# Patient Record
Sex: Male | Born: 1980
Health system: Southern US, Community
[De-identification: ages and names within clinical notes are randomized; demographics above are authoritative.]

## PROBLEM LIST (undated history)

## (undated) DIAGNOSIS — E8809 Other disorders of plasma-protein metabolism, not elsewhere classified: Secondary | ICD-10-CM

## (undated) DIAGNOSIS — T7840XA Allergy, unspecified, initial encounter: Secondary | ICD-10-CM

## (undated) DIAGNOSIS — G4730 Sleep apnea, unspecified: Secondary | ICD-10-CM

## (undated) DIAGNOSIS — D649 Anemia, unspecified: Secondary | ICD-10-CM

## (undated) DIAGNOSIS — R0602 Shortness of breath: Secondary | ICD-10-CM

## (undated) DIAGNOSIS — F1010 Alcohol abuse, uncomplicated: Secondary | ICD-10-CM

## (undated) DIAGNOSIS — K219 Gastro-esophageal reflux disease without esophagitis: Secondary | ICD-10-CM

## (undated) DIAGNOSIS — K760 Fatty (change of) liver, not elsewhere classified: Secondary | ICD-10-CM

## (undated) DIAGNOSIS — F419 Anxiety disorder, unspecified: Secondary | ICD-10-CM

## (undated) DIAGNOSIS — E612 Magnesium deficiency: Secondary | ICD-10-CM

## (undated) DIAGNOSIS — R002 Palpitations: Secondary | ICD-10-CM

## (undated) DIAGNOSIS — E559 Vitamin D deficiency, unspecified: Secondary | ICD-10-CM

## (undated) DIAGNOSIS — I499 Cardiac arrhythmia, unspecified: Secondary | ICD-10-CM

## (undated) HISTORY — DX: Anemia, unspecified: D64.9

## (undated) HISTORY — DX: Shortness of breath: R06.02

## (undated) HISTORY — DX: Vitamin D deficiency, unspecified: E55.9

## (undated) HISTORY — DX: Fatty (change of) liver, not elsewhere classified: K76.0

## (undated) HISTORY — DX: Sleep apnea, unspecified: G47.30

## (undated) HISTORY — DX: Anxiety disorder, unspecified: F41.9

## (undated) HISTORY — DX: Allergy, unspecified, initial encounter: T78.40XA

## (undated) HISTORY — PX: PILONIDAL CYST EXCISION: SHX744

## (undated) HISTORY — PX: ORCHIECTOMY: SHX2116

## (undated) HISTORY — DX: Palpitations: R00.2

## (undated) HISTORY — DX: Gastro-esophageal reflux disease without esophagitis: K21.9

## (undated) HISTORY — DX: Magnesium deficiency: E61.2

## (undated) HISTORY — DX: Alcohol abuse, uncomplicated: F10.10

## (undated) HISTORY — DX: Other disorders of plasma-protein metabolism, not elsewhere classified: E88.09

---

## 2006-04-24 ENCOUNTER — Ambulatory Visit: Payer: Self-pay | Admitting: Family Medicine

## 2006-08-29 ENCOUNTER — Emergency Department: Payer: Self-pay | Admitting: General Practice

## 2007-02-27 ENCOUNTER — Ambulatory Visit: Payer: Self-pay | Admitting: Internal Medicine

## 2007-02-27 DIAGNOSIS — J45909 Unspecified asthma, uncomplicated: Secondary | ICD-10-CM | POA: Insufficient documentation

## 2007-02-27 DIAGNOSIS — J309 Allergic rhinitis, unspecified: Secondary | ICD-10-CM | POA: Insufficient documentation

## 2007-02-28 LAB — CONVERTED CEMR LAB
ALT: 27 units/L (ref 0–53)
Alkaline Phosphatase: 57 units/L (ref 39–117)
Basophils Relative: 1.4 % — ABNORMAL HIGH (ref 0.0–1.0)
Chloride: 100 meq/L (ref 96–112)
Eosinophils Absolute: 0.1 10*3/uL (ref 0.0–0.6)
Eosinophils Relative: 1.5 % (ref 0.0–5.0)
GFR calc Af Amer: 132 mL/min
Glucose, Bld: 87 mg/dL (ref 70–99)
MCV: 94.2 fL (ref 78.0–100.0)
Platelets: 229 10*3/uL (ref 150–400)
RBC: 4.47 M/uL (ref 4.22–5.81)
Sodium: 134 meq/L — ABNORMAL LOW (ref 135–145)
Total Protein: 7.6 g/dL (ref 6.0–8.3)
WBC: 7.1 10*3/uL (ref 4.5–10.5)

## 2007-03-26 ENCOUNTER — Emergency Department: Payer: Self-pay | Admitting: Emergency Medicine

## 2007-03-29 ENCOUNTER — Ambulatory Visit: Payer: Self-pay | Admitting: Internal Medicine

## 2007-04-27 ENCOUNTER — Ambulatory Visit: Payer: Self-pay | Admitting: Family Medicine

## 2007-04-30 ENCOUNTER — Ambulatory Visit: Payer: Self-pay | Admitting: Internal Medicine

## 2007-04-30 DIAGNOSIS — F411 Generalized anxiety disorder: Secondary | ICD-10-CM | POA: Insufficient documentation

## 2007-05-01 ENCOUNTER — Encounter: Payer: Self-pay | Admitting: Internal Medicine

## 2007-06-29 ENCOUNTER — Ambulatory Visit: Payer: Self-pay | Admitting: Family Medicine

## 2007-10-30 ENCOUNTER — Ambulatory Visit: Payer: Self-pay | Admitting: Internal Medicine

## 2008-04-01 ENCOUNTER — Ambulatory Visit: Payer: Self-pay | Admitting: Family Medicine

## 2008-04-22 ENCOUNTER — Encounter: Payer: Self-pay | Admitting: Family Medicine

## 2008-05-06 ENCOUNTER — Ambulatory Visit: Payer: Self-pay | Admitting: Internal Medicine

## 2008-08-26 ENCOUNTER — Ambulatory Visit: Payer: Self-pay | Admitting: Family Medicine

## 2008-08-26 DIAGNOSIS — H6120 Impacted cerumen, unspecified ear: Secondary | ICD-10-CM | POA: Insufficient documentation

## 2009-10-06 ENCOUNTER — Ambulatory Visit: Payer: Self-pay | Admitting: Family Medicine

## 2009-12-24 ENCOUNTER — Ambulatory Visit: Payer: Self-pay | Admitting: Internal Medicine

## 2009-12-24 DIAGNOSIS — L723 Sebaceous cyst: Secondary | ICD-10-CM | POA: Insufficient documentation

## 2010-09-08 NOTE — Assessment & Plan Note (Signed)
Summary: CPX/CLE   Vital Signs:  Patient profile:   30 year old male Height:      74 inches Weight:      226 pounds Temp:     98.1 degrees F oral Pulse rate:   72 / minute Pulse rhythm:   regular BP sitting:   112 / 70  (left arm) Cuff size:   large  Vitals Entered By: Mervin Hack CMA Duncan Dull) (Dec 24, 2009 3:02 PM) CC: adult physical   History of Present Illness: Doing well For physical  Allegies have not been a problem Asthma quiet---hasn't used inhaler in years no limitations on exercise tolerance  Headaches have dissapated Only rarely will get a headache   Allergies: No Known Drug Allergies  Past History:  Past medical, surgical, family and social histories (including risk factors) reviewed for relevance to current acute and chronic problems.  Past Medical History: Reviewed history from 04/30/2007 and no changes required. Allergic rhinitis Asthma Anxiety GERD  Past Surgical History: Reviewed history from 02/27/2007 and no changes required. Pilonidal cyst excision 3 times---2000 & 2001 L orchiectomy and R orchiopexy for torsion   2002  Family History: Parents both with headaches--Dad with migraines Dad had trigeminal neuralgia Dad has celiac disease 1 sister Mat GF had PVD, HTN (died of CAD?) No DM Pat GM died of some cancer No prostate or colon cancer  Social History: Reviewed history from 02/27/2007 and no changes required. 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 Eyes:  Denies double vision and vision loss-1 eye. ENT:  Denies decreased hearing and ringing in ears; teeth fine--regular with dentist. CV:  Denies chest pain or discomfort, difficulty breathing at night, difficulty breathing while lying down, fainting, lightheadness, palpitations, and shortness of breath  with exertion. Resp:  Denies cough and shortness of breath. GI:  Denies abdominal pain, bloody stools, change in bowel habits, dark tarry stools, indigestion, nausea, and vomiting. GU:  Denies erectile dysfunction, urinary frequency, and urinary hesitancy. MS:  Denies joint pain and joint swelling. Derm:  Complains of lesion(s); denies rash; has slowly enlarging "lump" along right flank. Has been there for many years. Neuro:  Denies headaches, numbness, tingling, and weakness. Psych:  Denies anxiety and depression. Endo:  Denies excessive thirst and excessive urination. Heme:  Denies abnormal bruising and enlarge lymph nodes. Allergy:  Denies seasonal allergies and sneezing; allergies have been quiet.  Physical Exam  General:  alert and normal appearance.   Eyes:  pupils equal, pupils round, pupils reactive to light, and no optic disk abnormalities.   Ears:  Cerumen bilaterally Mouth:  no erythema and no exudates.   Neck:  supple, no masses, no thyromegaly, no carotid bruits, and no cervical lymphadenopathy.   Lungs:  normal respiratory effort and normal breath sounds.   Heart:  normal rate, regular rhythm, no murmur, and no gallop.   Abdomen:  soft and non-tender.   Genitalia:  left testes absent  right is normal Msk:  no joint tenderness and no joint swelling.   Pulses:  normal in feet Extremities:  no edema Neurologic:  alert & oriented X3, strength normal in all extremities, and gait normal.   Skin:  no suspicious lesions and no ulcerations.   raised cyst ( ~2cm in diameter) along right flank. Slight redness at  point with crust but no infeciton Axillary  Nodes:  No palpable lymphadenopathy Psych:  normally interactive, good eye contact, not anxious appearing, and not depressed appearing.     Impression & Recommendations:  Problem # 1:  PREVENTIVE HEALTH CARE (ICD-V70.0) Assessment Comment Only healthy needs to pay more attention to fitness again Will check tetanus  status--not excited about getting it asthma and allergies quiet  Problem # 2:  SEBACEOUS CYST (ICD-706.2) Assessment: Comment Only discussed hot packing to drain if it gets infected can schedule for excision if it bothers him  Patient Instructions: 1)  Please check on your tetanus status. If you are not sure, or if it has been over 10 years, please set up to come in for Tdap. 2)  Please schedule a follow-up appointment in 2-3 years for a physical  Prior Medications: None Current Allergies (reviewed today): No known allergies

## 2010-09-08 NOTE — Assessment & Plan Note (Signed)
Summary: ears stopped up/ alc   Vital Signs:  Patient profile:   30 year old male Height:      74 inches Weight:      226.6 pounds BMI:     29.20 Temp:     98.0 degrees F oral Pulse rate:   80 / minute Pulse rhythm:   regular BP sitting:   110 / 80  (left arm) Cuff size:   large  Vitals Entered By: Benny Lennert CMA Duncan Dull) (October 06, 2009 11:42 AM)  History of Present Illness: Chief complaint ears stopped up  B ears full...no URI symptoms, no SOB, no headache Decrease hearing B ears. Cerumen impaction last year this time. Irrigation helped.  not using Q-tips.   Allergies (verified): No Known Drug Allergies PMH-FH-SH reviewed-no changes except otherwise noted  Review of Systems General:  Denies fatigue and fever.  Physical Exam  General:  Well-developed,well-nourished,in no acute distress; alert,appropriate and cooperative throughout examination Ears:  B cerumen...after irrigation TMs B clear, ext canals clear Nose:  External nasal examination shows no deformity or inflammation. Nasal mucosa are pink and moist without lesions or exudates. Mouth:  MMM   Impression & Recommendations:  Problem # 1:  CERUMEN IMPACTION, BILATERAL (ICD-380.4) Discussed cerumen impaction prevention.   Prior Medications (reviewed today): None Current Allergies (reviewed today): No known allergies

## 2011-02-15 ENCOUNTER — Ambulatory Visit (INDEPENDENT_AMBULATORY_CARE_PROVIDER_SITE_OTHER): Payer: BC Managed Care – PPO | Admitting: Internal Medicine

## 2011-02-15 ENCOUNTER — Encounter: Payer: Self-pay | Admitting: Internal Medicine

## 2011-02-15 VITALS — BP 106/60 | HR 60 | Temp 97.8°F | Ht 74.0 in | Wt 182.0 lb

## 2011-02-15 DIAGNOSIS — M25519 Pain in unspecified shoulder: Secondary | ICD-10-CM | POA: Insufficient documentation

## 2011-02-15 NOTE — Assessment & Plan Note (Signed)
Probably did some strain working with the trainer Discussed that no serious findings like impingement or apparent rotator cuff damage are present Pain with stretching may be keeping him from further ROM but should try to cater stretching to prevent worst of pain Probably needs to limit shoulder weights--he will likely never be able to build up a lot more Advised appt with Dr Patsy Lager if ongoing problems

## 2011-02-15 NOTE — Progress Notes (Signed)
  Subjective:    Patient ID: William Stuart, male    DOB: 01/05/1981, 30 y.o.   MRN: [id number]  HPI Having shoulder problems Wonders about injury and whether he needs MRI Left >>R problems Several months of symptoms (3) Has been working out with trainer---lifting more with shoulders than he ever did before Has lightened up on the weights Some stiffness on left, slight on right---feels he lacks flexibility  Shooting pains in gym when stretching --then will often loosen up No apparent swelling  Can feel it when he does regular lifting (like groceries)  Hasn't used any meds Has just tried various stretches  No current outpatient prescriptions on file prior to visit.    No Known Allergies  Past Medical History  Diagnosis Date  . Allergy   . Asthma   . Anxiety   . GERD (gastroesophageal reflux disease)     Past Surgical History  Procedure Date  . Pilonidal cyst excision 2000 & 2001    3 times  . Orchiectomy     L orchiectomy and R orchiopexy for torsion   2002    Family History  Problem Relation Age of Onset  . Migraines Mother   . Migraines Father   . Cancer Neg Hx     History   Social History  . Marital Status: Married    Spouse Name: N/A    Number of Children: N/A  . Years of Education: N/A   Occupational History  . 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 Smoking status: Never Smoker   . Smokeless tobacco: Never Used  . Alcohol Use: Yes  . Drug Use: No  . Sexually Active: Not on file   Other Topics Concern  . Not on file   Social History Narrative  . No narrative on file   Review of Systems No muscle weakness No joint pains otherwise     Objective:   Physical Exam  Constitutional: He appears well-developed and well-nourished. No distress.  Musculoskeletal:       Fairly normal ROM in both shoulders Mild crepitus on left abd and ext rotation are low normal bilat   Neurological:         Equal fair strength in both shoulders --resist adduction fairly well          Assessment & Plan:

## 2011-05-24 ENCOUNTER — Encounter: Payer: Self-pay | Admitting: Internal Medicine

## 2011-05-24 ENCOUNTER — Ambulatory Visit (INDEPENDENT_AMBULATORY_CARE_PROVIDER_SITE_OTHER): Payer: BC Managed Care – PPO | Admitting: Internal Medicine

## 2011-05-24 DIAGNOSIS — R1900 Intra-abdominal and pelvic swelling, mass and lump, unspecified site: Secondary | ICD-10-CM

## 2011-05-24 NOTE — Progress Notes (Signed)
  Subjective:    Patient ID: William Stuart, male    DOB: January 07, 1981, 30 y.o.   MRN: [id number]  HPI Has noticed 2 days ago---a lump under his belly button No pain but a little tender with pushing No apparent bulging out  No abdominal pain Appetite is good Bowels are fine  No current outpatient prescriptions on file prior to visit.    No Known Allergies  Past Medical History  Diagnosis Date  . Allergy   . Asthma   . Anxiety   . GERD (gastroesophageal reflux disease)     Past Surgical History  Procedure Date  . Pilonidal cyst excision 2000 & 2001    3 times  . Orchiectomy     L orchiectomy and R orchiopexy for torsion   2002    Family History  Problem Relation Age of Onset  . Migraines Mother   . Migraines Father   . Cancer Neg Hx     History   Social History  . Marital Status: Married    Spouse Name: N/A    Number of Children: N/A  . Years of Education: N/A   Occupational History  . 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 Smoking status: Never Smoker   . Smokeless tobacco: Never Used  . Alcohol Use: Yes  . Drug Use: No  . Sexually Active: Not on file   Other Topics Concern  . Not on file   Social History Narrative  . No narrative on file   Review of Systems Still working out regularly Shoulder pain is better     Objective:   Physical Exam  Constitutional: He appears well-developed and well-nourished. No distress.  Neck: Normal range of motion. Neck supple. No thyromegaly present.  Cardiovascular: Normal rate, regular rhythm and normal heart sounds.   No murmur heard. Pulmonary/Chest: Effort normal and breath sounds normal. No respiratory distress. He has no wheezes. He has no rales.  Abdominal: Soft. Bowel sounds are normal. He exhibits no distension and no mass. There is no tenderness. There is no rebound and no guarding.       Rectus muscles together in midline when upright When  supine--no hernia or apparent mass No inflammation or redness  Lymphadenopathy:    He has no cervical adenopathy.          Assessment & Plan:

## 2011-05-24 NOTE — Assessment & Plan Note (Signed)
No clear finding No umbilical hernia or infection  Reassured  Observation only

## 2011-09-15 ENCOUNTER — Ambulatory Visit: Payer: BC Managed Care – PPO | Admitting: Internal Medicine

## 2011-09-29 ENCOUNTER — Ambulatory Visit: Payer: BC Managed Care – PPO | Admitting: Family Medicine

## 2011-09-29 ENCOUNTER — Encounter: Payer: Self-pay | Admitting: Internal Medicine

## 2011-09-29 ENCOUNTER — Ambulatory Visit (INDEPENDENT_AMBULATORY_CARE_PROVIDER_SITE_OTHER): Payer: BC Managed Care – PPO | Admitting: Internal Medicine

## 2011-09-29 DIAGNOSIS — H6120 Impacted cerumen, unspecified ear: Secondary | ICD-10-CM

## 2011-09-29 NOTE — Assessment & Plan Note (Signed)
Hearing is better with the cleaning that has been done Discussed using peroxide solution nightly for a few nights then warm water flush

## 2011-09-29 NOTE — Progress Notes (Signed)
  Subjective:    Patient ID: William Stuart, male    DOB: 1981-07-29, 31 y.o.   MRN: [id number]  HPI Felt his ears filled up---esp left ear Goes back 3-4 days Right is milder No pain No URI now---may have had flu earlier in month (fever, cough, chills) No fever  No current outpatient prescriptions on file prior to visit.    No Known Allergies  Past Medical History  Diagnosis Date  . Allergy   . Asthma   . Anxiety   . GERD (gastroesophageal reflux disease)     Past Surgical History  Procedure Date  . Pilonidal cyst excision 2000 & 2001    3 times  . Orchiectomy     L orchiectomy and R orchiopexy for torsion   2002    Family History  Problem Relation Age of Onset  . Migraines Mother   . Migraines Father   . Cancer Neg Hx     History   Social History  . Marital Status: Married    Spouse Name: N/A    Number of Children: N/A  . Years of Education: N/A   Occupational History  . 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 Smoking status: Never Smoker   . Smokeless tobacco: Never Used  . Alcohol Use: Yes  . Drug Use: No  . Sexually Active: Not on file   Other Topics Concern  . Not on file   Social History Narrative  . No narrative on file     Review of Systems No vomiting  No tooth pain Appetite fine     Objective:   Physical Exam  Constitutional: He appears well-developed and well-nourished. No distress.  HENT:  Mouth/Throat: Oropharynx is clear and moist. No oropharyngeal exudate.       No tragal tenderness Canals are full of cerumen ~75%clearing with lavage but cerumen still present bilaterally at TM  Neck: Normal range of motion.  Lymphadenopathy:    He has no cervical adenopathy.          Assessment & Plan:

## 2013-01-24 ENCOUNTER — Ambulatory Visit (INDEPENDENT_AMBULATORY_CARE_PROVIDER_SITE_OTHER): Payer: BC Managed Care – PPO | Admitting: Family Medicine

## 2013-01-24 ENCOUNTER — Encounter: Payer: Self-pay | Admitting: Family Medicine

## 2013-01-24 VITALS — BP 108/68 | HR 70 | Temp 97.8°F | Wt 207.5 lb

## 2013-01-24 DIAGNOSIS — F411 Generalized anxiety disorder: Secondary | ICD-10-CM

## 2013-01-24 MED ORDER — ALPRAZOLAM 0.25 MG PO TABS: 0.2500 mg | ORAL_TABLET | Freq: Two times a day (BID) | ORAL | Status: DC | PRN

## 2013-01-24 NOTE — Patient Instructions (Addendum)
Drink plenty of water and use the xanax if needed.  Give [place] an update in a few days.  Take care.

## 2013-01-24 NOTE — Progress Notes (Signed)
Was at work two days ago.  Felt a shortness of breath w/o clear trigger.  Lasted through yesterday (about half of yesterday he felt well) and some today.  Sx can come on during sleep at night, waking up with it.  No FCNAVD. No vision loss.  No cough.  No syncope. No speech changes. No weakness x4.  No rash.  No wheeze.  H/o asthma, not on inhalers now.  This may be similar to feeling of asthma, except for the lightheaded feeling.  No vertigo.  Work is going well, lighter load during the summer.  Married, new baby on the way in 7/14.  No acute chest pain.  H/o anxiety prev.  Was prev on xanax, used rarely.  Had been exercising recently, with good exercise tolerance.  He gets a little lightheaded with position changes.    He had had similar prev, when his weight was much higher, ~270 lbs prev.  He feels "okay" now, not as SOB now.    Meds, vitals, and allergies reviewed.   ROS: See HPI.  Otherwise, noncontributory.  GEN: nad, alert and oriented HEENT: mucous membranes moist NECK: supple w/o LA CV: rrr.  no murmur PULM: ctab, no inc wob ABD: soft, +bs EXT: no edema SKIN: no acute rash Not orthostatic on position change in clinic

## 2013-01-25 NOTE — Assessment & Plan Note (Signed)
Likely cause.  He has no h/o or FH or CAD.  Nontoxic.  No typical CP. Totally normal exam.  No sign of cardiopulmonary disease.  Noted pending birth of first child. Would try short course of BZD with sedation caution and then f/u is sx persist.  He agrees. ddx dw pt.

## 2013-06-18 ENCOUNTER — Encounter: Payer: Self-pay | Admitting: Internal Medicine

## 2013-06-18 ENCOUNTER — Telehealth: Payer: Self-pay

## 2013-06-18 ENCOUNTER — Ambulatory Visit (INDEPENDENT_AMBULATORY_CARE_PROVIDER_SITE_OTHER): Payer: BC Managed Care – PPO | Admitting: Internal Medicine

## 2013-06-18 VITALS — BP 122/80 | HR 56 | Temp 98.6°F | Wt 215.0 lb

## 2013-06-18 DIAGNOSIS — H6123 Impacted cerumen, bilateral: Secondary | ICD-10-CM | POA: Insufficient documentation

## 2013-06-18 DIAGNOSIS — R209 Unspecified disturbances of skin sensation: Secondary | ICD-10-CM

## 2013-06-18 DIAGNOSIS — H6120 Impacted cerumen, unspecified ear: Secondary | ICD-10-CM

## 2013-06-18 DIAGNOSIS — R200 Anesthesia of skin: Secondary | ICD-10-CM | POA: Insufficient documentation

## 2013-06-18 LAB — CBC WITH DIFFERENTIAL/PLATELET
Basophils Absolute: 0 10*3/uL (ref 0.0–0.1)
Eosinophils Absolute: 0.1 10*3/uL (ref 0.0–0.7)
HCT: 41.1 % (ref 39.0–52.0)
Hemoglobin: 14 g/dL (ref 13.0–17.0)
Lymphs Abs: 2.3 10*3/uL (ref 0.7–4.0)
MCHC: 34 g/dL (ref 30.0–36.0)
MCV: 95.2 fl (ref 78.0–100.0)
Neutro Abs: 3.2 10*3/uL (ref 1.4–7.7)
RDW: 12.5 % (ref 11.5–14.6)

## 2013-06-18 LAB — BASIC METABOLIC PANEL
CO2: 29 mEq/L (ref 19–32)
Chloride: 102 mEq/L (ref 96–112)
Glucose, Bld: 83 mg/dL (ref 70–99)
Potassium: 4 mEq/L (ref 3.5–5.1)
Sodium: 137 mEq/L (ref 135–145)

## 2013-06-18 LAB — HEPATIC FUNCTION PANEL
ALT: 15 U/L (ref 0–53)
Albumin: 4.3 g/dL (ref 3.5–5.2)
Total Protein: 7.8 g/dL (ref 6.0–8.3)

## 2013-06-18 NOTE — Telephone Encounter (Signed)
Pt left v/m was seen today and pt was asked what he had to eat prior to lab testing; pt said he forgot he had a granola bar at 11:45 and wanted Dr Alphonsus Sias to be aware.

## 2013-06-18 NOTE — Assessment & Plan Note (Signed)
May be due to compression with shoe Will just check labs to be sure

## 2013-06-18 NOTE — Assessment & Plan Note (Signed)
Flushed while here

## 2013-06-18 NOTE — Progress Notes (Signed)
  Subjective:    Patient ID: William Stuart, male    DOB: 10/11/1980, 32 y.o.   MRN: [id number]  HPI 4-5 days ago--awoke with numbness on lateral left foot and ankle Has abnormal sense when he touches it Still present but less noticeable No symptoms in leg above that. No back injuries or problems No other abnormal sensation No weakness  Also has some head congestion Slight lightheaded feeling Ears are clogged again Hearing is muffled Not really sick Does have some fall allergies-- usually doesn't take meds  No current outpatient prescriptions on file prior to visit.   No current facility-administered medications on file prior to visit.    No Known Allergies  Past Medical History  Diagnosis Date  . Allergy   . Asthma   . Anxiety   . GERD (gastroesophageal reflux disease)     Past Surgical History  Procedure Laterality Date  . Pilonidal cyst excision  2000 & 2001    3 times  . Orchiectomy      L orchiectomy and R orchiopexy for torsion   2002    Family History  Problem Relation Age of Onset  . Migraines Mother   . Migraines Father   . Cancer Neg Hx     History   Social History  . Marital Status: Married    Spouse Name: N/A    Number of Children: 1  . Years of Education: N/A   Occupational History  . 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 Smoking status: Never Smoker   . Smokeless tobacco: Never Used  . Alcohol Use: Yes  . Drug Use: No  . Sexual Activity: Not on file   Other Topics Concern  . Not on file   Social History Narrative  . No narrative on file   Review of Systems No fevers No bladder or bowel function Sleeps okay    Objective:   Physical Exam  Constitutional: He appears well-developed and well-nourished. No distress.  HENT:  No sinus tenderness Mild pale nasal congestion TMs blocked by cerumen bilaterally  Neck: Normal range of motion. Neck supple. No thyromegaly  present.  Pulmonary/Chest: Effort normal and breath sounds normal. No respiratory distress. He has no wheezes. He has no rales.  Lymphadenopathy:    He has no cervical adenopathy.  Neurological:  Mild difference in sensation on lateral left foot No other abnormal areas          Assessment & Plan:

## 2013-06-19 ENCOUNTER — Encounter: Payer: Self-pay | Admitting: Internal Medicine

## 2013-06-19 NOTE — Telephone Encounter (Signed)
I will keep that in mind if his glucose is elevated

## 2013-06-20 ENCOUNTER — Encounter: Payer: Self-pay | Admitting: Internal Medicine

## 2013-06-21 ENCOUNTER — Other Ambulatory Visit: Payer: Self-pay | Admitting: Internal Medicine

## 2013-06-21 DIAGNOSIS — R200 Anesthesia of skin: Secondary | ICD-10-CM

## 2014-05-23 ENCOUNTER — Other Ambulatory Visit: Payer: Self-pay

## 2014-08-05 ENCOUNTER — Encounter: Payer: Self-pay | Admitting: Internal Medicine

## 2014-08-05 ENCOUNTER — Ambulatory Visit (INDEPENDENT_AMBULATORY_CARE_PROVIDER_SITE_OTHER): Payer: BC Managed Care – PPO | Admitting: Internal Medicine

## 2014-08-05 VITALS — BP 120/80 | HR 81 | Temp 98.6°F | Wt 243.0 lb

## 2014-08-05 DIAGNOSIS — R51 Headache: Secondary | ICD-10-CM

## 2014-08-05 DIAGNOSIS — R519 Headache, unspecified: Secondary | ICD-10-CM | POA: Insufficient documentation

## 2014-08-05 NOTE — Patient Instructions (Signed)
Please try over the counter loratadine 10mg  1-2 daily, or once a day fexofenadine 180mg  or cetirizine 10mg .

## 2014-08-05 NOTE — Progress Notes (Signed)
   Subjective:    Patient ID: William Stuart, male    DOB: 08-15-1980, 33 y.o.   MRN: [id number]  HPI Here for evaluation of head pain  Awoke about 10 days ago with pressure in head Jumped up and got dizzy Mostly in frontal area and face Has recurred intermittently since then  Doesn't really feel sick Slight stuffiness No visual changes No sore throat Some pain in ears  Not clearly like past allergy symptoms Hasn't tried any meds lately--- did take 2 excedrin at first  No current outpatient prescriptions on file prior to visit.   No current facility-administered medications on file prior to visit.    No Known Allergies  Past Medical History  Diagnosis Date  . Allergy   . Asthma   . Anxiety   . GERD (gastroesophageal reflux disease)     Past Surgical History  Procedure Laterality Date  . Pilonidal cyst excision  2000 & 2001    3 times  . Orchiectomy      L orchiectomy and R orchiopexy for torsion   2002    Family History  Problem Relation Age of Onset  . Migraines Mother   . Migraines Father   . Cancer Neg Hx     History   Social History  . Marital Status: Married    Spouse Name: N/A    Number of Children: 1  . Years of Education: N/A   Occupational History  . 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 Smoking status: Never Smoker   . Smokeless tobacco: Never Used  . Alcohol Use: Yes  . Drug Use: No  . Sexual Activity: Not on file   Other Topics Concern  . Not on file   Social History Narrative   Review of Systems No focal weakness, aphasia or swallowing problems No fever Has gained 28# in past year--- stopped going back to gym    Objective:   Physical Exam  Constitutional: He is oriented to person, place, and time. He appears well-developed and well-nourished. No distress.  HENT:  Mouth/Throat: Oropharynx is clear and moist. No oropharyngeal exudate.  Cerumen in both  canals--TMs okay Mild nasal inflammation biilaterally  Eyes: Conjunctivae and EOM are normal. Pupils are equal, round, and reactive to light.  Fundi normal  Neck: Normal range of motion. Neck supple. No thyromegaly present.  Lymphadenopathy:    He has no cervical adenopathy.  Neurological: He is alert and oriented to person, place, and time. No cranial nerve deficit. He exhibits normal muscle tone. Coordination normal.  Normal gait          Assessment & Plan:

## 2014-08-05 NOTE — Assessment & Plan Note (Signed)
Neurologic exam is normal No evidence of intracranial process--so reassured him about this ?sinus pressure Doesn't seem to have infection Likely allergies with unseasonable warm weather Will try OTC antihistamines for now No imaging or further testing indicated

## 2014-08-27 ENCOUNTER — Ambulatory Visit: Payer: Self-pay | Admitting: Medical

## 2014-10-10 ENCOUNTER — Telehealth: Payer: Self-pay

## 2014-10-10 NOTE — Telephone Encounter (Signed)
Patient informed of normal lab results per Dr Alphonsus SiasLetvak.

## 2015-02-27 ENCOUNTER — Encounter: Payer: Self-pay | Admitting: Family Medicine

## 2015-02-27 ENCOUNTER — Ambulatory Visit (INDEPENDENT_AMBULATORY_CARE_PROVIDER_SITE_OTHER): Payer: BLUE CROSS/BLUE SHIELD | Admitting: Family Medicine

## 2015-02-27 VITALS — BP 106/70 | HR 60 | Temp 98.3°F | Ht 73.5 in | Wt 248.5 lb

## 2015-02-27 DIAGNOSIS — L290 Pruritus ani: Secondary | ICD-10-CM

## 2015-02-27 DIAGNOSIS — K6289 Other specified diseases of anus and rectum: Secondary | ICD-10-CM | POA: Diagnosis not present

## 2015-02-27 MED ORDER — HYDROCORTISONE ACE-PRAMOXINE 2.5-1 % RE CREA: 1.0000 "application " | TOPICAL_CREAM | Freq: Three times a day (TID) | RECTAL | Status: DC

## 2015-02-27 NOTE — Progress Notes (Signed)
Pre visit review using our clinic review tool, if applicable. No additional management support is needed unless otherwise documented below in the visit note. 

## 2015-02-27 NOTE — Patient Instructions (Addendum)
Keep area as dry as possible  Avoid irritant foods. Start topical steroid cream daily. Also use barrier cream after wiping with BM. Do not apply anyhting else to area. If not improving as expected .Marland Kitchen Refer to GI for further eval. .

## 2015-02-27 NOTE — Progress Notes (Signed)
   Subjective:    Patient ID: William Stuart, male    DOB: [birth year], 34 y.o.   MRN: [id number]  HPI  34 year old male presents with possible pilonidal cyst and anal itching.  He reports he has had a history of pilonidal cyst , excised in past x 3, last time was  Early 2000s.  Has noted in last 3 months.. He notes has moistness at anal opening, ?stool leakage and occ yellow discharge.   If not able to keep area dry he has offf and on rash at anus and gluteal crease.  In last few weeks anal itching, cannot sleep.   No pain with BMs. No pain overall area of past pilonidal cyst. This seems different than pilonidal cyst in past.  No blood in stool. No constipation, no diarrhea  No fever.   Social History /Family History/Past Medical History reviewed and updated if needed. Mother with history of Crohn's Review of Systems  Constitutional: Negative for fever and fatigue.  HENT: Negative for ear pain.   Eyes: Negative for pain.  Respiratory: Negative for cough and shortness of breath.   Cardiovascular: Negative for chest pain, palpitations and leg swelling.  Gastrointestinal: Negative for abdominal pain.  Genitourinary: Negative for dysuria.  Musculoskeletal: Negative for arthralgias.  Neurological: Negative for syncope, light-headedness and headaches.  Psychiatric/Behavioral: Negative for dysphoric mood.       Objective:   Physical Exam  Constitutional: Vital signs are normal. He appears well-developed and well-nourished.  HENT:  Head: Normocephalic.  Right Ear: Hearing normal.  Left Ear: Hearing normal.  Nose: Nose normal.  Mouth/Throat: Oropharynx is clear and moist and mucous membranes are normal.  Neck: Trachea normal. Carotid bruit is not present. No thyroid mass and no thyromegaly present.  Cardiovascular: Normal rate, regular rhythm and normal pulses.  Exam reveals no gallop, no distant heart sounds and no friction rub.   No murmur heard. No peripheral edema    Pulmonary/Chest: Effort normal and breath sounds normal. No respiratory distress.  Abdominal: Normal appearance and bowel sounds are normal. There is no hepatosplenomegaly. There is no tenderness. There is no rebound and no CVA tenderness.  Genitourinary: Rectal exam shows tenderness. Rectal exam shows no external hemorrhoid, no internal hemorrhoid, no fissure, no mass and anal tone normal. Guaiac negative stool.  Anoscopy performed, pt significantly uncomfortable with exam. Erythema of rectal tissue  Skin: Skin is warm, dry and intact. No rash noted.  Old scar noted. No pilonidal cyst  Psychiatric: He has a normal mood and affect. His speech is normal and behavior is normal. Thought content normal.         Assessment & Plan:   Anal pain and anal itching: No hemorrhoids or mass in rectum.  Significant inflammation, anal pruritis.  Will treat with topical steroid, keep area dry and use barrier cream. Discuss with pt possibility of IBS with family history .Marland Kitchen Consider referral to GI if not improving as expected.

## 2015-04-23 ENCOUNTER — Telehealth: Payer: Self-pay | Admitting: Internal Medicine

## 2015-04-23 NOTE — Telephone Encounter (Signed)
Patient Name: William Stuart DOB: 1981-04-24 Initial Comment caller states he has irregular heart beat Nurse Assessment Nurse: Charna Elizabeth, RN, Lynden Ang Date/Time (Eastern Time): 04/23/2015 10:01:48 AM Confirm and document reason for call. If symptomatic, describe symptoms. ---Caller states he developed irregular heart rhythms again about 3 days ago. No severe breathing difficulty. No injury in the past 5 days. No fever. Has the patient traveled out of the country within the last 30 days? ---No Does the patient require triage? ---Yes Related visit to physician within the last 2 weeks? ---No Does the PT have any chronic conditions? (i.e. diabetes, asthma, etc.) ---Yes List chronic conditions. ---Irregular Heart Rhythms, Asthma (no wheezing) Guidelines Guideline Title Affirmed Question Affirmed Notes Heart Rate and Heartbeat Questions Dizziness, lightheadedness, or weakness Final Disposition User Go to ED Now Charna Elizabeth, RN, [employer] Referrals [facility] - ED Disagree/Comply: Comply

## 2015-04-23 NOTE — Telephone Encounter (Signed)
This sounds like something at his age that can be evaluated in office. Find out if he has already gone to ER---could add at 4:45 if needed (unless he is having CP or sig SOB)

## 2015-04-23 NOTE — Telephone Encounter (Signed)
.  left message to have patient return my call.  

## 2015-04-23 NOTE — Telephone Encounter (Signed)
That sounds fine

## 2015-04-23 NOTE — Telephone Encounter (Signed)
Spoke with patient and he's ok, he's had this before and was sent to [facility] cardiology by the PA at [facility], he had full workup with Dr. Juliann Pares, wore a Holter monitor, EKG, ECHO and Exercise Stress Test (notes in care everywhere) and they found nothing wrong. Per pt he had a episode last week and then last night but he went to the gym this morning with no problem. I scheduled pt appt tomorrow at 9:15 but did advise him if anything changes before tomorrow to seek emergency care.

## 2015-04-24 ENCOUNTER — Ambulatory Visit (INDEPENDENT_AMBULATORY_CARE_PROVIDER_SITE_OTHER): Payer: BLUE CROSS/BLUE SHIELD | Admitting: Internal Medicine

## 2015-04-24 ENCOUNTER — Encounter: Payer: Self-pay | Admitting: Internal Medicine

## 2015-04-24 VITALS — BP 112/70 | HR 80 | Temp 98.0°F | Wt 242.0 lb

## 2015-04-24 DIAGNOSIS — R002 Palpitations: Secondary | ICD-10-CM | POA: Diagnosis not present

## 2015-04-24 NOTE — Progress Notes (Signed)
Pre visit review using our clinic review tool, if applicable. No additional management support is needed unless otherwise documented below in the visit note. 

## 2015-04-24 NOTE — Assessment & Plan Note (Signed)
Reviewed recent cardiac work up Echo normal--- stress test and holter reports not available (but reportedly negative) No apparent tachycardia No exercise symptoms EKG normal  Reassured--- seems to be isolated PVCs If very symptomatic, can try low dose beta blocker

## 2015-04-24 NOTE — Progress Notes (Signed)
   Subjective:    Patient ID: William Stuart, male    DOB: [birth year], 34 y.o.   MRN: [id number]  HPI Here due to irregular heart beat  About 11 days ago, having increased skipped beats Will feel in his throat Okay when going to the gym  Went to PA at [place] over the summer Sent to Dr Juliann Pares Had stress test, echo and 24 hour monitor--all reassuring Told to cut back on caffeine  Did go back to caffeine May be related to recent flare Tried cutting back last week--but hasn't really gone away  No pounding Just feels an extra beat Slight labored breathing at gym this morning Doesn't get the extra beats with exercise  No current outpatient prescriptions on file prior to visit.   No current facility-administered medications on file prior to visit.    No Known Allergies  Past Medical History  Diagnosis Date  . Allergy   . Asthma   . Anxiety   . GERD (gastroesophageal reflux disease)     Past Surgical History  Procedure Laterality Date  . Pilonidal cyst excision  2000 & 2001    3 times  . Orchiectomy      L orchiectomy and R orchiopexy for torsion   2002    Family History  Problem Relation Age of Onset  . Migraines Mother   . Migraines Father   . Cancer Neg Hx     Social History   Social History  . Marital Status: Married    Spouse Name: N/A  . Number of Children: 2  . Years of Education: N/A   Occupational History  . 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 Smoking status: Never Smoker   . Smokeless tobacco: Never Used  . Alcohol Use: Yes  . Drug Use: No  . Sexual Activity: Not on file   Other Topics Concern  . Not on file   Social History Narrative   Review of Systems  Sleeps isn't great. Recent sleep study benign No emotional issues Still likes job but more stress     Objective:   Physical Exam  Constitutional: He appears well-developed and well-nourished. No distress.  Neck:  Normal range of motion. Neck supple. No thyromegaly present.  Cardiovascular: Normal rate, regular rhythm and normal heart sounds.  Exam reveals no gallop.   No murmur heard. Pulmonary/Chest: Effort normal and breath sounds normal. No respiratory distress. He has no wheezes. He has no rales.  Abdominal: Soft. There is no tenderness.  Musculoskeletal: He exhibits no edema or tenderness.  Lymphadenopathy:    He has no cervical adenopathy.  Psychiatric: He has a normal mood and affect. His behavior is normal.          Assessment & Plan:

## 2015-09-07 ENCOUNTER — Ambulatory Visit: Payer: BLUE CROSS/BLUE SHIELD | Attending: Medical

## 2015-09-07 DIAGNOSIS — M545 Low back pain: Secondary | ICD-10-CM | POA: Insufficient documentation

## 2015-09-07 DIAGNOSIS — M5432 Sciatica, left side: Secondary | ICD-10-CM | POA: Diagnosis not present

## 2015-09-07 DIAGNOSIS — G8929 Other chronic pain: Secondary | ICD-10-CM | POA: Diagnosis present

## 2015-09-07 DIAGNOSIS — R531 Weakness: Secondary | ICD-10-CM | POA: Diagnosis present

## 2015-09-07 NOTE — Therapy (Signed)
[facility]ORTS MEDICINE [address], [place], [address] Phone: [phone]   Fax:  [phone]  Physical Therapy Evaluation  Patient Details  Name: William Stuart MRN: [id number] Date of Birth: Dec 20, 1980 Referring Provider: Ellie Lunch PA-C  Encounter Date: 09/07/2015      PT End of Session - 09/07/15 0905    Visit Number 1   Number of Visits 9   Date for PT Re-Evaluation 10/08/15   PT Start Time 0905   PT Stop Time 1019   PT Time Calculation (min) 74 min   Activity Tolerance Patient tolerated treatment well   Behavior During Therapy [facility] for tasks assessed/performed      Past Medical History  Diagnosis Date  . Allergy   . Asthma   . Anxiety   . GERD (gastroesophageal reflux disease)     Past Surgical History  Procedure Laterality Date  . Pilonidal cyst excision  2000 & 2001    3 times  . Orchiectomy      L orchiectomy and R orchiopexy for torsion   2002    There were no vitals filed for this visit.  Visit Diagnosis:  Sciatica of left side - Plan: PT plan of care cert/re-cert  Chronic left-sided low back pain, with sciatica presence unspecified - Plan: PT plan of care cert/re-cert  Weakness - Plan: PT plan of care cert/re-cert      Subjective Assessment - 09/07/15 0910    Subjective Back pain: 0/10 current and best (pt sitting), 3/10 at worst for the past month (after sitting for a while). L LE pain 3/10 constant dull pain currently; 0/10 at best (when pt standing at work); 8/10 at worst for that past month (getting out of the car).    Pertinent History Patient states that he has been having back pain for a while (about a year). Back pain increased during February to March 2016. Pt however felt L LE symptoms November 2016 after sitting in his car driving to [place]. Felt back pain, did exercises such as standing forward flexion at the hips which did not help.  Feels a muscle spasm in his L  glute, hamstrings and stabbing sensation in his calf muscle. Got a standing desk at work which helps his back. Sitting for 30 min to an hour increases his L LE symptoms (dull pain). Pain used to be in L lower back but feels as if it has migrated to his L LE. Denies tingling or numbness, or bowel or bladder problems. Had an x-ray for his back 07/2015 which revealed some narrowing in his  lumbar vertebrae.  Pt goes to crossfit. Performed clean snatches, shoulder presses, push press, dead lifts, rowing, sit ups, exercises involving squats (squats increases symptoms).    Patient Stated Goals "get back to the gym, get onto the floor with kids"   Currently in Pain? Yes   Pain Score --  please see subjectives   Aggravating Factors  sitting and bending, squats, using the row machine   Multiple Pain Sites Yes  low back and L LE     Objectives:  There-ex  Directed patient with prone on elbows with bilateral hip IR x 3 minutes with deep breaths to promote gentle lumbar extension  Prone glute max/quad sets 10x5 seconds each LE (some L posterior hip/proximal thigh discomfort)  Sitting with lumbar towel roll x 2 min (decreased L LE symptoms)  Reviewed HEP. Please see pt instructions. Pt demonstrated and verbalized  understanding.    Improved exercise technique, movement at target joints, use of target muscles after mod verbal, visual, tactile cues.         [facility] PT Assessment - 09/07/15 0923    Assessment   Medical Diagnosis L sciatica, low back pain   Referring Provider Ellie Lunch PA-C   Onset Date/Surgical Date 08/18/15  Date PT referral signed.   Prior Therapy No known formal PT for current condition.    Precautions   Precaution Comments No known precautions   Restrictions   Other Position/Activity Restrictions No known restrictions   Balance Screen   Has the patient fallen in the past 6 months No   Has the patient had a decrease in activity level because of a fear of falling?  No    Is the patient reluctant to leave their home because of a fear of falling?  No   Prior Function   Vocation Full time Production designer, theatre/television/film Requirements PLOF: better able to don and doff socks, sit and grade papers, sit in his car and drive, perform workouts at the gym.    Observation/Other Assessments   Observations Symptom reproduction with Slump test, no change with cervical extension. SLR test: R (51 degrees R hip flexion) with calf tightness (decreased calf tightness with decreased hip flexion; L hip: symptom reproduction at 46 degrees hip flexion, L hip discomfort with tibial nerve bias. (+) L piriformis test   Modified Oswertry 40%   Posture/Postural Control   Posture Comments Bilaterally pronated feet, R lateral shift, decreased bilateral hip extension, increased lordosis at thoracolumbar junction, bilaterally protracted shoulders and neck, slight R trunk rotation   AROM   Lumbar Flexion limited with reproduction of L LE symptoms    Lumbar Extension WFL but decreased thoracic extension   Lumbar - Right Side [facility] with reproduction of L low back pain   Lumbar - Left Side Bend WFL, no symptoms   Lumbar - Right Rotation WFL, no pain   Lumbar - Left Rotation WFL, no pain   Strength   Right Hip Flexion 4-/5  with L posterior hip pain and proximal hamstring pain   Right Hip Extension 4/5   Right Hip External Rotation  4/5   Right Hip Internal Rotation 4/5   Right Hip ABduction 4/5   Left Hip Flexion 4/5   Left Hip Extension 4-/5  with posterior hip discomfort   Left Hip External Rotation 4/5   Left Hip Internal Rotation 4/5   Left Hip ABduction 4+/5   Right Knee Flexion 4+/5   Right Knee Extension 4+/5   Left Knee Flexion 4+/5   Left Knee Extension 5/5  with L hamstring symptoms   Palpation   Palpation comment Slight TTP proximal sciatic nerve L > R (hanstring area). Slight decreased central P to A mobility to L2, L1, and throughout thoracic vertebrae.     Ambulation/Gait   Gait Comments increased R anterior pelvic rotation                           PT Education - 09/07/15 1102    Education provided Yes   Education Details ther-ex, HEP, plan of care   Person(s) Educated Patient   Methods Explanation;Demonstration;Tactile cues;Verbal cues;Handout   Comprehension Verbalized understanding;Returned demonstration             PT Long Term Goals - 09/07/15 1039    PT LONG TERM GOAL #1  Title Patient will have a decrease in low back pain to 1/10 or less at worst to promote ability to perform sitting tasks such as grading papers.    Baseline 3/10 at worst   Time 4   Period Weeks   Status New   PT LONG TERM GOAL #2   Title Patient will have a decrease in L LE pain to 2/10 or less at worst to promote ability to perform sitting tasks such as grading papers, don and doff socks and shoes.    Baseline 8/10 at worst   Time 4   Period Weeks   Status New   PT LONG TERM GOAL #3   Title Patient will improve his Modified Oswestry Low Back pain Disability Questionnaire by at least 6 points as a demonstration of improved function.    Baseline 40%   Time 4   Period Weeks   Status New   PT LONG TERM GOAL #4   Title Patient will improve bilateral hip strength by at least 1/2 MMT grade to promote ability to exercise, perform squats, and don and doff socks and shoes with less discomfort.    Time 4   Period Weeks   Status New               Plan - 09/07/15 1028    Clinical Impression Statement Patient is a 35 year old male who came to physical therapy secondary to low back pain with L LE symptoms. He also presents with altered posture, glute weakness, positive special tests suggesting low back and piriformis involvement, reproduction of symptoms with standing lumbar flexion,  and standing R lumbar side bending; L LE neural tension, and difficulty tolerating tasks such as squatting, donning and doffing shoes and socks, and  performing sitting tasks such as grading papers. Patient will benefit from skilled physical therapy services to address the aforementioned deficits.    Pt will benefit from skilled therapeutic intervention in order to improve on the following deficits Pain;Decreased strength;Improper body mechanics   Rehab Potential Good   Clinical Impairments Affecting Rehab Potential No known clinical impairments affecting rehab potential   PT Frequency 2x / week   PT Duration 4 weeks   PT Treatment/Interventions Manual techniques;Therapeutic activities;Therapeutic exercise;Patient/family education;Ultrasound;Neuromuscular re-education;Electrical Stimulation;Aquatic Therapy;Dry needling   PT Next Visit Plan posture, gentle extension, hip strengthening, neural flossing (when appropriate)   Consulted and Agree with Plan of Care Patient         Problem List Patient Active Problem List   Diagnosis Date Noted  . Palpitations 04/24/2015  . Anal pain 02/27/2015  . Anal itching 02/27/2015  . Pressure in head 08/05/2014  . ANXIETY 04/30/2007  . ALLERGIC RHINITIS 02/27/2007  . ASTHMA 02/27/2007   Thank you for your referral.  Loralyn Freshwater PT, DPT   09/07/2015, 11:22 AM  [place] [facility] PHYSICAL AND SPORTS MEDICINE [address] [place], [address] Phone: [phone]   Fax:  [phone]  Name: William Stuart MRN: [id number] Date of Birth: August 02, 1981

## 2015-09-07 NOTE — Patient Instructions (Addendum)
  On your stomach, prop yourself up with your elbows (elbows underneath shoulders). Toes pointed in. Take deep breaths for 2 minutes. Repeat 3 times a day.      Also gave sitting on chair with lumbar towel roll (can also be performed in his car) as part of his HEP.    Pt demonstrated and verbalized understanding.

## 2015-09-10 ENCOUNTER — Ambulatory Visit: Payer: BLUE CROSS/BLUE SHIELD | Attending: Medical

## 2015-09-10 DIAGNOSIS — M5432 Sciatica, left side: Secondary | ICD-10-CM

## 2015-09-10 DIAGNOSIS — R531 Weakness: Secondary | ICD-10-CM | POA: Diagnosis present

## 2015-09-10 DIAGNOSIS — M545 Low back pain: Secondary | ICD-10-CM | POA: Insufficient documentation

## 2015-09-10 DIAGNOSIS — G8929 Other chronic pain: Secondary | ICD-10-CM | POA: Diagnosis present

## 2015-09-10 NOTE — Therapy (Signed)
[place] [facility] PHYSICAL [facility] SPORTS MEDICINE [address], [place], [address] Phone: [phone]   Fax:  [phone]  Physical Therapy Treatment  Patient Details  Name: William Stuart MRN: [id number] Date of Birth: 1980-09-11 Referring Provider: Ellie Lunch PA-C  Encounter Date: 09/10/2015      PT End of Session - 09/10/15 0850    Visit Number 2   Number of Visits 9   Date for PT Re-Evaluation 10/08/15   PT Start Time 0849   PT Stop Time 0932   PT Time Calculation (min) 43 min   Activity Tolerance Patient tolerated treatment well   Behavior During Therapy [facility] for tasks assessed/performed      Past Medical History  Diagnosis Date  . Allergy   . Asthma   . Anxiety   . GERD (gastroesophageal reflux disease)     Past Surgical History  Procedure Laterality Date  . Pilonidal cyst excision  2000 & 2001    3 times  . Orchiectomy      L orchiectomy and R orchiopexy for torsion   2002    There were no vitals filed for this visit.  Visit Diagnosis:  Sciatica of left side  Chronic left-sided low back pain, with sciatica presence unspecified  Weakness      Subjective Assessment - 09/10/15 0851    Subjective L LE feels tight. 0/10 L low back currently. 4/10 L low back currently (pt sitting).     Pertinent History Patient states that he has been having back pain for a while (about a year). Back pain increased during February to March 2016. Pt however felt L LE symptoms November 2016 after sitting in his car driving to [place]. Felt back pain, did exercises such as standing forward flexion at the hips which did not help.  Feels a muscle spasm in his L glute, hamstrings and stabbing sensation in his calf muscle. Got a standing desk at work which helps his back. Sitting for 30 min to an hour increases his L LE symptoms (dull pain). Pain used to be in L lower back but feels as if it has migrated to his L LE. Denies tingling or  numbness, or bowel or bladder problems. Had an x-ray for his back 07/2015 which revealed some narrowing in his  lumbar vertebrae.  Pt goes to crossfit. Performed clean snatches, shoulder presses, push press, dead lifts, rowing, sit ups, exercises involving squats (squats increases symptoms).    Patient Stated Goals "get back to the gym, get onto the floor with kids"       Objectives  There-ex  Directed patient with standing R lateral shift correction 10x2 with 5 second holds  Standing back extension with L side bend 10x2 with 5 second holds  Supine bridge 10x3  Prone press-ups 10x 5 second holds for 2 sets  standing L shoulder adduction 10x 5 seconds for 3 sets resisting double green band (decreased R lateral shift posture)  Standing bilateral shoulder extension resisting green thera tube 10x5 second holds                            Then with black theraband 10x5 seconds  Reviewed HEP. Please see pt instructions. Pt demonstrated and verbalized understanding.    Improved exercise technique, movement at target joints, use of target muscles after mod verbal, visual, tactile cues.      Decreased L LE discomfort with sitting, decreased R  lateral shift. Improved left lumbar extension movement (decreased L low back extension compared to R earlier).                        PT Education - 09/10/15 0900    Education provided Yes   Education Details ther-ex, HEP   Person(s) Educated Patient   Methods Explanation;Demonstration;Tactile cues;Verbal cues;Handout   Comprehension Verbalized understanding;Returned demonstration             PT Long Term Goals - 09/07/15 1039    PT LONG TERM GOAL #1   Title Patient will have a decrease in low back pain to 1/10 or less at worst to promote ability to perform sitting tasks such as grading papers.    Baseline 3/10 at worst   Time 4   Period Weeks   Status New   PT LONG TERM GOAL #2   Title Patient will have a decrease  in L LE pain to 2/10 or less at worst to promote ability to perform sitting tasks such as grading papers, don and doff socks and shoes.    Baseline 8/10 at worst   Time 4   Period Weeks   Status New   PT LONG TERM GOAL #3   Title Patient will improve his Modified Oswestry Low Back pain Disability Questionnaire by at least 6 points as a demonstration of improved function.    Baseline 40%   Time 4   Period Weeks   Status New   PT LONG TERM GOAL #4   Title Patient will improve bilateral hip strength by at least 1/2 MMT grade to promote ability to exercise, perform squats, and don and doff socks and shoes with less discomfort.    Time 4   Period Weeks   Status New               Plan - 09/10/15 0900    Clinical Impression Statement Decreased L LE discomfort with sitting, decreased R lateral shift. Improved left lumbar extension movement.   Pt will benefit from skilled therapeutic intervention in order to improve on the following deficits Pain;Decreased strength;Improper body mechanics   Rehab Potential Good   Clinical Impairments Affecting Rehab Potential No known clinical impairments affecting rehab potential   PT Frequency 2x / week   PT Duration 4 weeks   PT Treatment/Interventions Manual techniques;Therapeutic activities;Therapeutic exercise;Patient/family education;Ultrasound;Neuromuscular re-education;Electrical Stimulation;Aquatic Therapy;Dry needling   PT Next Visit Plan posture, gentle extension, hip strengthening, neural flossing (when appropriate)   Consulted and Agree with Plan of Care Patient        Problem List Patient Active Problem List   Diagnosis Date Noted  . Palpitations 04/24/2015  . Anal pain 02/27/2015  . Anal itching 02/27/2015  . Pressure in head 08/05/2014  . ANXIETY 04/30/2007  . ALLERGIC RHINITIS 02/27/2007  . ASTHMA 02/27/2007    Loralyn Freshwater PT, DPT   09/10/2015, 12:51 PM  [place] PHYSICAL AND SPORTS  MEDICINE [address], [place], [address] Phone: [phone]   Fax:  [phone]  Name: William Stuart MRN: [id number] Date of Birth: [birth year]

## 2015-09-10 NOTE — Patient Instructions (Addendum)
  Stand with your right side against the wall, push your pelvis to the right. Hold for 5 seconds, repeat 10 times, for 3 sets daily.   Stand and lean back and to the left (push your pelvis to the front and right). Hold for 5 seconds, repeat 10 times for 3 sets daily.    (Clinic) Adduction    Left side toward band. Pull double green band towards you side.  Hold for 5 seconds  Repeat _10___ times per set. Do __3__ sets per session. Daily      Copyright  VHI. All rights reserved.

## 2015-09-15 ENCOUNTER — Ambulatory Visit: Payer: BLUE CROSS/BLUE SHIELD

## 2015-09-15 DIAGNOSIS — G8929 Other chronic pain: Secondary | ICD-10-CM

## 2015-09-15 DIAGNOSIS — M545 Low back pain: Secondary | ICD-10-CM

## 2015-09-15 DIAGNOSIS — M5432 Sciatica, left side: Secondary | ICD-10-CM | POA: Diagnosis not present

## 2015-09-15 DIAGNOSIS — R531 Weakness: Secondary | ICD-10-CM

## 2015-09-15 NOTE — Therapy (Signed)
[facility] MEDICINE [address] [place], [address] Phone: [phone]   Fax:  [phone]  Physical Therapy Treatment  Patient Details  Name: William Stuart MRN: [id number] Date of Birth: [birth year] Referring Provider: Ellie Lunch PA-C  Encounter Date: 09/15/2015      PT End of Session - 09/15/15 1436    Visit Number 3   Number of Visits 9   Date for PT Re-Evaluation 10/08/15   PT Start Time 1436   PT Stop Time 1523   PT Time Calculation (min) 47 min   Activity Tolerance Patient tolerated treatment well   Behavior During Therapy [facility] for tasks assessed/performed      Past Medical History  Diagnosis Date  . Allergy   . Asthma   . Anxiety   . GERD (gastroesophageal reflux disease)     Past Surgical History  Procedure Laterality Date  . Pilonidal cyst excision  2000 & 2001    3 times  . Orchiectomy      L orchiectomy and R orchiopexy for torsion   2002    There were no vitals filed for this visit.  Visit Diagnosis:  Sciatica of left side  Chronic left-sided low back pain, with sciatica presence unspecified  Weakness      Subjective Assessment - 09/15/15 1439    Subjective 2-3/10 low back, 3-4/10 L LE pain currently. The exercises help   Pertinent History Patient states that he has been having back pain for a while (about a year). Back pain increased during February to March 2016. Pt however felt L LE symptoms November 2016 after sitting in his car driving to [place]. Felt back pain, did exercises such as standing forward flexion at the hips which did not help.  Feels a muscle spasm in his L glute, hamstrings and stabbing sensation in his calf muscle. Got a standing desk at work which helps his back. Sitting for 30 min to an hour increases his L LE symptoms (dull pain). Pain used to be in L lower back but feels as if it has migrated to his L LE. Denies tingling or numbness, or bowel or  bladder problems. Had an x-ray for his back 07/2015 which revealed some narrowing in his  lumbar vertebrae.  Pt goes to crossfit. Performed clean snatches, shoulder presses, push press, dead lifts, rowing, sit ups, exercises involving squats (squats increases symptoms).    Patient Stated Goals "get back to the gym, get onto the floor with kids"   Currently in Pain? Yes   Pain Score 4             Objectives  There-ex  Directed patient with sitting with lumbar towel roll. Pt observed sitting on chair in waiting room slouched. Pt education on sitting posture and back pain with recommendation to     utilize lumbar pillow or towel roll. Pt verbalized understanding.   Standing bilateral scapular retraction (to promote back extension) resisting plate15 for 10x5 seconds              Plate 20 for 25D6 seconds          Plate 25 for 64Q0 seconds  Standing hip machine:                           Hip extension resisting 100 lbs 10x5 seconds for 2 sets each  LE for glute strength  Standing L shoulder adduction (Omega  machine) resisting plate 5 with addional 2 lbs dumbell 10x2 with 5 second holds to correct R lateral shift  L S/L with pillow under pelvis position (to correct R lateral shift). Pt was recommended to try this position for about 5 minutes daily. Pt verbalized understanding.   Forward planks 10 seconds x 3 (slight increase in L posterior hip and proximal thigh discomfort)  Prone press-ups 10x 5 second holds for 2 sets  Supine bridge 10x3  Standing back extension with slight L side bend 10x 5 seconds  Improved exercise technique, movement at target joints, use of target muscles after mod verbal, visual, tactile cues.     No back discomfort after session. Some L posterior hip and proximal thigh discomfort.                         PT Education - 09/15/15 1642    Education provided Yes   Education Details ther-ex   [employer]) Educated Patient   Methods  Explanation;Demonstration;Tactile cues;Verbal cues   Comprehension Verbalized understanding;Returned demonstration             PT Long Term Goals - 09/07/15 1039    PT LONG TERM GOAL #1   Title Patient will have a decrease in low back pain to 1/10 or less at worst to promote ability to perform sitting tasks such as grading papers.    Baseline 3/10 at worst   Time 4   Period Weeks   Status New   PT LONG TERM GOAL #2   Title Patient will have a decrease in L LE pain to 2/10 or less at worst to promote ability to perform sitting tasks such as grading papers, don and doff socks and shoes.    Baseline 8/10 at worst   Time 4   Period Weeks   Status New   PT LONG TERM GOAL #3   Title Patient will improve his Modified Oswestry Low Back pain Disability Questionnaire by at least 6 points as a demonstration of improved function.    Baseline 40%   Time 4   Period Weeks   Status New   PT LONG TERM GOAL #4   Title Patient will improve bilateral hip strength by at least 1/2 MMT grade to promote ability to exercise, perform squats, and don and doff socks and shoes with less discomfort.    Time 4   Period Weeks   Status New               Plan - 09/15/15 1523    Clinical Impression Statement No back discomfort after session. Some L posterior hip and proximal thigh discomfort.    Pt will benefit from skilled therapeutic intervention in order to improve on the following deficits Pain;Decreased strength;Improper body mechanics   Rehab Potential Good   Clinical Impairments Affecting Rehab Potential No known clinical impairments affecting rehab potential   PT Frequency 2x / week   PT Duration 4 weeks   PT Treatment/Interventions Manual techniques;Therapeutic activities;Therapeutic exercise;Patient/family education;Ultrasound;Neuromuscular re-education;Electrical Stimulation;Aquatic Therapy;Dry needling   PT Next Visit Plan posture, gentle extension, hip strengthening, neural flossing  (when appropriate)   Consulted and Agree with Plan of Care Patient        Problem List Patient Active Problem List   Diagnosis Date Noted  . Palpitations 04/24/2015  . Anal pain 02/27/2015  . Anal itching 02/27/2015  . Pressure in head 08/05/2014  . ANXIETY 04/30/2007  . ALLERGIC RHINITIS 02/27/2007  .  ASTHMA 02/27/2007    Loralyn Freshwater PT, DPT   09/15/2015, 4:46 PM  [facility] PHYSICAL AND SPORTS MEDICINE [address], [place], [address] Phone: [phone]   Fax:  [phone]  Name: DAEMIAN GAHM MRN: [id number] Date of Birth: March 21, 1981

## 2015-09-16 ENCOUNTER — Ambulatory Visit: Payer: BLUE CROSS/BLUE SHIELD

## 2015-09-16 DIAGNOSIS — G8929 Other chronic pain: Secondary | ICD-10-CM

## 2015-09-16 DIAGNOSIS — M5432 Sciatica, left side: Secondary | ICD-10-CM

## 2015-09-16 DIAGNOSIS — M545 Low back pain: Secondary | ICD-10-CM

## 2015-09-16 DIAGNOSIS — R531 Weakness: Secondary | ICD-10-CM

## 2015-09-16 NOTE — Therapy (Signed)
[facility]TS [facility]ICINE [address] [place], [address] Phone: [phone]   Fax:  [phone]  Physical Therapy Treatment  Patient Details  Name: William Stuart MRN: [id number] Date of Birth: 30-Apr-1981 Referring Provider: Ellie Lunch PA-C  Encounter Date: 09/16/2015      PT End of Session - 09/16/15 0909    Visit Number 4   Number of Visits 9   Date for PT Re-Evaluation 10/08/15   PT Start Time 0909   PT Stop Time 0958   PT Time Calculation (min) 49 min   Activity Tolerance Patient tolerated treatment well   Behavior During Therapy [facility] for tasks assessed/performed      Past Medical History  Diagnosis Date  . Allergy   . Asthma   . Anxiety   . GERD (gastroesophageal reflux disease)     Past Surgical History  Procedure Laterality Date  . Pilonidal cyst excision  2000 & 2001    3 times  . Orchiectomy      L orchiectomy and R orchiopexy for torsion   2002    There were no vitals filed for this visit.  Visit Diagnosis:  Sciatica of left side  Chronic left-sided low back pain, with sciatica presence unspecified  Weakness      Subjective Assessment - 09/16/15 0908    Subjective Back and leg started feeling pretty tight. Woke up good this morning. Feels a little better.    Pertinent History Patient states that he has been having back pain for a while (about a year). Back pain increased during February to March 2016. Pt however felt L LE symptoms November 2016 after sitting in his car driving to [place]. Felt back pain, did exercises such as standing forward flexion at the hips which did not help.  Feels a muscle spasm in his L glute, hamstrings and stabbing sensation in his calf muscle. Got a standing desk at work which helps his back. Sitting for 30 min to an hour increases his L LE symptoms (dull pain). Pain used to be in L lower back but feels as if it has migrated to his L LE. Denies tingling  or numbness, or bowel or bladder problems. Had an x-ray for his back 07/2015 which revealed some narrowing in his  lumbar vertebrae.  Pt goes to crossfit. Performed clean snatches, shoulder presses, push press, dead lifts, rowing, sit ups, exercises involving squats (squats increases symptoms).    Patient Stated Goals "get back to the gym, get onto the floor with kids"   Currently in Pain? No/denies   Pain Score 0-No pain           Objectives  Manual therapy:  Prone UPA to L and R L5, L4 transverse processes grade 3 (decreased stiffness, improved mobility   There-ex  Pt observed sitting slouched at waiting room. Pt was recommended to sit in upright posture when he remembers. Pt verbalized understanding.  Directed patient with prone glute max/quad set 10x10 seconds each LE  Prone press-ups 10x 5 second holds for 2 sets  Sitting with proper posture: manual perturbations by PT 1 minute x 3  Backwards wedding march 32 ft x 4 resisting blue band  Side stepping resisting blue band 32 ft x 2 each direction  Standing bilateral ankle DF/PF on rocker board 2 min  Standing bilateral shoulder extension with scapular retraction 10x5 seconds for 2 sets resisting blue band   Improved exercise technique, movement at target joints,  use of target muscles after mod verbal, visual, tactile cues.     Decreasing R lateral shift compared to previous sessions. Tolerated session well without complain of low back or L LE pain or symptoms.               PT Education - 09/16/15 1001    Education provided Yes   Education Details ther-ex   [employer]) Educated Patient   Methods Explanation;Demonstration;Tactile cues;Verbal cues   Comprehension Verbalized understanding;Returned demonstration             PT Long Term Goals - 09/07/15 1039    PT LONG TERM GOAL #1   Title Patient will have a decrease in low back pain to 1/10 or less at worst to promote ability to perform sitting tasks  such as grading papers.    Baseline 3/10 at worst   Time 4   Period Weeks   Status New   PT LONG TERM GOAL #2   Title Patient will have a decrease in L LE pain to 2/10 or less at worst to promote ability to perform sitting tasks such as grading papers, don and doff socks and shoes.    Baseline 8/10 at worst   Time 4   Period Weeks   Status New   PT LONG TERM GOAL #3   Title Patient will improve his Modified Oswestry Low Back pain Disability Questionnaire by at least 6 points as a demonstration of improved function.    Baseline 40%   Time 4   Period Weeks   Status New   PT LONG TERM GOAL #4   Title Patient will improve bilateral hip strength by at least 1/2 MMT grade to promote ability to exercise, perform squats, and don and doff socks and shoes with less discomfort.    Time 4   Period Weeks   Status New               Plan - 09/16/15 0908    Clinical Impression Statement Decreasing R lateral shift compared to previous sessions. Tolerated session well without complain of low back or L LE pain or symptoms.    Pt will benefit from skilled therapeutic intervention in order to improve on the following deficits Pain;Decreased strength;Improper body mechanics   Rehab Potential Good   Clinical Impairments Affecting Rehab Potential No known clinical impairments affecting rehab potential   PT Frequency 2x / week   PT Duration 4 weeks   PT Treatment/Interventions Manual techniques;Therapeutic activities;Therapeutic exercise;Patient/family education;Ultrasound;Neuromuscular re-education;Electrical Stimulation;Aquatic Therapy;Dry needling   PT Next Visit Plan posture, gentle extension, hip strengthening, neural flossing (when appropriate)   Consulted and Agree with Plan of Care Patient        Problem List Patient Active Problem List   Diagnosis Date Noted  . Palpitations 04/24/2015  . Anal pain 02/27/2015  . Anal itching 02/27/2015  . Pressure in head 08/05/2014  . ANXIETY  04/30/2007  . ALLERGIC RHINITIS 02/27/2007  . ASTHMA 02/27/2007    Loralyn Freshwater PT, DPT   09/16/2015, 10:05 AM  [place] [facility] PHYSICAL AND SPORTS MEDICINE [address] [place], [address] Phone: [phone]   Fax:  [phone]  Name: William Stuart MRN: [id number] Date of Birth: 10/14/1980

## 2015-09-21 ENCOUNTER — Ambulatory Visit: Payer: BLUE CROSS/BLUE SHIELD

## 2015-09-21 DIAGNOSIS — M545 Low back pain: Secondary | ICD-10-CM

## 2015-09-21 DIAGNOSIS — M5432 Sciatica, left side: Secondary | ICD-10-CM

## 2015-09-21 DIAGNOSIS — G8929 Other chronic pain: Secondary | ICD-10-CM

## 2015-09-21 DIAGNOSIS — R531 Weakness: Secondary | ICD-10-CM

## 2015-09-21 NOTE — Therapy (Signed)
[place] [facility]TS MEDICINE [address], [place], [address] Phone: [phone]   Fax:  [phone]  Physical Therapy Treatment  Patient Details  Name: William Stuart MRN: [id number] Date of Birth: 11-29-1980 Referring Provider: Ellie Lunch PA-C  Encounter Date: 09/21/2015      PT End of Session - 09/21/15 1046    Visit Number 5   Number of Visits 9   Date for PT Re-Evaluation 10/08/15   PT Start Time 1046   PT Stop Time 1134   PT Time Calculation (min) 48 min   Activity Tolerance Patient tolerated treatment well   Behavior During Therapy [facility] for tasks assessed/performed      Past Medical History  Diagnosis Date  . Allergy   . Asthma   . Anxiety   . GERD (gastroesophageal reflux disease)     Past Surgical History  Procedure Laterality Date  . Pilonidal cyst excision  2000 & 2001    3 times  . Orchiectomy      L orchiectomy and R orchiopexy for torsion   2002    There were no vitals filed for this visit.  Visit Diagnosis:  Sciatica of left side  Chronic left-sided low back pain, with sciatica presence unspecified  Weakness      Subjective Assessment - 09/21/15 1044    Subjective Off and on this morning. 2-3/10 currently. 3-4/10 L LE currenlty. The constant getting into and out of the car bothers him    Pertinent History Patient states that he has been having back pain for a while (about a year). Back pain increased during February to March 2016. Pt however felt L LE symptoms November 2016 after sitting in his car driving to [place]. Felt back pain, did exercises such as standing forward flexion at the hips which did not help.  Feels a muscle spasm in his L glute, hamstrings and stabbing sensation in his calf muscle. Got a standing desk at work which helps his back. Sitting for 30 min to an hour increases his L LE symptoms (dull pain). Pain used to be in L lower back but feels as if it has  migrated to his L LE. Denies tingling or numbness, or bowel or bladder problems. Had an x-ray for his back 07/2015 which revealed some narrowing in his  lumbar vertebrae.  Pt goes to crossfit. Performed clean snatches, shoulder presses, push press, dead lifts, rowing, sit ups, exercises involving squats (squats increases symptoms).    Patient Stated Goals "get back to the gym, get onto the floor with kids"   Currently in Pain? Yes   Pain Score 4    Multiple Pain Sites Yes         Manual therapy:  Prone UPA to L and R L5, L4 transverse processes grade 3   There-ex    Directed patient with prone press-ups 10x 10 second holds for 2 sets     Reviewed and given as part of his HEP. Pt demonstrated and verbalized understanding.  Supine bridge 10x5 second holds for 3 sets  Log rolling x 1  Sitting with proper posture: manual perturbations by PT 1 minute x 3  Ergonomic sit <> stand from driver's seat of car (to decrease lumbar flexion when performing activity) Ergonomic getting child out of back seat of car to prevent lumbar flexion  Standing hip machine:  Hip extension resisting 100 lbs 10x5 seconds for 2 sets each LE for glute strength  Improved exercise technique, movement at target joints, use of target muscles after min to mod verbal, visual, tactile cues.       Decreased back and L LE pain during exercises. Difficulty preventing lumbar flexion when simulating trying to get his 35 year old son from back seat secondary to pt height. Pt was recommended that when he gets his trunk in the back part of the car, prop his leg onto the seat, and straighten his lumbar spine so at least his lumbar spine is not flexed the entire time. Pt demonstrated and verbalized understanding.                    PT Education - 09/21/15 1104    Education provided Yes   Education Details ther-ex, HEP   Person(s) Educated Patient   Methods  Explanation;Demonstration;Tactile cues;Verbal cues;Handout   Comprehension Verbalized understanding;Returned demonstration             PT Long Term Goals - 09/07/15 1039    PT LONG TERM GOAL #1   Title Patient will have a decrease in low back pain to 1/10 or less at worst to promote ability to perform sitting tasks such as grading papers.    Baseline 3/10 at worst   Time 4   Period Weeks   Status New   PT LONG TERM GOAL #2   Title Patient will have a decrease in L LE pain to 2/10 or less at worst to promote ability to perform sitting tasks such as grading papers, don and doff socks and shoes.    Baseline 8/10 at worst   Time 4   Period Weeks   Status New   PT LONG TERM GOAL #3   Title Patient will improve his Modified Oswestry Low Back pain Disability Questionnaire by at least 6 points as a demonstration of improved function.    Baseline 40%   Time 4   Period Weeks   Status New   PT LONG TERM GOAL #4   Title Patient will improve bilateral hip strength by at least 1/2 MMT grade to promote ability to exercise, perform squats, and don and doff socks and shoes with less discomfort.    Time 4   Period Weeks   Status New               Plan - 09/21/15 1045    Clinical Impression Statement Decreased back and L LE pain during exercises. Difficulty preventing lumbar flexion when simulating trying to get his 35 year old son from back seat secondary to pt height. Pt was recommended that when he gets his trunk in the back part of the car, prop his leg onto the seat, and straighten his lumbar spine so at least his lumbar spine is not flexed the entire time. Pt demonstrated and verbalized understanding.    Pt will benefit from skilled therapeutic intervention in order to improve on the following deficits Pain;Decreased strength;Improper body mechanics   Rehab Potential Good   Clinical Impairments Affecting Rehab Potential No known clinical impairments affecting rehab potential   PT  Frequency 2x / week   PT Duration 4 weeks   PT Treatment/Interventions Manual techniques;Therapeutic activities;Therapeutic exercise;Patient/family education;Ultrasound;Neuromuscular re-education;Electrical Stimulation;Aquatic Therapy;Dry needling   PT Next Visit Plan posture, gentle extension, hip strengthening, neural flossing (when appropriate)   Consulted and Agree with Plan of Care Patient        Problem List Patient Active Problem List   Diagnosis Date Noted  . Palpitations  04/24/2015  . Anal pain 02/27/2015  . Anal itching 02/27/2015  . Pressure in head 08/05/2014  . ANXIETY 04/30/2007  . ALLERGIC RHINITIS 02/27/2007  . ASTHMA 02/27/2007    Loralyn Freshwater PT, DPT   09/21/2015, 11:43 AM  [facility] [facility] PHYSICAL AND SPORTS MEDICINE [address] [place], [address] Phone: [phone]   Fax:  [phone]  Name: William Stuart MRN: [id number] Date of Birth: [birth year]

## 2015-09-21 NOTE — Patient Instructions (Signed)
Trunk: Prone Extension (Press-Ups)    Lie on stomach on firm, flat surface. Relax bottom and legs. Raise chest in air with elbows straight. Keep hips flat on surface, sag stomach. Hold __10__ seconds. Repeat _10___ times. Do ___3_ sessions per day. CAUTION: Movement should be gentle and slow.  Copyright  [employer]. All rights reserved.

## 2015-09-23 ENCOUNTER — Ambulatory Visit: Payer: BLUE CROSS/BLUE SHIELD

## 2015-09-23 DIAGNOSIS — R531 Weakness: Secondary | ICD-10-CM

## 2015-09-23 DIAGNOSIS — M5432 Sciatica, left side: Secondary | ICD-10-CM

## 2015-09-23 DIAGNOSIS — G8929 Other chronic pain: Secondary | ICD-10-CM

## 2015-09-23 DIAGNOSIS — M545 Low back pain: Secondary | ICD-10-CM

## 2015-09-23 NOTE — Patient Instructions (Signed)
  Gave supine transversus abdominis with pelvic floor contractions as well as supine hip fall outs 10x3 with 5 second holds 3x daily 5 days a week as part of his HEP. Pt demonstrated and verbalized understanding.

## 2015-09-23 NOTE — Therapy (Signed)
[place] [facility] MEDICINE [address], [place], [address] Phone: [phone]   Fax:  [phone]  Physical Therapy Treatment  Patient Details  Name: William Stuart MRN: [id number] Date of Birth: 22-Feb-1981 Referring Provider: Ellie Lunch PA-C  Encounter Date: 09/23/2015      PT End of Session - 09/23/15 1002    Visit Number 6   Number of Visits 9   Date for PT Re-Evaluation 10/08/15   PT Start Time 1002   PT Stop Time 1043   PT Time Calculation (min) 41 min   Activity Tolerance Patient tolerated treatment well   Behavior During Therapy [facility] for tasks assessed/performed      Past Medical History  Diagnosis Date  . Allergy   . Asthma   . Anxiety   . GERD (gastroesophageal reflux disease)     Past Surgical History  Procedure Laterality Date  . Pilonidal cyst excision  2000 & 2001    3 times  . Orchiectomy      L orchiectomy and R orchiopexy for torsion   2002    There were no vitals filed for this visit.  Visit Diagnosis:  Sciatica of left side  Chronic left-sided low back pain, with sciatica presence unspecified  Weakness      Subjective Assessment - 09/23/15 1003    Subjective Back is fine, glute is tight.  Getting out of the car with back straight is better for his back.   Pertinent History Patient states that he has been having back pain for a while (about a year). Back pain increased during February to March 2016. Pt however felt L LE symptoms November 2016 after sitting in his car driving to [place]. Felt back pain, did exercises such as standing forward flexion at the hips which did not help.  Feels a muscle spasm in his L glute, hamstrings and stabbing sensation in his calf muscle. Got a standing desk at work which helps his back. Sitting for 30 min to an hour increases his L LE symptoms (dull pain). Pain used to be in L lower back but feels as if it has migrated to his L LE. Denies  tingling or numbness, or bowel or bladder problems. Had an x-ray for his back 07/2015 which revealed some narrowing in his  lumbar vertebrae.  Pt goes to crossfit. Performed clean snatches, shoulder presses, push press, dead lifts, rowing, sit ups, exercises involving squats (squats increases symptoms).    Patient Stated Goals "get back to the gym, get onto the floor with kids"   Currently in Pain? No/denies   Pain Score 0-No pain        There-ex    Directed patient with sitting with proper posture: manual perturbations by PT 1 minute x 3  Seated with proper posture: bilateral shoulder extension with scapular retraction resisting green band 10x3 with 5 second holds  Sitting on physioball: gentle anterior pelvic tilt (to promote lumbar extension) 20x2              Gentle side/side weight shifting to promote lumbar mobility 20x2 each direction            With 2 kg ball toss to trampoline 20x2 with upright posture           Small leg extensions 10x each (difficulty with lumbopelvic control)  Supine transversus abdominis contraction 10x5 seconds,     Then with pelvic floor contraction 10x5 seconds    Then with  hip fallouts 5x each LE (reviewed and given as part of his HEP; pt demonstrated and verbalized understanding)   Improved exercise technique, movement at target joints, use of target muscles after min to mod verbal, visual, tactile cues.     Pt tolerated session well without increased symptoms. Some difficulty with lumbopelvic control with seated leg extensions sitting  on physioball.                                PT Education - 09/23/15 1012    Education provided Yes   Education Details ther-ex, HEP   Person(s) Educated Patient   Methods Explanation;Demonstration;Tactile cues;Verbal cues;Handout   Comprehension Verbalized understanding;Returned demonstration             PT Long Term Goals - 09/07/15 1039    PT LONG TERM GOAL #1   Title  Patient will have a decrease in low back pain to 1/10 or less at worst to promote ability to perform sitting tasks such as grading papers.    Baseline 3/10 at worst   Time 4   Period Weeks   Status New   PT LONG TERM GOAL #2   Title Patient will have a decrease in L LE pain to 2/10 or less at worst to promote ability to perform sitting tasks such as grading papers, don and doff socks and shoes.    Baseline 8/10 at worst   Time 4   Period Weeks   Status New   PT LONG TERM GOAL #3   Title Patient will improve his Modified Oswestry Low Back pain Disability Questionnaire by at least 6 points as a demonstration of improved function.    Baseline 40%   Time 4   Period Weeks   Status New   PT LONG TERM GOAL #4   Title Patient will improve bilateral hip strength by at least 1/2 MMT grade to promote ability to exercise, perform squats, and don and doff socks and shoes with less discomfort.    Time 4   Period Weeks   Status New               Plan - 09/23/15 1012    Clinical Impression Statement Pt tolerated session well without increased symptoms. Some difficulty with lumbopelvic control with seated leg extensions sitting  on physioball.   Pt will benefit from skilled therapeutic intervention in order to improve on the following deficits Pain;Decreased strength;Improper body mechanics   Rehab Potential Good   Clinical Impairments Affecting Rehab Potential No known clinical impairments affecting rehab potential   PT Frequency 2x / week   PT Duration 4 weeks   PT Treatment/Interventions Manual techniques;Therapeutic activities;Therapeutic exercise;Patient/family education;Ultrasound;Neuromuscular re-education;Electrical Stimulation;Aquatic Therapy;Dry needling   PT Next Visit Plan posture, gentle extension, hip strengthening, neural flossing (when appropriate)   Consulted and Agree with Plan of Care Patient        Problem List Patient Active Problem List   Diagnosis Date Noted  .  Palpitations 04/24/2015  . Anal pain 02/27/2015  . Anal itching 02/27/2015  . Pressure in head 08/05/2014  . ANXIETY 04/30/2007  . ALLERGIC RHINITIS 02/27/2007  . ASTHMA 02/27/2007    Loralyn Freshwater PT, DPT   09/23/2015, 8:09 PM  [place] [facility] PHYSICAL AND SPORTS MEDICINE [address] [place], [address] Phone: [phone]   Fax:  [phone]  Name: William Stuart MRN: [id number] Date of Birth:  12/10/1980     

## 2015-09-28 ENCOUNTER — Ambulatory Visit: Payer: BLUE CROSS/BLUE SHIELD

## 2015-09-28 DIAGNOSIS — M5432 Sciatica, left side: Secondary | ICD-10-CM | POA: Diagnosis not present

## 2015-09-28 DIAGNOSIS — R531 Weakness: Secondary | ICD-10-CM

## 2015-09-28 DIAGNOSIS — G8929 Other chronic pain: Secondary | ICD-10-CM

## 2015-09-28 DIAGNOSIS — M545 Low back pain: Secondary | ICD-10-CM

## 2015-09-28 NOTE — Therapy (Signed)
[place] [facility] [address], [place], [address] Phone: [phone]   Fax:  [phone]  Physical Therapy Evaluation  Patient Details  Name: William Stuart MRN: [id number] Date of Birth: December 02, 1980 Referring Provider: Ellie Lunch PA-C  Encounter Date: 09/28/2015      PT End of Session - 09/28/15 0912    Visit Number 7   Number of Visits 9   Date for PT Re-Evaluation 10/08/15   PT Start Time 0912   PT Stop Time 1003   PT Time Calculation (min) 51 min   Activity Tolerance Patient tolerated treatment well   Behavior During Therapy [facility] for tasks assessed/performed      Past Medical History  Diagnosis Date  . Allergy   . Asthma   . Anxiety   . GERD (gastroesophageal reflux disease)     Past Surgical History  Procedure Laterality Date  . Pilonidal cyst excision  2000 & 2001    3 times  . Orchiectomy      L orchiectomy and R orchiopexy for torsion   2002    There were no vitals filed for this visit.  Visit Diagnosis:  Sciatica of left side  Chronic left-sided low back pain, with sciatica presence unspecified  Weakness      Subjective Assessment - 09/28/15 0912    Subjective Back and L side is feeling good today. Pleased with what he was able to accomplish. Did some kettle bell snatches (53 lbs) , and hang power clean with the barbell (115 lbs). Back usually bothers him during the first hour of waking up. Pt states mostly sleeping on his right side. Pt states that he does not feel his L LE pain as much at night.    Pertinent History Patient states that he has been having back pain for a while (about a year). Back pain increased during February to March 2016. Pt however felt L LE symptoms November 2016 after sitting in his car driving to [place]. Felt back pain, did exercises such as standing forward flexion at the hips which did not help.  Feels a muscle spasm in his L glute, hamstrings and  stabbing sensation in his calf muscle. Got a standing desk at work which helps his back. Sitting for 30 min to an hour increases his L LE symptoms (dull pain). Pain used to be in L lower back but feels as if it has migrated to his L LE. Denies tingling or numbness, or bowel or bladder problems. Had an x-ray for his back 07/2015 which revealed some narrowing in his  lumbar vertebrae.  Pt goes to crossfit. Performed clean snatches, shoulder presses, push press, dead lifts, rowing, sit ups, exercises involving squats (squats increases symptoms).    Patient Stated Goals "get back to the gym, get onto the floor with kids"   Currently in Pain? No/denies   Pain Score 0-No pain   Multiple Pain Sites No    4/10 low back and L LE pain at most for the past 3 days (during the first hour of waking up), used to be 6-7/10. 2-3/10 at most the rest of the day. Other times, pt does not feel back or L LE symptoms.    Objectives:  R hip IR 25 degree, R hip ER 48 degrees  L hip IR 25 degrees, L hip ER 38 degrees   There-ex  Pt education on holding off on kettle bell snatches secondary to lifting and twisting. Pt  verbalized understanding.   Directed patient with standing L shoulder adduction resisting plate 5 for 04V4 seconds for 3 sets   sitting with proper posture: manual perturbations by PT 1 minute x 3  Sitting on physioball: gentle anterior pelvic tilt (to promote lumbar extension) 20x2   Gentle side/side weight shifting to promote lumbar mobility 20x2 each direction  With 2 kg ball toss to trampoline 20x2 with upright posture  Seated with proper posture: bilateral shoulder extension with scapular retraction resisting green band 10x3 with 5 second holds  Supine transversus abdominis contraction with pelvic floor contraction with hip fallouts 10x each LE    Improved exercise technique, movement at target  joints, use of target muscles after min to mod verbal, visual, tactile cues.    Manual therapy:   Posterior glide L hip joint grade 3+ to 4    Tolerated session well without complain of pain. L hip IR ROM improved by 2 degrees after manual therapy.           PT Education - 09/28/15 [phone]    Education provided Yes   Education Details ther-ex   Person(s) Educated Patient   Methods Explanation;Demonstration;Tactile cues;Verbal cues   Comprehension Verbalized understanding;Returned demonstration             PT Long Term Goals - 09/07/15 1039    PT LONG TERM GOAL #1   Title Patient will have a decrease in low back pain to 1/10 or less at worst to promote ability to perform sitting tasks such as grading papers.    Baseline 3/10 at worst   Time 4   Period Weeks   Status New   PT LONG TERM GOAL #2   Title Patient will have a decrease in L LE pain to 2/10 or less at worst to promote ability to perform sitting tasks such as grading papers, don and doff socks and shoes.    Baseline 8/10 at worst   Time 4   Period Weeks   Status New   PT LONG TERM GOAL #3   Title Patient will improve his Modified Oswestry Low Back pain Disability Questionnaire by at least 6 points as a demonstration of improved function.    Baseline 40%   Time 4   Period Weeks   Status New   PT LONG TERM GOAL #4   Title Patient will improve bilateral hip strength by at least 1/2 MMT grade to promote ability to exercise, perform squats, and don and doff socks and shoes with less discomfort.    Time 4   Period Weeks   Status New               Plan - 09/28/15 0911    Clinical Impression Statement Tolerated session well without complain of pain. L hip IR ROM improved by 2 degrees after manual therapy.    Pt will benefit from skilled therapeutic intervention in order to improve on the following deficits Pain;Decreased strength;Improper body mechanics   Rehab Potential Good   Clinical Impairments  Affecting Rehab Potential No known clinical impairments affecting rehab potential   PT Frequency 2x / week   PT Duration 4 weeks   PT Treatment/Interventions Manual techniques;Therapeutic activities;Therapeutic exercise;Patient/family education;Ultrasound;Neuromuscular re-education;Electrical Stimulation;Aquatic Therapy;Dry needling   PT Next Visit Plan posture, gentle extension, hip strengthening, neural flossing (when appropriate)   Consulted and Agree with Plan of Care Patient         Problem List Patient Active Problem List   Diagnosis Date Noted  .  Palpitations 04/24/2015  . Anal pain 02/27/2015  . Anal itching 02/27/2015  . Pressure in head 08/05/2014  . ANXIETY 04/30/2007  . ALLERGIC RHINITIS 02/27/2007  . ASTHMA 02/27/2007    Loralyn Freshwater PT, DPT   09/28/2015, 3:11 PM  [place] [facility] PHYSICAL AND SPORTS MEDICINE [address] [place], [address] Phone: [phone]   Fax:  [phone]  Name: CORRELL DENBOW MRN: [id number] Date of Birth: 06/05/1981

## 2015-09-30 ENCOUNTER — Ambulatory Visit: Payer: BLUE CROSS/BLUE SHIELD

## 2015-09-30 DIAGNOSIS — G8929 Other chronic pain: Secondary | ICD-10-CM

## 2015-09-30 DIAGNOSIS — M545 Low back pain: Secondary | ICD-10-CM

## 2015-09-30 DIAGNOSIS — R531 Weakness: Secondary | ICD-10-CM

## 2015-09-30 DIAGNOSIS — M5432 Sciatica, left side: Secondary | ICD-10-CM

## 2015-09-30 NOTE — Therapy (Signed)
[place] [facility]ORTS MEDICINE [address] [place], [address] Phone: [phone]   Fax:  [phone]  Physical Therapy Treatment  Patient Details  Name: William Stuart MRN: [id number] Date of Birth: [birth year] Referring Provider: Ellie Lunch PA-C  Encounter Date: 09/30/2015      PT End of Session - 09/30/15 0914    Visit Number 8   Number of Visits 9   Date for PT Re-Evaluation 10/08/15   PT Start Time 0914   PT Stop Time 1001   PT Time Calculation (min) 47 min   Activity Tolerance Patient tolerated treatment well   Behavior During Therapy [facility] for tasks assessed/performed      Past Medical History  Diagnosis Date  . Allergy   . Asthma   . Anxiety   . GERD (gastroesophageal reflux disease)     Past Surgical History  Procedure Laterality Date  . Pilonidal cyst excision  2000 & 2001    3 times  . Orchiectomy      L orchiectomy and R orchiopexy for torsion   2002    There were no vitals filed for this visit.  Visit Diagnosis:  Sciatica of left side  Chronic left-sided low back pain, with sciatica presence unspecified  Weakness      Subjective Assessment - 09/30/15 0916    Subjective L posterior hip feels tight this morning. Yesterday morning after waking up, low back and L posterior hip is pretty good. Also better able to put his socks on this morning.    Pertinent History Patient states that he has been having back pain for a while (about a year). Back pain increased during February to March 2016. Pt however felt L LE symptoms November 2016 after sitting in his car driving to [place]. Felt back pain, did exercises such as standing forward flexion at the hips which did not help.  Feels a muscle spasm in his L glute, hamstrings and stabbing sensation in his calf muscle. Got a standing desk at work which helps his back. Sitting for 30 min to an hour increases his L LE symptoms (dull pain). Pain used to  be in L lower back but feels as if it has migrated to his L LE. Denies tingling or numbness, or bowel or bladder problems. Had an x-ray for his back 07/2015 which revealed some narrowing in his  lumbar vertebrae.  Pt goes to crossfit. Performed clean snatches, shoulder presses, push press, dead lifts, rowing, sit ups, exercises involving squats (squats increases symptoms).    Patient Stated Goals "get back to the gym, get onto the floor with kids"   Currently in Pain? No/denies  L posterior hip tightness   Multiple Pain Sites No         Objectives:  Manual therapy:   Posterior glide L hip joint grade 3+ to 4 with lateral distraction  Lateral L hip glide grade 3+ in supine  21 degrees L hip IR    There-ex  Supine transversus abdominis contraction with pelvic floor contraction with hip fallouts 10x each LE  Supine transversus abdominis contraction with pelvic floor contraction with alternate knee extension in hooklying position 10x each LE  sitting with proper posture: manual perturbations by PT 1 minute x 3  Sitting on physioball: gentle anterior pelvic tilt (to promote lumbar extension) 20x2   Gentle side/side weight shifting to promote lumbar mobility 20x2 each direction  Body blade palms forward 10 seconds x 10  With 3 kg ball toss to trampoline 20x2 with upright posture   Improved exercise technique, movement at target joints, use of target muscles after min to mod verbal, visual, tactile cues.                       PT Education - 09/30/15 0941    Education provided Yes   Education Details ther-ex   [employer]) Educated Patient   Methods Explanation;Demonstration;Tactile cues;Verbal cues   Comprehension Verbalized understanding;Returned demonstration             PT Long Term Goals - 09/30/15 1107    PT LONG TERM GOAL #1   Title Patient will have a decrease in low back pain  to 1/10 or less at worst to promote ability to perform sitting tasks such as grading papers.    Baseline 4/10 LBP at worst during the first hour of waking up, 2-3/10 at most for the rest of the day. Other times, pt does not feel LBP or L LE symptoms. Used to be 6-7/10 at most per pt.    Time 4   Period Weeks   Status On-going   PT LONG TERM GOAL #2   Title Patient will have a decrease in L LE pain to 2/10 or less at worst to promote ability to perform sitting tasks such as grading papers, don and doff socks and shoes.    Baseline 4/10 LBP at worst during the first hour of waking up, 2-3/10 at most for the rest of the day. Other times, pt does not feel LBP or L LE symptoms. Used to be 6-7/10 at most per pt.    Time 4   Period Weeks   Status On-going   PT LONG TERM GOAL #3   Title Patient will improve his Modified Oswestry Low Back pain Disability Questionnaire by at least 6 points as a demonstration of improved function.    Baseline 40%   Time 4   Period Weeks   Status Unable to assess   PT LONG TERM GOAL #4   Title Patient will improve bilateral hip strength by at least 1/2 MMT grade to promote ability to exercise, perform squats, and don and doff socks and shoes with less discomfort.    Time 4   Period Weeks   Status Unable to assess               Plan - 09/30/15 0914    Clinical Impression Statement Pt making progress towards decreased back pain in the morning as well as improving sitting tolerance and ability to don and doff pants and socks. Pt also demonstrates independence with his HEP.    Pt will benefit from skilled therapeutic intervention in order to improve on the following deficits Pain;Decreased strength;Improper body mechanics   Rehab Potential Good   Clinical Impairments Affecting Rehab Potential No known clinical impairments affecting rehab potential   PT Frequency 2x / week   PT Duration 4 weeks   PT Treatment/Interventions Manual techniques;Therapeutic  activities;Therapeutic exercise;Patient/family education;Ultrasound;Neuromuscular re-education;Electrical Stimulation;Aquatic Therapy;Dry needling   PT Next Visit Plan posture, gentle extension, hip strengthening, neural flossing (when appropriate)   Consulted and Agree with Plan of Care Patient        Problem List Patient Active Problem List   Diagnosis Date Noted  . Palpitations 04/24/2015  . Anal pain 02/27/2015  . Anal itching 02/27/2015  . Pressure in head 08/05/2014  .  ANXIETY 04/30/2007  . ALLERGIC RHINITIS 02/27/2007  . ASTHMA 02/27/2007    Loralyn Freshwater PT, DPT   09/30/2015, 7:16 PM  [place] [facility] PHYSICAL AND SPORTS MEDICINE [address], [place], [address] Phone: [phone]   Fax:  [phone]  Name: William Stuart MRN: [id number] Date of Birth: 04-Sep-1980

## 2016-02-27 DIAGNOSIS — J0140 Acute pansinusitis, unspecified: Secondary | ICD-10-CM | POA: Diagnosis not present

## 2016-03-07 DIAGNOSIS — R000 Tachycardia, unspecified: Secondary | ICD-10-CM | POA: Diagnosis not present

## 2016-03-07 DIAGNOSIS — K219 Gastro-esophageal reflux disease without esophagitis: Secondary | ICD-10-CM | POA: Diagnosis not present

## 2016-03-07 DIAGNOSIS — R002 Palpitations: Secondary | ICD-10-CM | POA: Diagnosis not present

## 2016-03-07 DIAGNOSIS — R001 Bradycardia, unspecified: Secondary | ICD-10-CM | POA: Diagnosis not present

## 2016-03-15 ENCOUNTER — Telehealth: Payer: Self-pay | Admitting: Internal Medicine

## 2016-03-15 ENCOUNTER — Ambulatory Visit (INDEPENDENT_AMBULATORY_CARE_PROVIDER_SITE_OTHER): Payer: BLUE CROSS/BLUE SHIELD | Admitting: Internal Medicine

## 2016-03-15 ENCOUNTER — Encounter: Payer: Self-pay | Admitting: Internal Medicine

## 2016-03-15 VITALS — BP 114/80 | HR 56 | Temp 97.8°F | Wt 247.0 lb

## 2016-03-15 DIAGNOSIS — R42 Dizziness and giddiness: Secondary | ICD-10-CM | POA: Diagnosis not present

## 2016-03-15 DIAGNOSIS — R51 Headache: Secondary | ICD-10-CM | POA: Diagnosis not present

## 2016-03-15 DIAGNOSIS — R5383 Other fatigue: Secondary | ICD-10-CM | POA: Diagnosis not present

## 2016-03-15 DIAGNOSIS — R519 Headache, unspecified: Secondary | ICD-10-CM

## 2016-03-15 LAB — COMPREHENSIVE METABOLIC PANEL
ALT: 16 U/L (ref 0–53)
AST: 18 U/L (ref 0–37)
Albumin: 4.4 g/dL (ref 3.5–5.2)
Alkaline Phosphatase: 47 U/L (ref 39–117)
BUN: 12 mg/dL (ref 6–23)
CALCIUM: 9.5 mg/dL (ref 8.4–10.5)
CO2: 28 meq/L (ref 19–32)
Chloride: 103 mEq/L (ref 96–112)
Creatinine, Ser: 0.89 mg/dL (ref 0.40–1.50)
GFR: 103.6 mL/min (ref >60.00)
GLUCOSE: 83 mg/dL (ref 70–99)
POTASSIUM: 4.2 meq/L (ref 3.5–5.1)
Sodium: 138 mEq/L (ref 135–145)
Total Bilirubin: 0.6 mg/dL (ref 0.2–1.2)
Total Protein: 7.6 g/dL (ref 6.0–8.3)

## 2016-03-15 LAB — CBC
HCT: 40.1 % (ref 39.0–52.0)
Hemoglobin: 13.9 g/dL (ref 13.0–17.0)
MCHC: 34.7 g/dL (ref 30.0–36.0)
MCV: 92.7 fl (ref 78.0–100.0)
Platelets: 232 10*3/uL (ref 150.0–400.0)
RBC: 4.32 Mil/uL (ref 4.22–5.81)
RDW: 13.6 % (ref 11.5–15.5)
WBC: 6.6 10*3/uL (ref 4.0–10.5)

## 2016-03-15 LAB — TSH: TSH: 1.18 u[IU]/mL (ref 0.35–4.50)

## 2016-03-15 NOTE — Patient Instructions (Signed)

## 2016-03-15 NOTE — Telephone Encounter (Signed)
Pt has appt with R Baity NP 03/15/16 at 3 PM.

## 2016-03-15 NOTE — Progress Notes (Signed)
Pre visit review using our clinic review tool, if applicable. No additional management support is needed unless otherwise documented below in the visit note. 

## 2016-03-15 NOTE — Progress Notes (Signed)
Subjective:    Patient ID: William Stuart, male    DOB: [birth year], 35 y.o.   MRN: [id number]  HPI  Pt presents to the clinic today with c/o headache, dizziness, and fatigue. He reports this started 5 days ago. The headache is located in his forehead and temples. He describes the pain as pressure and tension. He denies pain in his neck or shoulders. He denies blurred vision but has been intermittently dizzy. He describes it as a sense of imbalance. It occurs with exertion and at rest. It is not related to position changes or head movement. He does feel tired during the day. He is only getting about 6 hours of sleep because the kids are waking him up at night. He has not heard that he grinds his teeth or snores. He works out daily and drinks plenty of water. He denies increased stress. He denies fever, chills or body aches. He has not tried anything OTC for this.  Review of Systems  Past Medical History:  Diagnosis Date  . Allergy   . Anxiety   . Asthma   . GERD (gastroesophageal reflux disease)     No current outpatient prescriptions on file.   No current facility-administered medications for this visit.     No Known Allergies  Family History  Problem Relation Age of Onset  . Migraines Mother   . Migraines Father   . Cancer Neg Hx     Social History   Social History  . Marital status: Married    Spouse name: N/A  . Number of children: 2  . Years of education: N/A   Occupational History  . 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 Smoking status: Never Smoker  . Smokeless tobacco: Never Used  . Alcohol use Yes     Comment: occasional  . Drug use: No  . Sexual activity: Not on file   Other Topics Concern  . Not on file   Social History Narrative  . No narrative on file     Constitutional: Pt reports fatigue, headache. Denies fever, malaise, or abrupt weight changes.  HEENT: Denies eye pain, eye  redness, ear pain, ringing in the ears, wax buildup, runny nose, nasal congestion, bloody nose, or sore throat. Respiratory: Denies difficulty breathing, shortness of breath, cough or sputum production.   Cardiovascular: Denies chest pain, chest tightness, palpitations or swelling in the hands or feet.  Gastrointestinal: Denies abdominal pain, bloating, constipation, diarrhea or blood in the stool.  Musculoskeletal: Denies decrease in range of motion, difficulty with gait, muscle pain or joint pain and swelling.  Neurological: Pt reports dizziness. Deniesdifficulty with memory, difficulty with speech or problems with balance and coordination.  Psych: Denies anxiety, depression, SI/HI.  No other specific complaints in a complete review of systems (except as listed in HPI above).     Objective:   Physical Exam  BP 114/80   Pulse (!) 56   Temp 97.8 F (36.6 C) (Oral)   Wt 247 lb (112 kg)   SpO2 98%   BMI 32.15 kg/m  Wt Readings from Last 3 Encounters:  03/15/16 247 lb (112 kg)  04/24/15 242 lb (109.8 kg)  02/27/15 248 lb 8 oz (112.7 kg)    General: Appears his stated age, well developed, well nourished in NAD. HEENT: Head: normal shape and size, no sinus tenderness noted; Eyes: sclera white, no icterus, conjunctiva pink, PERRLA and EOMs intact; Ears:  bilateral cerumen impaction; Throat/Mouth: Teeth present, mucosa pink and moist, no exudate, lesions or ulcerations noted.  Neck:  No adenopathy noted. Cardiovascular: Normal rate and rhythm. S1,S2 noted.  No murmur, rubs or gallops noted.  Pulmonary/Chest: Normal effort and positive vesicular breath sounds. No respiratory distress. No wheezes, rales or ronchi noted.  Neurological: Alert and oriented.     BMET    Component Value Date/Time   NA 137 06/18/2013 1309   K 4.0 06/18/2013 1309   CL 102 06/18/2013 1309   CO2 29 06/18/2013 1309   GLUCOSE 83 06/18/2013 1309   BUN 15 06/18/2013 1309   CREATININE 0.9 06/18/2013 1309    CALCIUM 9.4 06/18/2013 1309   GFRNONAA 109 02/27/2007 1319   GFRAA 132 02/27/2007 1319    Lipid Panel  No results found for: CHOL, TRIG, HDL, CHOLHDL, VLDL, LDLCALC  CBC    Component Value Date/Time   WBC 6.3 06/18/2013 1309   RBC 4.32 06/18/2013 1309   HGB 14.0 06/18/2013 1309   HCT 41.1 06/18/2013 1309   PLT 224.0 06/18/2013 1309   MCV 95.2 06/18/2013 1309   MCHC 34.0 06/18/2013 1309   RDW 12.5 06/18/2013 1309   LYMPHSABS 2.3 06/18/2013 1309   MONOABS 0.6 06/18/2013 1309   EOSABS 0.1 06/18/2013 1309   BASOSABS 0.0 06/18/2013 1309    Hgb A1C No results found for: HGBA1C          Assessment & Plan:   Headache, fatigue and dizziness:  Exam benign ? If it is due to lack of sleep Advised him to increase fluids, rest Will check CBC, CMET and TSH today  Will follow up after labs, follow up with PCP if symptoms persist Chessie Neuharth, NP

## 2016-03-15 NOTE — Telephone Encounter (Signed)
Patient Name: William CyphersCOLIN Alcocer DOB: 1981/03/27 Initial Comment Caller states he is dizzy and fatigued Nurse Assessment Nurse: Roma KayserForsythe, RN, Santina Evansatherine Date/Time (Eastern Time): 03/15/2016 10:54:25 AM Confirm and document reason for call. If symptomatic, describe symptoms. You must click the next button to save text entered. ---caller states he has been feeling dizzy and fatigued over the last 4-5 days off/on and just not going away Has the patient traveled out of the country within the last 30 days? ---No Does the patient have any new or worsening symptoms? ---Yes Will a triage be completed? ---Yes Related visit to physician within the last 2 weeks? ---No Does the PT have any chronic conditions? (i.e. diabetes, asthma, etc.) ---No Is this a behavioral health or substance abuse call? ---No Guidelines Guideline Title Affirmed Question Affirmed Notes Dizziness - Lightheadedness [1] MODERATE dizziness (e.g., interferes with normal activities) AND [2] has NOT been evaluated by physician for this (Exception: dizziness caused by heat exposure, sudden standing, or poor fluid intake) Final Disposition User See Physician within 24 Hours Roma KayserForsythe, RN, Santina Evansatherine Comments 3pm appt today with regina baity Referrals REFERRED TO PCP OFFICE Disagree/Comply: Comply

## 2016-03-17 ENCOUNTER — Encounter: Payer: Self-pay | Admitting: Internal Medicine

## 2016-07-21 ENCOUNTER — Emergency Department
Admission: EM | Admit: 2016-07-21 | Discharge: 2016-07-21 | Disposition: A | Discharge status: Home / Self Care | Payer: BLUE CROSS/BLUE SHIELD | Attending: Emergency Medicine | Admitting: Emergency Medicine

## 2016-07-21 DIAGNOSIS — R42 Dizziness and giddiness: Secondary | ICD-10-CM | POA: Insufficient documentation

## 2016-07-21 DIAGNOSIS — R51 Headache: Secondary | ICD-10-CM | POA: Insufficient documentation

## 2016-07-21 DIAGNOSIS — R519 Headache, unspecified: Secondary | ICD-10-CM

## 2016-07-21 DIAGNOSIS — J45909 Unspecified asthma, uncomplicated: Secondary | ICD-10-CM | POA: Diagnosis not present

## 2016-07-21 LAB — URINALYSIS, COMPLETE (UACMP) WITH MICROSCOPIC
Bacteria, UA: NONE SEEN
Bilirubin Urine: NEGATIVE
GLUCOSE, UA: NEGATIVE mg/dL
Hgb urine dipstick: NEGATIVE
Ketones, ur: NEGATIVE mg/dL
Leukocytes, UA: NEGATIVE
Nitrite: NEGATIVE
PH: 6 (ref 5.0–8.0)
Protein, ur: NEGATIVE mg/dL
SPECIFIC GRAVITY, URINE: 1.006 (ref 1.005–1.030)
SQUAMOUS EPITHELIAL / LPF: NONE SEEN

## 2016-07-21 LAB — BASIC METABOLIC PANEL
Anion gap: 6 (ref 5–15)
BUN: 13 mg/dL (ref 6–20)
CHLORIDE: 105 mmol/L (ref 101–111)
CO2: 26 mmol/L (ref 22–32)
CREATININE: 0.97 mg/dL (ref 0.61–1.24)
Calcium: 8.9 mg/dL (ref 8.9–10.3)
GFR calc Af Amer: >60 mL/min (ref >60)
GFR calc non Af Amer: >60 mL/min (ref >60)
Glucose, Bld: 76 mg/dL (ref 65–99)
Potassium: 4 mmol/L (ref 3.5–5.1)
SODIUM: 137 mmol/L (ref 135–145)

## 2016-07-21 LAB — CBC
HCT: 40 % (ref 40.0–52.0)
Hemoglobin: 14.1 g/dL (ref 13.0–18.0)
MCH: 32.9 pg (ref 26.0–34.0)
MCHC: 35.3 g/dL (ref 32.0–36.0)
MCV: 93.2 fL (ref 80.0–100.0)
PLATELETS: 200 10*3/uL (ref 150–440)
RBC: 4.3 MIL/uL — ABNORMAL LOW (ref 4.40–5.90)
RDW: 12.9 % (ref 11.5–14.5)
WBC: 6.8 10*3/uL (ref 3.8–10.6)

## 2016-07-21 MED ORDER — IBUPROFEN 800 MG PO TABS: 800.0000 mg | ORAL_TABLET | Freq: Three times a day (TID) | ORAL | 0 refills | Status: DC | PRN

## 2016-07-21 MED ORDER — IBUPROFEN 800 MG PO TABS
800.0000 mg | ORAL_TABLET | Freq: Once | ORAL | Status: AC
Start: 2016-07-21 — End: 2016-07-21
  Administered 2016-07-21: 800 mg via ORAL
  Filled 2016-07-21: qty 1

## 2016-07-21 NOTE — ED Provider Notes (Signed)
[facility] Emergency Department Provider Note  ____________________________________________  Time seen: Approximately 8:13 PM  I have reviewed the triage vital signs and the nursing notes.   HISTORY  Chief Complaint Headache and Dizziness    HPI William Stuart is a 35 y.o. male presenting for 1 month of headaches, tonight with lightheadedness. The patient reports that every single day for the past month he has had a constant headache. It started in the right temporal area, now is "all over." It is not associated with nausea or vomiting, visual changes, speech changes, numbness tingling or weakness. He has not had any tick bites, travel outside denies states, or trauma. He has tried Excedrin Migraine with no change. No fevers or chills. Tonight, the patient noted that with his headache he had an episode of lightheadedness with standing.   FH: Family history of cerebral aneurysm or brain tumor.  Past Medical History:  Diagnosis Date  . Allergy   . Anxiety   . Asthma   . GERD (gastroesophageal reflux disease)     Patient Active Problem List   Diagnosis Date Noted  . Palpitations 04/24/2015  . Anal pain 02/27/2015  . Anal itching 02/27/2015  . Pressure in head 08/05/2014  . ANXIETY 04/30/2007  . ALLERGIC RHINITIS 02/27/2007  . ASTHMA 02/27/2007    Past Surgical History:  Procedure Laterality Date  . ORCHIECTOMY     L orchiectomy and R orchiopexy for torsion   2002  . PILONIDAL CYST EXCISION  2000 & 2001   3 times      Allergies Patient has no known allergies.  Family History  Problem Relation Age of Onset  . Migraines Mother   . Migraines Father   . Cancer Neg Hx     Social History Social History  Substance Use Topics  . Smoking status: Never Smoker  . Smokeless tobacco: Never Used  . Alcohol use Yes     Comment: occasional    Review of Systems Constitutional: No fever/chills.Positive lightheadedness. Negative syncope. Eyes:  No visual changes. No blurred or double vision. ENT: No sore throat. No congestion or rhinorrhea. Cardiovascular: Denies chest pain. Denies palpitations. Respiratory: Denies shortness of breath.  No cough. Gastrointestinal: No abdominal pain.  No nausea, no vomiting.  No diarrhea.  No constipation. Genitourinary: Negative for dysuria. Musculoskeletal: Negative for back pain. Skin: Negative for rash. Neurological: Positive for headaches. No focal numbness, tingling or weakness. No visual changes, speech changes, mental status changes. No difficult in walking.  10-point ROS otherwise negative.  ____________________________________________   PHYSICAL EXAM:  VITAL SIGNS: ED Triage Vitals  Enc Vitals Group     BP 07/21/16 1837 138/74     Pulse Rate 07/21/16 1837 68     Resp 07/21/16 1837 16     Temp 07/21/16 1837 98.6 F (37 C)     Temp Source 07/21/16 1837 Oral     SpO2 07/21/16 1837 99 %     Weight 07/21/16 1837 240 lb (108.9 kg)     Height 07/21/16 1837 6\' 2"  (1.88 m)     Head Circumference --      Peak Flow --      Pain Score 07/21/16 1840 4     Pain Loc --      Pain Edu? --      Excl. in GC? --     Constitutional: Alert and oriented. Well appearing and in no acute distress. Answers questions appropriately. Eyes: Conjunctivae are normal.  EOMI. PERRLA.  No scleral icterus. Head: Atraumatic. No pain over the temples bilaterally. Nose: No congestion/rhinnorhea. Mouth/Throat: Mucous membranes are moist.  Neck: No stridor.  Supple.  No JVD. No meningismus. Cardiovascular: Normal rate, regular rhythm. No murmurs, rubs or gallops.  Respiratory: Normal respiratory effort.  No accessory muscle use or retractions. Lungs CTAB.  No wheezes, rales or ronchi. Gastrointestinal: Soft, nontender and nondistended.  No guarding or rebound.  No peritoneal signs. Musculoskeletal: No LE edema. No ttp in the calves or palpable cords.  Negative Homan's sign. Neurologic: Alert and oriented 3.  Speech is clear.  Face and smile symmetric. Tongue is midline. EOMI. PERRLA. No vertical or horizontal nystagmus. No pronator drift. 5 out of 5 grip, biceps, triceps, hip flexors, plantar flexion and dorsiflexion. Normal sensation to light touch in the bilateral upper and lower extremities, and face. Normal gait without ataxia. Skin:  Skin is warm, dry and intact. No rash noted. Psychiatric: Mood and affect are normal. Speech and behavior are normal.  Normal judgement.  ____________________________________________   LABS (all labs ordered are listed, but only abnormal results are displayed)  Labs Reviewed  CBC - Abnormal; Notable for the following:       Result Value   RBC 4.30 (*)    All other components within normal limits  URINALYSIS, COMPLETE (UACMP) WITH MICROSCOPIC - Abnormal; Notable for the following:    Color, Urine STRAW (*)    APPearance CLEAR (*)    All other components within normal limits  BASIC METABOLIC PANEL  CBG MONITORING, ED   ____________________________________________  EKG  ED ECG REPORT I, Rockne MenghiniNorman, Anne-Caroline, the attending physician, personally viewed and interpreted this ECG.   Date: 07/21/2016  EKG Time: 1843  Rate: 66  Rhythm: normal sinus rhythm  Axis: normal  Intervals:none  ST&T Change: Nonspecific T-wave inversion in V1. No ST elevation  ____________________________________________  RADIOLOGY  No results found.  ____________________________________________   PROCEDURES  Procedure(s) performed: None  Procedures  Critical Care performed: No ____________________________________________   INITIAL IMPRESSION / ASSESSMENT AND PLAN / ED COURSE  Pertinent labs & imaging results that were available during my care of the patient were reviewed by me and considered in my medical decision making (see chart for details).  35 y.o. male with a daily headache for the past month, now with an episode of lightheadedness with standing tonight.  On my examination, the patient has reassuring vital signs, and a completely normal neurologic examination. I will give the patient Motrin for his headache, as the only medication is tried his Excedrin Migraine. We'll get orthostatic vital signs on the patient. I anticipate discharge home as the patient's laboratory studies are additionally reassuring. There is no indication for imaging at this time, but I have encouraged the patient to follow up with his primary care physician if his symptoms do not improve.  ____________________________________________  FINAL CLINICAL IMPRESSION(S) / ED DIAGNOSES  Final diagnoses:  Nonintractable headache, unspecified chronicity pattern, unspecified headache type  Postural lightheadedness    Clinical Course       NEW MEDICATIONS STARTED DURING THIS VISIT:  New Prescriptions   IBUPROFEN (ADVIL,MOTRIN) 800 MG TABLET    Take 1 tablet (800 mg total) by mouth every 8 (eight) hours as needed.      Rockne MenghiniAnne-Caroline Coron Rossano, MD 07/21/16 2023

## 2016-07-21 NOTE — Discharge Instructions (Signed)
You may take Tylenol or Motrin for ear pain. Please drink plenty of water to stay well-hydrated.  Return to the emergency department if you develop lightheadedness, fainting, numbness tingling or weakness, severe pain, inability to keep down fluids, fever, or any other symptoms concerning to you.

## 2016-07-21 NOTE — ED Triage Notes (Signed)
Pt c/o dizziness episode this AM and headache X 1 month. Pt alert and oriented X4, active, cooperative, pt in NAD. RR even and unlabored, color WNL.  Pt drove himself here, denies LOC.

## 2016-07-25 ENCOUNTER — Telehealth: Payer: Self-pay

## 2016-07-25 NOTE — Telephone Encounter (Signed)
Left message to call office to follow-up on ER visit recently to see how he was doing

## 2016-07-29 ENCOUNTER — Encounter: Payer: Self-pay | Admitting: Family Medicine

## 2016-07-29 ENCOUNTER — Ambulatory Visit (INDEPENDENT_AMBULATORY_CARE_PROVIDER_SITE_OTHER): Payer: BLUE CROSS/BLUE SHIELD | Admitting: Family Medicine

## 2016-07-29 DIAGNOSIS — R42 Dizziness and giddiness: Secondary | ICD-10-CM | POA: Insufficient documentation

## 2016-07-29 DIAGNOSIS — G8929 Other chronic pain: Secondary | ICD-10-CM

## 2016-07-29 DIAGNOSIS — G44229 Chronic tension-type headache, not intractable: Secondary | ICD-10-CM | POA: Insufficient documentation

## 2016-07-29 DIAGNOSIS — R519 Headache, unspecified: Secondary | ICD-10-CM

## 2016-07-29 DIAGNOSIS — R51 Headache: Secondary | ICD-10-CM

## 2016-07-29 NOTE — Progress Notes (Signed)
Pre visit review using our clinic review tool, if applicable. No additional management support is needed unless otherwise documented below in the visit note. 

## 2016-07-29 NOTE — Assessment & Plan Note (Signed)
Resolved

## 2016-07-29 NOTE — Assessment & Plan Note (Signed)
No red flags. Nml neuro exam. No clear indication for imaging. HA are improving not worsening now.  Not clearly migraine but possible family history of migraine. Possibly stress and anxiety related.  keep headache diary. Use NSAID prn pain severe, limit.  Avoid migraine triggers if noted.  Will refer to headache clinic given pt level of concern.

## 2016-07-29 NOTE — Progress Notes (Signed)
   Subjective:    Patient ID: William Stuart, male    DOB: 11/11/1980, 35 y.o.   MRN: [id number]  HPI  35 year old male pt of Dr. Alphonsus SiasLetvak presents for ER follow up for headache and dizziness ( balance issue to right).   ER visit on 07/21/2016 HPI per ED note: daily headache x 1 month, constant, along with lightheadedness BMET, UA, cbc, EKG nml Treated with ibuprofen 800 g x 1 helped some.  Per pt since then:  He reports that his headache is now improved some. Off and on headache, more mild. He has noted some fogginess of brain off and on when running. He no longer has dizziness or balance issues.  No slurred speech, no numbess, no weakness, no vision changes.  He was seen for headache dizziness and fatigue in 03/2016: labs nml:TSH  and LFTSnml encouraged adequate rest and   Has history of off and on HA in past. HAs seen PCP for this in past. Right temporal headache that spread to rest of headache, crushing pressure, throbbing some. No nausea. No photophonophobia.  Ibuprofen helps some with pain. May be better now with decrease in stress.  FH:  No cerebral aneurysm or brain tumor, MGF sudden death in sleep not sure cause  Father and mother with headache, possible  Father with temporal arteritis Review of Systems     Vitals:   07/29/16 1432  BP: 110/60  Pulse: 64  Temp: 97.8 F (36.6 C)   Objective:   Physical Exam  Constitutional: He is oriented to person, place, and time. Vital signs are normal. He appears well-developed and well-nourished.  HENT:  Head: Normocephalic.  Right Ear: Hearing normal.  Left Ear: Hearing normal.  Nose: Nose normal.  Mouth/Throat: Oropharynx is clear and moist and mucous membranes are normal.  Eyes: EOM are normal. Pupils are equal, round, and reactive to light.  Fundoscopic exam:      The right eye shows no papilledema.       The left eye shows no papilledema.  Neck: Trachea normal. Carotid bruit is not present. No thyroid mass and no  thyromegaly present.  Cardiovascular: Normal rate, regular rhythm and normal pulses.  Exam reveals no gallop, no distant heart sounds and no friction rub.   No murmur heard. No peripheral edema  Pulmonary/Chest: Effort normal and breath sounds normal. No respiratory distress.  Neurological: He is oriented to person, place, and time. He has normal strength. No cranial nerve deficit or sensory deficit. He displays a negative Romberg sign. Coordination and gait normal. GCS eye subscore is 4. GCS verbal subscore is 5. GCS motor subscore is 6.  Skin: Skin is warm, dry and intact. No rash noted.  Psychiatric: He has a normal mood and affect. His speech is normal and behavior is normal. Thought content normal.         Assessment & Plan:

## 2016-07-29 NOTE — Patient Instructions (Addendum)
Call  If headaches worsen.. Go to ER with severe pain.  Work on stress reduction, relaxation, good sleep, healthy lifestyle and regular exercise. Keep headache diary.  Avoid migraine triggers if noted to cause HA.  Stop at front desk for referral to neurology for further evaluation.

## 2016-08-29 DIAGNOSIS — G44229 Chronic tension-type headache, not intractable: Secondary | ICD-10-CM | POA: Diagnosis not present

## 2016-09-26 DIAGNOSIS — R51 Headache: Secondary | ICD-10-CM | POA: Diagnosis not present

## 2016-09-26 DIAGNOSIS — G479 Sleep disorder, unspecified: Secondary | ICD-10-CM | POA: Insufficient documentation

## 2016-11-02 ENCOUNTER — Telehealth: Payer: Self-pay | Admitting: Internal Medicine

## 2016-11-02 ENCOUNTER — Ambulatory Visit: Payer: Self-pay | Admitting: *Deleted

## 2016-11-02 DIAGNOSIS — IMO0001 Reserved for inherently not codable concepts without codable children: Secondary | ICD-10-CM

## 2016-11-02 LAB — POCT CBG (FASTING - GLUCOSE)-MANUAL ENTRY: Glucose Fasting, POC: 91 mg/dL (ref 70–99)

## 2016-11-02 NOTE — Telephone Encounter (Signed)
Pt has appt with  R Baity NP 11/03/16 at 9 AM.

## 2016-11-02 NOTE — Telephone Encounter (Signed)
Patient Name: William CyphersCOLIN Stuart DOB: 09/02/1980 Initial Comment Caller states he feels like he's having Blood sugar issues, dizziness, always hungry. Nurse Assessment Nurse: Clarita LeberWomble, RN, Deborah Date/Time (Eastern Time): 11/02/2016 9:15:32 AM Confirm and document reason for call. If symptomatic, describe symptoms. ---The caller states that he has had light headedness and hungry all the time. He does not recall when he last saw the physician. No change in weight. Does the patient have any new or worsening symptoms? ---Yes Will a triage be completed? ---Yes Related visit to physician within the last 2 weeks? ---No Does the PT have any chronic conditions? (i.e. diabetes, asthma, etc.) ---No Is this a behavioral health or substance abuse call? ---No Guidelines Guideline Title Affirmed Question Affirmed Notes Dizziness - Lightheadedness [1] MODERATE dizziness (e.g., interferes with normal activities) AND [2] has NOT been evaluated by physician for this (Exception: dizziness caused by heat exposure, sudden standing, or poor fluid intake) Final Disposition User See Physician within 24 Hours Womble, RN, Gavin PoundDeborah Comments The caller states that he cannot remember the last time that he has seen his PCP. He states that he is having episodes of light headedness and thinks that his blood sugar is low. He states that he works at [employer] and that [facility] can [facility] and have his BS checked. Called the caller back and gave appointment time of 9 am [facility] in the am. He was advised to go [facility] [employer] where he works to see what his current blood sugar is and to call back if it is outside of the normal range and he verbalizes understanding. Referrals REFERRED TO PCP OFFICE

## 2016-11-03 ENCOUNTER — Encounter: Payer: Self-pay | Admitting: Internal Medicine

## 2016-11-03 ENCOUNTER — Ambulatory Visit (INDEPENDENT_AMBULATORY_CARE_PROVIDER_SITE_OTHER): Payer: BLUE CROSS/BLUE SHIELD | Admitting: Internal Medicine

## 2016-11-03 VITALS — BP 120/66 | HR 66 | Temp 97.8°F | Wt 253.0 lb

## 2016-11-03 DIAGNOSIS — R632 Polyphagia: Secondary | ICD-10-CM

## 2016-11-03 DIAGNOSIS — H539 Unspecified visual disturbance: Secondary | ICD-10-CM

## 2016-11-03 DIAGNOSIS — R5383 Other fatigue: Secondary | ICD-10-CM | POA: Diagnosis not present

## 2016-11-03 DIAGNOSIS — R42 Dizziness and giddiness: Secondary | ICD-10-CM

## 2016-11-03 LAB — COMPREHENSIVE METABOLIC PANEL
ALK PHOS: 48 U/L (ref 39–117)
ALT: 17 U/L (ref 0–53)
AST: 20 U/L (ref 0–37)
Albumin: 4.5 g/dL (ref 3.5–5.2)
BUN: 11 mg/dL (ref 6–23)
CALCIUM: 9.4 mg/dL (ref 8.4–10.5)
CO2: 30 mEq/L (ref 19–32)
Chloride: 104 mEq/L (ref 96–112)
Creatinine, Ser: 1.01 mg/dL (ref 0.40–1.50)
GFR: 89.2 mL/min (ref >60.00)
GLUCOSE: 64 mg/dL — AB (ref 70–99)
POTASSIUM: 4.1 meq/L (ref 3.5–5.1)
Sodium: 139 mEq/L (ref 135–145)
TOTAL PROTEIN: 7.7 g/dL (ref 6.0–8.3)
Total Bilirubin: 0.8 mg/dL (ref 0.2–1.2)

## 2016-11-03 LAB — TSH: TSH: 1.14 u[IU]/mL (ref 0.35–4.50)

## 2016-11-03 LAB — HEMOGLOBIN A1C: Hgb A1c MFr Bld: 5.2 % (ref 4.6–6.5)

## 2016-11-03 NOTE — Patient Instructions (Signed)

## 2016-11-03 NOTE — Progress Notes (Signed)
Subjective:    Patient ID: William Stuart, male    DOB: 1980-11-14, 36 y.o.   MRN: [id number]  HPI  Pt presents to the clinic today with c/o increased hunger, visual changes and lightheadedness. This started 2 weeks ago. The symptoms went away and came back 2 days ago. It is intermittent. He feels hungry about 1 hour after eating. He denies nausea, vomiting, diarrhea, constipation or weight loss. He has not had an eye exam in > 1 year. He has been following neurology for lightheadedness/dizziness, last note from 09/2016 reviewed. He thinks it may be related to tension headaches and OSA. He is going for his sleep study next week. The excessive hunger is new for him. He denies changes in diet or medication. He denies recent travel. He feels like he maybe having an issue with his blood sugar. He went to the clinic at [facility] yesterday, they checked his blood sugar. It was 91 mg/dL. He has no family history of DM 2.  Review of Systems      Past Medical History:  Diagnosis Date  . Allergy   . Anxiety   . Asthma   . GERD (gastroesophageal reflux disease)     Current Outpatient Prescriptions  Medication Sig Dispense Refill  . ibuprofen (ADVIL,MOTRIN) 800 MG tablet Take 1 tablet (800 mg total) by mouth every 8 (eight) hours as needed. 20 tablet 0   No current facility-administered medications for this visit.     No Known Allergies  Family History  Problem Relation Age of Onset  . Migraines Mother   . Migraines Father   . Cancer Neg Hx     Social History   Social History  . Marital status: Married    Spouse name: N/A  . Number of children: 2  . Years of education: N/A   Occupational History  . 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 Smoking status: Never Smoker  . Smokeless tobacco: Never Used  . Alcohol use Yes     Comment: occasional  . Drug use: No  . Sexual activity: Not on file   Other Topics Concern  . Not  on file   Social History Narrative  . No narrative on file     Constitutional: Pt reports fatigue. Denies fever, malaise, headache or abrupt weight changes.  HEENT: Pt reports seeing floaters. Denies eye pain, eye redness, ear pain, ringing in the ears, wax buildup, runny nose, nasal congestion, bloody nose, or sore throat. Respiratory: Denies difficulty breathing, shortness of breath, cough or sputum production.   Cardiovascular: Denies chest pain, chest tightness, palpitations or swelling in the hands or feet.  Gastrointestinal: Pt reports excessive hunger. Denies abdominal pain, bloating, constipation, diarrhea or blood in the stool.  GU: Denies urgency, frequency, pain with urination, burning sensation, blood in urine, odor or discharge. Musculoskeletal: Denies decrease in range of motion, difficulty with gait, muscle pain or joint pain and swelling.  Skin: Denies redness, rashes, lesions or ulcercations.  Neurological: Pt reports lightheadedness/dizziness. Denies difficulty with memory, difficulty with speech or problems with balance and coordination.  Psych: Pt has history of anxieyy. Denies depression, SI/HI.  No other specific complaints in a complete review of systems (except as listed in HPI above).  Objective:   Physical Exam   BP 120/66   Pulse 66   Temp 97.8 F (36.6 C) (Oral)   Wt 253 lb (114.8 kg)   SpO2 97%  BMI 32.48 kg/m  Wt Readings from Last 3 Encounters:  11/03/16 253 lb (114.8 kg)  07/29/16 252 lb 4 oz (114.4 kg)  07/21/16 240 lb (108.9 kg)    General: Appears his stated age, well developed, well nourished in NAD. HEENT: Head: normal shape and size; Eyes: sclera white, no icterus, conjunctiva pink, PERRLA and EOMs intact;  Abdomen: Soft and nontender. Normal bowel sounds. No distention or masses noted. Neurological: Alert and oriented.   BMET    Component Value Date/Time   NA 137 07/21/2016 1842   K 4.0 07/21/2016 1842   CL 105 07/21/2016 1842    CO2 26 07/21/2016 1842   GLUCOSE 76 07/21/2016 1842   BUN 13 07/21/2016 1842   CREATININE 0.97 07/21/2016 1842   CALCIUM 8.9 07/21/2016 1842   GFRNONAA >60 07/21/2016 1842   GFRAA >60 07/21/2016 1842    Lipid Panel  No results found for: CHOL, TRIG, HDL, CHOLHDL, VLDL, LDLCALC  CBC    Component Value Date/Time   WBC 6.8 07/21/2016 1842   RBC 4.30 (L) 07/21/2016 1842   HGB 14.1 07/21/2016 1842   HCT 40.0 07/21/2016 1842   PLT 200 07/21/2016 1842   MCV 93.2 07/21/2016 1842   MCH 32.9 07/21/2016 1842   MCHC 35.3 07/21/2016 1842   RDW 12.9 07/21/2016 1842   LYMPHSABS 2.3 06/18/2013 1309   MONOABS 0.6 06/18/2013 1309   EOSABS 0.1 06/18/2013 1309   BASOSABS 0.0 06/18/2013 1309    Hgb A1C No results found for: HGBA1C         Assessment & Plan:   Fatigue, excessive hunger, visual changes, lightheadedness:  Exam benign Will check CMET, TSH and A1C today He will continue to follow with neurology for lightheadedness Encouraged him to make an appt for an eye exam Encouraged him to stay well hydrated and get adequate rest He will keep his appt for his sleep study next week  Will follow up after labs, RTC as needed Nicki ReaperBAITY, Leotis Isham, NP

## 2016-11-09 ENCOUNTER — Ambulatory Visit: Payer: BLUE CROSS/BLUE SHIELD | Attending: Internal Medicine

## 2016-11-09 DIAGNOSIS — G4733 Obstructive sleep apnea (adult) (pediatric): Secondary | ICD-10-CM | POA: Diagnosis not present

## 2016-11-24 DIAGNOSIS — G4733 Obstructive sleep apnea (adult) (pediatric): Secondary | ICD-10-CM | POA: Diagnosis not present

## 2016-12-26 DIAGNOSIS — G44229 Chronic tension-type headache, not intractable: Secondary | ICD-10-CM | POA: Diagnosis not present

## 2016-12-26 DIAGNOSIS — G479 Sleep disorder, unspecified: Secondary | ICD-10-CM | POA: Diagnosis not present

## 2017-01-10 ENCOUNTER — Ambulatory Visit: Payer: BLUE CROSS/BLUE SHIELD | Attending: Neurology

## 2017-01-10 DIAGNOSIS — G4733 Obstructive sleep apnea (adult) (pediatric): Secondary | ICD-10-CM | POA: Insufficient documentation

## 2017-01-12 DIAGNOSIS — H40033 Anatomical narrow angle, bilateral: Secondary | ICD-10-CM | POA: Diagnosis not present

## 2017-01-13 DIAGNOSIS — G4733 Obstructive sleep apnea (adult) (pediatric): Secondary | ICD-10-CM | POA: Diagnosis not present

## 2017-02-02 ENCOUNTER — Ambulatory Visit: Payer: Self-pay | Admitting: Medical

## 2017-02-02 ENCOUNTER — Encounter: Payer: Self-pay | Admitting: Medical

## 2017-02-02 DIAGNOSIS — H6123 Impacted cerumen, bilateral: Secondary | ICD-10-CM

## 2017-02-02 DIAGNOSIS — H60333 Swimmer's ear, bilateral: Secondary | ICD-10-CM

## 2017-02-02 MED ORDER — NEOMYCIN-POLYMYXIN-HC 3.5-10000-1 OT SOLN: 4.0000 [drp] | Freq: Four times a day (QID) | OTIC | 0 refills | Status: AC

## 2017-02-02 NOTE — Progress Notes (Signed)
Subjective:    Patient ID: William Stuart, male    DOB: 18-Dec-1980, 36 y.o.   MRN: [id number]      Patient is a 36 yo male who complains of pain in his right Ear that radiates to his jaw when touching his ear.  Patient reports swimming in the past month. He denies any fever , chills, nausea, vomiting or difficulty swallowing. He denies any recent infections or recent exposures. He denies any facial swelling, redness or change  in skin. He denies any change in his normal activities.   Review of Systems  Constitutional: Negative for activity change, appetite change, chills, diaphoresis, fatigue and fever.  HENT: Positive for congestion (mild per patient) and ear pain. Negative for drooling, ear discharge, facial swelling, mouth sores, postnasal drip, rhinorrhea, sinus pain, sore throat and trouble swallowing.   Eyes: Negative for discharge and itching.  Respiratory: Negative for cough, chest tightness and shortness of breath.   Cardiovascular: Negative for chest pain.  Gastrointestinal: Negative for abdominal pain, diarrhea, nausea and vomiting.  Endocrine: Negative for polydipsia, polyphagia and polyuria.  Genitourinary: Negative for difficulty urinating.  Musculoskeletal: Negative for arthralgias, back pain, joint swelling and neck stiffness.  Skin: Negative for color change.  Allergic/Immunologic: Negative for immunocompromised state.  Neurological: Negative for dizziness, speech difficulty, light-headedness and headaches.  Hematological: Does not bruise/bleed easily.  Psychiatric/Behavioral: Negative for agitation, behavioral problems and confusion.       Objective:   Physical Exam  Constitutional: He appears well-developed and well-nourished. He is active.  Non-toxic appearance. He does not have a sickly appearance. He does not appear ill. No distress.  HENT:  Head: Normocephalic and atraumatic.  Right Ear: Hearing normal. There is tenderness (tenderness to tragus with touch/  mild erythema ear canal ). No drainage or swelling. No mastoid tenderness. No decreased hearing is noted.  Left Ear: Hearing and external ear normal. No drainage, swelling or tenderness. No mastoid tenderness. No decreased hearing is noted.  Nose: Mucosal edema and rhinorrhea present. No sinus tenderness.  Mouth/Throat: Uvula is midline and mucous membranes are normal. Normal dentition. Posterior oropharyngeal erythema present. No oropharyngeal exudate, posterior oropharyngeal edema or tonsillar abscesses.  Was unable to visualize tympanic membrane bilaterally prior to ear irrigation by nurse.   After irrigation:  Left mild erythema canal, Tympanic membrane normal in appearance partially obscured by cerumen due to moist cerumen   Right mild to moderate erythema to canal, moist cerumen completely obscuring view of tympanic membrane.   Eyes: Conjunctivae, EOM and lids are normal. Pupils are equal, round, and reactive to light.  Neck: Trachea normal, normal range of motion and full passive range of motion without pain. Neck supple. No JVD present.  Cardiovascular: Normal rate, regular rhythm, S1 normal, S2 normal and normal heart sounds.  Exam reveals no gallop, no distant heart sounds and no friction rub.   Pulmonary/Chest: Effort normal and breath sounds normal. No apnea. No respiratory distress. He has no wheezes. He has no rales.  Lymphadenopathy:       Head (right side): No submental, no submandibular, no tonsillar, no preauricular, no posterior auricular and no occipital adenopathy present.       Head (left side): No submental, no submandibular, no tonsillar, no preauricular, no posterior auricular and no occipital adenopathy present.       Right cervical: No superficial cervical and no posterior cervical adenopathy present.      Left cervical: No superficial cervical and no posterior cervical adenopathy present.  Neurological: He is alert.  Skin: Skin is warm, dry and intact. He is not  diaphoretic. No pallor.  Psychiatric: He has a normal mood and affect. His speech is normal and behavior is normal. Judgment and thought content normal. Cognition and memory are normal.  Nursing note and vitals reviewed.         Assessment & Plan:  1. Bilateral Otitis Externa  Treatment:  Use prescription ear drops below as instructed. OTC Motrin per package instructions.  Water exclusion from affected ear until symptoms resolve. Follow up in 3 days if if symptoms not improving or sooner if symptoms change or worsen.     Meds ordered this encounter  Medications  . neomycin-polymyxin-hydrocortisone (CORTISPORIN) OTIC solution    Sig: Place 4 drops into both ears 4 (four) times daily.    Dispense:  5.6 mL    Refill:  0

## 2017-02-02 NOTE — Patient Instructions (Signed)
Otitis Externa Otitis externa is an infection of the outer ear canal. The outer ear canal is the area between the outside of the ear and the eardrum. Otitis externa is sometimes called "swimmer's ear." Follow these instructions at home:  If you were given antibiotic ear drops, use them as told by your doctor. Do not stop using them even if your condition gets better.  Take over-the-counter and prescription medicines only as told by your doctor.  Keep all follow-up visits as told by your doctor. This is important. How is this prevented?  Keep your ear dry. Use the corner of a towel to dry your ear after you swim or bathe.  Try not to scratch or put things in your ear. Doing these things makes it easier for germs to grow in your ear.  Avoid swimming in lakes, dirty water, or pools that may not have the right amount of a chemical called chlorine.  Consider making ear drops and putting 3 or 4 drops in each ear after you swim. Ask your doctor about how you can make ear drops. Contact a doctor if:  You have a fever.  After 3 days your ear is still red, swollen, or painful.  After 3 days you still have pus coming from your ear.  Your redness, swelling, or pain gets worse.  You have a really bad headache.  You have redness, swelling, pain, or tenderness behind your ear. This information is not intended to replace advice given to you by your health care provider. Make sure you discuss any questions you have with your health care provider. Document Released: 01/11/2008 Document Revised: 08/20/2015 Document Reviewed: 05/04/2015 Elsevier Interactive Patient Education  2018 Elsevier Inc.  

## 2017-02-02 NOTE — Addendum Note (Signed)
Addended by: Berniece PapFLINCHUM, Danaly Bari S on: 02/02/2017 02:43 PM   Modules accepted: Orders

## 2017-02-05 DIAGNOSIS — H60393 Other infective otitis externa, bilateral: Secondary | ICD-10-CM | POA: Diagnosis not present

## 2017-02-05 DIAGNOSIS — H6691 Otitis media, unspecified, right ear: Secondary | ICD-10-CM | POA: Diagnosis not present

## 2017-02-10 ENCOUNTER — Encounter: Payer: Self-pay | Admitting: Medical

## 2017-02-10 ENCOUNTER — Ambulatory Visit: Payer: Self-pay | Admitting: Medical

## 2017-02-10 VITALS — BP 105/80 | HR 89 | Resp 16 | Ht 75.0 in | Wt 261.0 lb

## 2017-02-10 DIAGNOSIS — H1011 Acute atopic conjunctivitis, right eye: Secondary | ICD-10-CM

## 2017-02-10 MED ORDER — GENTAMICIN SULFATE 0.3 % OP SOLN: 1.0000 [drp] | OPHTHALMIC | 0 refills | Status: DC

## 2017-02-10 NOTE — Progress Notes (Signed)
   Subjective:    Patient ID: William Stuart, male    DOB: 03/20/1981, 36 y.o.   MRN: [id number]  HPI 36 yo seen last week diagnosis with cerumen impaction and  Otitis externa.  Went on vacation and  had more ear pain and right side of head on the right   Which radiated to right side of jaw. Seen by urgent care on Saturday and placed on Omnicef has been taking it fot 5 and half days. Stopped the prescription ear drops at the beginning of oral medication.  Review of Systems  Constitutional: Negative for chills and fever.  HENT: Positive for ear discharge, ear pain and hearing loss.   Respiratory: Negative for cough and shortness of breath.   Cardiovascular: Negative for chest pain.  Gastrointestinal: Negative for abdominal distention.  Genitourinary: Negative for hematuria.  Musculoskeletal: Negative for myalgias.  Skin: Negative for rash.   Right ear less painful and able to hear out of it and left ear more painful and trouble hearing out of it.    Objective:   Physical Exam  Constitutional: He appears well-developed and well-nourished.  HENT:  Head: Normocephalic and atraumatic.  Right Ear: There is swelling. No mastoid tenderness. A middle ear effusion is present.  Left Ear: There is swelling. No mastoid tenderness.  Eyes: EOM are normal. Pupils are equal, round, and reactive to light. Right conjunctiva is injected.  Nursing note and vitals reviewed.  Right canal open and draining wax, almost gone, TM dull. TMs bilaterally dark. Mostly obscured on the left side from wax and otitis externa (swelling of the canal).      Assessment & Plan:  Otitis externa bilateral left worse than right.  Continue Omnicef and begin using prescription ear drops Ciprodex again as prescribed.Explained to patient that it had steroid and antibioitcs in the otic drops and will help with the swellling of the canal. Return to clinic on Monday for a recheck. Allergic conjunctivitis Gentamicin opth  drops0.3% one drop in right eye every  4 hours while awake for   5-7 days. Explained to patient to mark drops so he does not confuse ear with the eye drops. Verbalizes understanding and has no questions at discharge.

## 2017-02-13 ENCOUNTER — Ambulatory Visit: Payer: Self-pay | Admitting: Medical

## 2017-02-13 ENCOUNTER — Encounter: Payer: Self-pay | Admitting: Medical

## 2017-02-13 VITALS — BP 120/90 | HR 101 | Temp 96.8°F | Resp 18 | Ht 74.0 in | Wt 261.2 lb

## 2017-02-13 DIAGNOSIS — Z8669 Personal history of other diseases of the nervous system and sense organs: Secondary | ICD-10-CM

## 2017-02-13 DIAGNOSIS — H60502 Unspecified acute noninfective otitis externa, left ear: Secondary | ICD-10-CM

## 2017-02-13 DIAGNOSIS — H6983 Other specified disorders of Eustachian tube, bilateral: Secondary | ICD-10-CM

## 2017-02-13 NOTE — Patient Instructions (Addendum)
Take otc  zyrtec -D or claritin -D take as directed . Use debrox in right ear  X 4 days. Continue antibiotic drops in left ear.Finish oral antibiotics. Follow up in 7-10 days sooner if  Any concerns.

## 2017-02-13 NOTE — Progress Notes (Signed)
   Subjective:    Patient ID: William NievesColin M Paar, male    DOB: Dec 30, 1980, 36 y.o.   MRN: [id number]  HPI  Rode bike in 2  miles to clinic.  36 yo male Returns to clinic ,  Feeling right ear is better, left ear better some. Happy he is feeling better. Did not need to take anything for pain this weekeend. Did not pick up eye drops, Eye with no redness today. Review of Systems  Constitutional: Negative for chills and fever.  HENT: Positive for ear pain. Negative for sore throat.   Eyes: Negative for discharge and itching.  Respiratory: Negative for cough.   Cardiovascular: Negative for chest pain.  Gastrointestinal: Negative for abdominal pain.  Musculoskeletal: Negative for myalgias.  Skin: Negative for rash.  Neurological: Negative for dizziness and syncope.    2/10   improved right eye. Objective:   Physical Exam  Constitutional: He is oriented to person, place, and time. He appears well-developed and well-nourished.  HENT:  Head: Normocephalic and atraumatic.  Right Ear: External ear normal. A middle ear effusion is present.  Left Ear: External ear normal. A middle ear effusion is present.  Eyes: Pupils are equal, round, and reactive to light. Conjunctivae, EOM and lids are normal.  Musculoskeletal: Normal range of motion.  Neurological: He is alert and oriented to person, place, and time.  Skin: Skin is warm and dry.  Psychiatric: He has a normal mood and affect. His behavior is normal. Judgment and thought content normal.  Nursing note and vitals reviewed.        hr recheck  98-102 he rode his bike , and worked out at [employer] gym this morning.  Assessment & Plan:  Otitis externa left ear, cerumen in ears bilateral, Eustachian tube dysfuction  Bilateral. Continue Ciprodex in right ear, use Debrox in right ear x 4 days and rinse in shower to remove the remainder of wax. Finish oral antibiotics. Return to the clinic in 7-10 days unless any concerns then return sooner Patient did  not pick up eye drops but eye is now better.

## 2017-02-14 DIAGNOSIS — G4733 Obstructive sleep apnea (adult) (pediatric): Secondary | ICD-10-CM | POA: Diagnosis not present

## 2017-02-22 ENCOUNTER — Encounter: Payer: Self-pay | Admitting: Medical

## 2017-02-22 ENCOUNTER — Ambulatory Visit: Payer: Self-pay | Admitting: Medical

## 2017-02-22 VITALS — BP 118/82 | HR 73 | Temp 98.4°F | Wt 259.0 lb

## 2017-02-22 DIAGNOSIS — Z789 Other specified health status: Secondary | ICD-10-CM

## 2017-02-22 DIAGNOSIS — H6983 Other specified disorders of Eustachian tube, bilateral: Secondary | ICD-10-CM

## 2017-02-22 NOTE — Patient Instructions (Signed)
Continue the Claritin and add in OTC Sudafed take daytime dose only , Take for 7 days.      Eustachian Tube Dysfunction The eustachian tube connects the middle ear to the back of the nose. It regulates air pressure in the middle ear by allowing air to move between the ear and nose. It also helps to drain fluid from the middle ear space. When the eustachian tube does not function properly, air pressure, fluid, or both can build up in the middle ear. Eustachian tube dysfunction can affect one or both ears. What are the causes? This condition happens when the eustachian tube becomes blocked or cannot open normally. This may result from:  Ear infections.  Colds and other upper respiratory infections.  Allergies.  Irritation, such as from cigarette smoke or acid from the stomach coming up into the esophagus (gastroesophageal reflux).  Sudden changes in air pressure, such as from descending in an airplane.  Abnormal growths in the nose or throat, such as nasal polyps, tumors, or enlarged tissue at the back of the throat (adenoids).  What increases the risk? This condition may be more likely to develop in people who smoke and people who are overweight. Eustachian tube dysfunction may also be more likely to develop in children, especially children who have:  Certain birth defects of the mouth, such as cleft palate.  Large tonsils and adenoids.  What are the signs or symptoms? Symptoms of this condition may include:  A feeling of fullness in the ear.  Ear pain.  Clicking or popping noises in the ear.  Ringing in the ear.  Hearing loss.  Loss of balance.  Symptoms may get worse when the air pressure around you changes, such as when you travel to an area of high elevation or fly on an airplane. How is this diagnosed? This condition may be diagnosed based on:  Your symptoms.  A physical exam of your ear, nose, and throat.  Tests, such as those that measure: ? The movement  of your eardrum (tympanogram). ? Your hearing (audiometry).  How is this treated? Treatment depends on the cause and severity of your condition. If your symptoms are mild, you may be able to relieve your symptoms by moving air into ("popping") your ears. If you have symptoms of fluid in your ears, treatment may include:  Decongestants.  Antihistamines.  Nasal sprays or ear drops that contain medicines that reduce swelling (steroids).  In some cases, you may need to have a procedure to drain the fluid in your eardrum (myringotomy). In this procedure, a small tube is placed in the eardrum to:  Drain the fluid.  Restore the air in the middle ear space.  Follow these instructions at home:  Take over-the-counter and prescription medicines only as told by your health care provider.  Use techniques to help pop your ears as recommended by your health care provider. These may include: ? Chewing gum. ? Yawning. ? Frequent, forceful swallowing. ? Closing your mouth, holding your nose closed, and gently blowing as if you are trying to blow air out of your nose.  Do not do any of the following until your health care provider approves: ? Travel to high altitudes. ? Fly in airplanes. ? Work in a Estate agentpressurized cabin or room. ? Scuba dive.  Keep your ears dry. Dry your ears completely after showering or bathing.  Do not smoke.  Keep all follow-up visits as told by your health care provider. This is important. Contact a health  care provider if:  Your symptoms do not go away after treatment.  Your symptoms come back after treatment.  You are unable to pop your ears.  You have: ? A fever. ? Pain in your ear. ? Pain in your head or neck. ? Fluid draining from your ear.  Your hearing suddenly changes.  You become very dizzy.  You lose your balance. This information is not intended to replace advice given to you by your health care provider. Make sure you discuss any questions you  have with your health care provider. Document Released: 08/21/2015 Document Revised: 12/31/2015 Document Reviewed: 08/13/2014 Elsevier Interactive Patient Education  2018 [employer]. Earwax Buildup, Adult The ears produce a substance called earwax that helps keep bacteria out of the ear and protects the skin in the ear canal. Occasionally, earwax can build up in the ear and cause discomfort or hearing loss. What increases the risk? This condition is more likely to develop in people who:  Are male.  Are elderly.  Naturally produce more earwax.  Clean their ears often with cotton swabs.  Use earplugs often.  Use in-ear headphones often.  Wear hearing aids.  Have narrow ear canals.  Have earwax that is overly thick or sticky.  Have eczema.  Are dehydrated.  Have excess hair in the ear canal.  What are the signs or symptoms? Symptoms of this condition include:  Reduced or muffled hearing.  A feeling of fullness in the ear or feeling that the ear is plugged.  Fluid coming from the ear.  Ear pain.  Ear itch.  Ringing in the ear.  Coughing.  An obvious piece of earwax that can be seen inside the ear canal.  How is this diagnosed? This condition may be diagnosed based on:  Your symptoms.  Your medical history.  An ear exam. During the exam, your health care provider will look into your ear with an instrument called an otoscope.  You may have tests, including a hearing test. How is this treated? This condition may be treated by:  Using ear drops to soften the earwax.  Having the earwax removed by a health care provider. The health care provider may: ? Flush the ear with water. ? Use an instrument that has a loop on the end (curette). ? Use a suction device.  Surgery to remove the wax buildup. This may be done in severe cases.  Follow these instructions at home:  Take over-the-counter and prescription medicines only as told by your health care  provider.  Do not put any objects, including cotton swabs, into your ear. You can clean the opening of your ear canal with a washcloth or facial tissue.  Follow instructions from your health care provider about cleaning your ears. Do not over-clean your ears.  Drink enough fluid to keep your urine clear or pale yellow. This will help to thin the earwax.  Keep all follow-up visits as told by your health care provider. If earwax builds up in your ears often or if you use hearing aids, consider seeing your health care provider for routine, preventive ear cleanings. Ask your health care provider how often you should schedule your cleanings.  If you have hearing aids, clean them according to instructions from the manufacturer and your health care provider. Contact a health care provider if:  You have ear pain.  You develop a fever.  You have blood, pus, or other fluid coming from your ear.  You have hearing loss.  You have  ringing in your ears that does not go away.  Your symptoms do not improve with treatment.  You feel like the room is spinning (vertigo). Summary  Earwax can build up in the ear and cause discomfort or hearing loss.  The most common symptoms of this condition include reduced or muffled hearing and a feeling of fullness in the ear or feeling that the ear is plugged.  This condition may be diagnosed based on your symptoms, your medical history, and an ear exam.  This condition may be treated by using ear drops to soften the earwax or by having the earwax removed by a health care provider.  Do not put any objects, including cotton swabs, into your ear. You can clean the opening of your ear canal with a washcloth or facial tissue. This information is not intended to replace advice given to you by your health care provider. Make sure you discuss any questions you have with your health care provider. Document Released: 09/01/2004 Document Revised: 10/05/2016 Document  Reviewed: 10/05/2016 Elsevier Interactive Patient Education  [employer].

## 2017-02-22 NOTE — Progress Notes (Signed)
   Subjective:    Patient ID: William Stuart, male    DOB: 23-Feb-1981, 36 y.o.   MRN: [id number]  HPI 36 yo  Returns for follow up of otitis externa and cerumen impaction.  Feeling better, right ear feels fullness, sometimes makes noise like there is a lot of fluid in the ear.  Taking Claritin still. Needs nothing for pain. Finished antibiotics both oral and ear drops.  Having brown discharge from left ear.  Review of Systems  Constitutional: Negative for chills and fever.  HENT: Positive for ear discharge. Negative for ear pain, hearing loss, rhinorrhea and sore throat.   Respiratory: Negative for cough.        Objective:   Physical Exam  Constitutional: He appears well-developed and well-nourished.  HENT:  Head: Normocephalic and atraumatic.  Right Ear: Hearing, external ear and ear canal normal. A middle ear effusion is present.  Left Ear: Hearing, external ear and ear canal normal. A middle ear effusion is present.  Eyes: Pupils are equal, round, and reactive to light. Conjunctivae and EOM are normal.  Neck: Normal range of motion.  Nursing note and vitals reviewed.  Wax in  base of canal of left ear       Assessment & Plan:  Ear wax in left ear removed what I could with currette.  Still some in canal. Recommended Debrox otic drops every  2 weeks or  Once a month to keep up on wax removal. Eustachian tube dysfunction bilaterally. Continue OTC Claritin 10 mg take as directed, add in Sudafed 12 hour take only daytime dose. For one to two weeks, take as directed. Offered steroid dose pack but he did not want them at this time. Return to the clinic as needed.

## 2017-03-17 DIAGNOSIS — G4733 Obstructive sleep apnea (adult) (pediatric): Secondary | ICD-10-CM | POA: Diagnosis not present

## 2017-04-17 DIAGNOSIS — G4733 Obstructive sleep apnea (adult) (pediatric): Secondary | ICD-10-CM | POA: Diagnosis not present

## 2017-05-17 DIAGNOSIS — G4733 Obstructive sleep apnea (adult) (pediatric): Secondary | ICD-10-CM | POA: Diagnosis not present

## 2017-05-31 DIAGNOSIS — G479 Sleep disorder, unspecified: Secondary | ICD-10-CM | POA: Diagnosis not present

## 2017-05-31 DIAGNOSIS — R51 Headache: Secondary | ICD-10-CM | POA: Diagnosis not present

## 2017-07-24 DIAGNOSIS — G4733 Obstructive sleep apnea (adult) (pediatric): Secondary | ICD-10-CM | POA: Insufficient documentation

## 2017-07-24 DIAGNOSIS — G44229 Chronic tension-type headache, not intractable: Secondary | ICD-10-CM | POA: Diagnosis not present

## 2017-08-11 ENCOUNTER — Ambulatory Visit: Payer: Self-pay | Admitting: Adult Health

## 2017-08-11 ENCOUNTER — Encounter: Payer: Self-pay | Admitting: Adult Health

## 2017-08-11 VITALS — BP 120/76 | HR 96 | Temp 97.0°F | Resp 16 | Ht 74.0 in | Wt 245.0 lb

## 2017-08-11 DIAGNOSIS — J0111 Acute recurrent frontal sinusitis: Secondary | ICD-10-CM

## 2017-08-11 MED ORDER — AMOXICILLIN-POT CLAVULANATE 875-125 MG PO TABS: 1.0000 | ORAL_TABLET | Freq: Two times a day (BID) | ORAL | 0 refills | Status: DC

## 2017-08-11 MED ORDER — ACETAMINOPHEN 500 MG PO TABS: 1000.0000 mg | ORAL_TABLET | Freq: Four times a day (QID) | ORAL | 0 refills | Status: AC | PRN

## 2017-08-11 MED ORDER — FLUTICASONE PROPIONATE 50 MCG/ACT NA SUSP: 1.0000 | Freq: Two times a day (BID) | NASAL | 0 refills | Status: DC

## 2017-08-11 MED ORDER — LORATADINE 10 MG PO TABS: 10.0000 mg | ORAL_TABLET | Freq: Every day | ORAL | 11 refills | Status: DC

## 2017-08-11 NOTE — Progress Notes (Signed)
Subjective:    Patient ID: William Stuart, male    DOB: 03/25/1981, 37 y.o.   MRN: [id number]  37y/o caucasian male established patient here for right sided frontal and maxillary sinus pain pressure and upper teeth pain, congestion nasal, post nasal drip and nonproductive cough especially at night.  Sister sick when visiting family and his children sick with cough/cold/runny nose.  Not taking his allergy medications at this time but flonase, saline and claritin usually help.  Does not want prescription cough medicine.  Used augmentin last sinus infection worked well in 2018.      Review of Systems  Constitutional: Negative for activity change, appetite change, chills, diaphoresis, fatigue, fever and unexpected weight change.  HENT: Positive for congestion, postnasal drip, rhinorrhea, sinus pressure, sinus pain and sore throat. Negative for dental problem, drooling, ear discharge, ear pain, facial swelling, hearing loss, mouth sores, nosebleeds, sneezing, tinnitus, trouble swallowing and voice change.   Eyes: Negative for photophobia, pain, discharge, redness, itching and visual disturbance.  Respiratory: Positive for cough. Negative for choking, chest tightness, shortness of breath, wheezing and stridor.   Cardiovascular: Negative for chest pain, palpitations and leg swelling.  Gastrointestinal: Negative for abdominal distention, abdominal pain, blood in stool, constipation, diarrhea, nausea and vomiting.  Endocrine: Negative for cold intolerance and heat intolerance.  Genitourinary: Negative for dysuria.  Musculoskeletal: Negative for arthralgias, back pain, gait problem, joint swelling, myalgias, neck pain and neck stiffness.  Skin: Negative for color change, pallor, rash and wound.  Allergic/Immunologic: Positive for environmental allergies. Negative for food allergies and immunocompromised state.  Neurological: Negative for dizziness, tremors, seizures, syncope, facial asymmetry, speech  difficulty, weakness, light-headedness, numbness and headaches.  Hematological: Negative for adenopathy. Does not bruise/bleed easily.  Psychiatric/Behavioral: Positive for sleep disturbance. Negative for agitation, behavioral problems and confusion.       Objective:   Physical Exam  Constitutional: He is oriented to person, place, and time. Vital signs are normal. He appears well-developed and well-nourished. He is active and cooperative.  Non-toxic appearance. He does not have a sickly appearance. He appears ill. No distress.  HENT:  Head: Normocephalic and atraumatic.  Right Ear: Hearing, external ear and ear canal normal. A middle ear effusion is present.  Left Ear: Hearing, external ear and ear canal normal. A middle ear effusion is present.  Nose: Mucosal edema and rhinorrhea present. No nose lacerations, sinus tenderness, nasal deformity, septal deviation or nasal septal hematoma. No epistaxis.  No foreign bodies. Right sinus exhibits maxillary sinus tenderness and frontal sinus tenderness. Left sinus exhibits no maxillary sinus tenderness and no frontal sinus tenderness.  Mouth/Throat: Uvula is midline and mucous membranes are normal. Mucous membranes are not pale, not dry and not cyanotic. He does not have dentures. No oral lesions. No trismus in the jaw. Normal dentition. No dental abscesses, uvula swelling, lacerations or dental caries. Posterior oropharyngeal edema and posterior oropharyngeal erythema present. No oropharyngeal exudate or tonsillar abscesses.  Cobblestoning posterior pharynx; bilateral TMs air fluid level clear; copious cerumen lower 50% auditory canal pale yellow sticky bilaterally; bilateral nasal turbinates edema/erythema/clear discharge; bilateral allergic shiners;   Eyes: Conjunctivae, EOM and lids are normal. Pupils are equal, round, and reactive to light. Right eye exhibits no chemosis, no discharge, no exudate and no hordeolum. No foreign body present in the right  eye. Left eye exhibits no chemosis, no discharge, no exudate and no hordeolum. No foreign body present in the left eye. Right conjunctiva is not injected. Right conjunctiva has  no hemorrhage. Left conjunctiva is not injected. Left conjunctiva has no hemorrhage. No scleral icterus. Right eye exhibits normal extraocular motion and no nystagmus. Left eye exhibits normal extraocular motion and no nystagmus. Right pupil is round and reactive. Left pupil is round and reactive. Pupils are equal.  Neck: Trachea normal, normal range of motion and phonation normal. Neck supple. No tracheal tenderness and no muscular tenderness present. No neck rigidity. No tracheal deviation, no edema, no erythema and normal range of motion present. No thyroid mass and no thyromegaly present.  Cardiovascular: Normal rate, regular rhythm, S1 normal, S2 normal, normal heart sounds and intact distal pulses. PMI is not displaced. Exam reveals no gallop and no friction rub.  No murmur heard. Pulmonary/Chest: Effort normal and breath sounds normal. No accessory muscle usage or stridor. No respiratory distress. He has no decreased breath sounds. He has no wheezes. He has no rhonchi. He has no rales.  Spoke full sentences without difficulty; no cough observed in exam room  Abdominal: Soft. Normal appearance. He exhibits no distension, no fluid wave and no ascites. There is no rigidity and no guarding.  Musculoskeletal: Normal range of motion. He exhibits no edema or tenderness.       Right shoulder: Normal.       Left shoulder: Normal.       Right elbow: Normal.      Left elbow: Normal.       Right hip: Normal.       Left hip: Normal.       Right knee: Normal.       Left knee: Normal.       Cervical back: Normal.       Thoracic back: Normal.       Lumbar back: Normal.       Right hand: Normal.       Left hand: Normal.  Lymphadenopathy:       Head (right side): No submental, no submandibular, no tonsillar, no preauricular, no  posterior auricular and no occipital adenopathy present.       Head (left side): No submental, no submandibular, no tonsillar, no preauricular, no posterior auricular and no occipital adenopathy present.    He has no cervical adenopathy.       Right cervical: No superficial cervical, no deep cervical and no posterior cervical adenopathy present.      Left cervical: No superficial cervical, no deep cervical and no posterior cervical adenopathy present.  Neurological: He is alert and oriented to person, place, and time. He has normal strength. He is not disoriented. He displays no atrophy and no tremor. No cranial nerve deficit or sensory deficit. He exhibits normal muscle tone. He displays no seizure activity. Coordination and gait normal. GCS eye subscore is 4. GCS verbal subscore is 5. GCS motor subscore is 6.  On/off exam table without difficulty; gait sure and steady in hallway  Skin: Skin is warm, dry and intact. No abrasion, no bruising, no burn, no ecchymosis, no laceration, no lesion, no petechiae and no rash noted. He is not diaphoretic. No cyanosis or erythema. No pallor. Nails show no clubbing.  Psychiatric: He has a normal mood and affect. His speech is normal and behavior is normal. Judgment and thought content normal. Cognition and memory are normal.  Nursing note and vitals reviewed.         Assessment & Plan:  A-acute recurrent frontal sinusitis  P-motrin 800mg  po TID prn pain or tylenol 1000mg  po QID prn pain/fever.  Restart flonase 1 spray each nostril BID #1 RF0 electronic Rx, saline 2 sprays each nostril q2h wa prn congestion. claritin 10mg  po daily OTC.  If no improvement with 48 hours of saline and flonase use start augmentin 875mg  po BID x 10 days #20 RF0.  Electronic Rx given.  Denied personal or family history of ENT cancer.  Shower BID especially prior to bed. No evidence of systemic bacterial infection, non toxic and well hydrated.  I do not see where any further testing  or imaging is necessary at this time.   I will suggest supportive care, rest, good hygiene and encourage the patient to take adequate fluids.  Honey with lemon or OTC cough lozenges po q2h prn cough. The patient is to return to clinic or EMERGENCY ROOM if symptoms worsen or change significantly.  Exitcare handout on sinusitis and sinus rinse given to patient.  Patient verbalized agreement and understanding of treatment plan and had no further questions at this time.   P2:  Hand washing and cover cough

## 2017-08-11 NOTE — Patient Instructions (Signed)
Sinusitis, Adult  Sinusitis is soreness and inflammation of your sinuses. Sinuses are hollow spaces in the bones around your face. Your sinuses are located:  · Around your eyes.  · In the middle of your forehead.  · Behind your nose.  · In your cheekbones.    Your sinuses and nasal passages are lined with a stringy fluid (mucus). Mucus normally drains out of your sinuses. When your nasal tissues become inflamed or swollen, the mucus can become trapped or blocked so air cannot flow through your sinuses. This allows bacteria, viruses, and funguses to grow, which leads to infection.  Sinusitis can develop quickly and last for 7?10 days (acute) or for more than 12 weeks (chronic). Sinusitis often develops after a cold.  What are the causes?  This condition is caused by anything that creates swelling in the sinuses or stops mucus from draining, including:  · Allergies.  · Asthma.  · Bacterial or viral infection.  · Abnormally shaped bones between the nasal passages.  · Nasal growths that contain mucus (nasal polyps).  · Narrow sinus openings.  · Pollutants, such as chemicals or irritants in the air.  · A foreign object stuck in the nose.  · A fungal infection. This is rare.    What increases the risk?  The following factors may make you more likely to develop this condition:  · Having allergies or asthma.  · Having had a recent cold or respiratory tract infection.  · Having structural deformities or blockages in your nose or sinuses.  · Having a weak immune system.  · Doing a lot of swimming or diving.  · Overusing nasal sprays.  · Smoking.    What are the signs or symptoms?  The main symptoms of this condition are pain and a feeling of pressure around the affected sinuses. Other symptoms include:  · Upper toothache.  · Earache.  · Headache.  · Bad breath.  · Decreased sense of smell and taste.  · A cough that may get worse at night.  · Fatigue.  · Fever.   · Thick drainage from your nose. The drainage is often green and it may contain pus (purulent).  · Stuffy nose or congestion.  · Postnasal drip. This is when extra mucus collects in the throat or back of the nose.  · Swelling and warmth over the affected sinuses.  · Sore throat.  · Sensitivity to light.    How is this diagnosed?  This condition is diagnosed based on symptoms, a medical history, and a physical exam. To find out if your condition is acute or chronic, your health care provider may:  · Look in your nose for signs of nasal polyps.  · Tap over the affected sinus to check for signs of infection.  · View the inside of your sinuses using an imaging device that has a light attached (endoscope).    If your health care provider suspects that you have chronic sinusitis, you may also:  · Be tested for allergies.  · Have a sample of mucus taken from your nose (nasal culture) and checked for bacteria.  · Have a mucus sample examined to see if your sinusitis is related to an allergy.    If your sinusitis does not respond to treatment and it lasts longer than 8 weeks, you may have an MRI or CT scan to check your sinuses. These scans also help to determine how severe your infection is.  In rare cases, a bone   biopsy may be done to rule out more serious types of fungal sinus disease.  How is this treated?  Treatment for sinusitis depends on the cause and whether your condition is chronic or acute. If a virus is causing your sinusitis, your symptoms will go away on their own within 10 days. You may be given medicines to relieve your symptoms, including:  · Topical nasal decongestants. They shrink swollen nasal passages and let mucus drain from your sinuses.  · Antihistamines. These drugs block inflammation that is triggered by allergies. This can help to ease swelling in your nose and sinuses.  · Topical nasal corticosteroids. These are nasal sprays that ease inflammation and swelling in your nose and sinuses.   · Nasal saline washes. These rinses can help to get rid of thick mucus in your nose.    If your condition is caused by bacteria, you will be given an antibiotic medicine. If your condition is caused by a fungus, you will be given an antifungal medicine.  Surgery may be needed to correct underlying conditions, such as narrow nasal passages. Surgery may also be needed to remove polyps.  Follow these instructions at home:  Medicines  · Take, use, or apply over-the-counter and prescription medicines only as told by your health care provider. These may include nasal sprays.  · If you were prescribed an antibiotic medicine, take it as told by your health care provider. Do not stop taking the antibiotic even if you start to feel better.  Hydrate and Humidify  · Drink enough water to keep your urine clear or pale yellow. Staying hydrated will help to thin your mucus.  · Use a cool mist humidifier to keep the humidity level in your home above 50%.  · Inhale steam for 10–15 minutes, 3–4 times a day or as told by your health care provider. You can do this in the bathroom while a hot shower is running.  · Limit your exposure to cool or dry air.  Rest  · Rest as much as possible.  · Sleep with your head raised (elevated).  · Make sure to get enough sleep each night.  General instructions  · Apply a warm, moist washcloth to your face 3–4 times a day or as told by your health care provider. This will help with discomfort.  · Wash your hands often with soap and water to reduce your exposure to viruses and other germs. If soap and water are not available, use hand sanitizer.  · Do not smoke. Avoid being around people who are smoking (secondhand smoke).  · Keep all follow-up visits as told by your health care provider. This is important.  Contact a health care provider if:  · You have a fever.  · Your symptoms get worse.  · Your symptoms do not improve within 10 days.  Get help right away if:  · You have a severe headache.   · You have persistent vomiting.  · You have pain or swelling around your face or eyes.  · You have vision problems.  · You develop confusion.  · Your neck is stiff.  · You have trouble breathing.  This information is not intended to replace advice given to you by your health care provider. Make sure you discuss any questions you have with your health care provider.  Document Released: 07/25/2005 Document Revised: 03/20/2016 Document Reviewed: 05/20/2015  Elsevier Interactive Patient Education © 2018 [employer]      Sinus Rinse  What is   a sinus rinse?  A sinus rinse is a simple home treatment that is used to rinse your sinuses with a sterile mixture of salt and water (saline solution). Sinuses are air-filled spaces in your skull behind the bones of your face and forehead that open into your nasal cavity.  You will use the following:  · Saline solution.  · Neti pot or spray bottle. This releases the saline solution into your nose and through your sinuses. Neti pots and spray bottles can be purchased at your local pharmacy, a health food store, or online.    When would I do a sinus rinse?  A sinus rinse can help to clear mucus, dirt, dust, or pollen from the nasal cavity. You may do a sinus rinse when you have a cold, a virus, nasal allergy symptoms, a sinus infection, or stuffiness in the nose or sinuses.  If you are considering a sinus rinse:  · Ask your child's health care provider before performing a sinus rinse on your child.  · Do not do a sinus rinse if you have had ear or nasal surgery, ear infection, or blocked ears.    How do I do a sinus rinse?  · Wash your hands.  · Disinfect your device according to the directions provided and then dry it.  · Use the solution that comes with your device or one that is sold separately in stores. Follow the mixing directions on the package.  · Fill your device with the amount of saline solution as directed by the device instructions.   · Stand over a sink and tilt your head sideways over the sink.  · Place the spout of the device in your upper nostril (the one closer to the ceiling).  · Gently pour or squeeze the saline solution into the nasal cavity. The liquid should drain to the lower nostril if you are not overly congested.  · Gently blow your nose. Blowing too hard may cause ear pain.  · Repeat in the other nostril.  · Clean and rinse your device with clean water and then air-dry it.  Are there risks of a sinus rinse?  Sinus rinse is generally very safe and effective. However, there are a few risks, which include:  · A burning sensation in the sinuses. This may happen if you do not make the saline solution as directed. Make sure to follow all directions when making the saline solution.  · Infection from contaminated water. This is rare, but possible.  · Nasal irritation.    This information is not intended to replace advice given to you by your health care provider. Make sure you discuss any questions you have with your health care provider.  Document Released: 02/19/2014 Document Revised: 06/21/2016 Document Reviewed: 12/10/2013  Elsevier Interactive Patient Education © 2017 [employer] Inc[employer]

## 2017-08-25 ENCOUNTER — Encounter: Payer: Self-pay | Admitting: Internal Medicine

## 2017-08-25 ENCOUNTER — Ambulatory Visit: Payer: BLUE CROSS/BLUE SHIELD | Admitting: Internal Medicine

## 2017-08-25 VITALS — BP 122/80 | HR 75 | Temp 98.0°F | Resp 18 | Wt 249.0 lb

## 2017-08-25 DIAGNOSIS — R059 Cough, unspecified: Secondary | ICD-10-CM | POA: Insufficient documentation

## 2017-08-25 DIAGNOSIS — R05 Cough: Secondary | ICD-10-CM

## 2017-08-25 MED ORDER — PREDNISONE 20 MG PO TABS: 40.0000 mg | ORAL_TABLET | Freq: Every day | ORAL | 0 refills | Status: DC

## 2017-08-25 NOTE — Progress Notes (Signed)
   Subjective:    Patient ID: William NievesColin M Garzon, male    DOB: May 22, 1981, 37 y.o.   MRN: [id number]  HPI Here due to ongoing cough Started not feeling well just after Christmas Seemed to improve some--but then not resolving Saw PA at [employer]--got augmentin The cough worsened after this---- really bad for a week, keeping him up  Doesn't feel sick like he did Still some congestion Some lightheaded feelings with severe cough Sleep is better but worse at night  Not really SOB Has been able to do work out at gym---does notice some wheezing No trouble with asthma in last few years--doesn't have inhaler (many years ago he did)  Current Outpatient Medications on File Prior to Visit  Medication Sig Dispense Refill  . amoxicillin-clavulanate (AUGMENTIN) 875-125 MG tablet Take 1 tablet by mouth 2 (two) times daily. 20 tablet 0  . nortriptyline (PAMELOR) 10 MG capsule Take 2-4 capsules by mouth at bedtime as needed.    . SUMAtriptan (IMITREX) 100 MG tablet Take 1 tablet by mouth daily as needed. May repeat once in 2 hours max 2 doses per 24 hours     No current facility-administered medications on file prior to visit.     No Known Allergies  Past Medical History:  Diagnosis Date  . Allergy   . Anxiety   . Asthma   . GERD (gastroesophageal reflux disease)     Past Surgical History:  Procedure Laterality Date  . ORCHIECTOMY     L orchiectomy and R orchiopexy for torsion   2002  . PILONIDAL CYST EXCISION  2000 & 2001   3 times    Family History  Problem Relation Age of Onset  . Migraines Mother   . Migraines Father   . Cancer Neg Hx     Social History   Socioeconomic History  . Marital status: Married    Spouse name: Not on file  . Number of children: 2  . Years of education: Not on file  . Highest education level: Not on file  Social Needs  . Financial resource strain: Not on file  . Food insecurity - worry: Not on file  . Food insecurity - inability: Not on file  .  Transportation needs - medical: Not on file  . Transportation needs - non-medical: Not on file  Occupational History  . 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 Smoking status: Never Smoker  . Smokeless tobacco: Never Used  Substance and Sexual Activity  . Alcohol use: Yes    Comment: occasional  . Drug use: No  . Sexual activity: Not on file  Other Topics Concern  . Not on file  Social History Narrative  . Not on file   Review of Systems Some increase in heart rate at times No fever    Objective:   Physical Exam  Constitutional: No distress.  occ coarse cough  HENT:  No sinus tenderness TMs normal Very slight pharyngeal injection Mild nasal inflammation  Neck: No thyromegaly present.  Pulmonary/Chest: Effort normal. No respiratory distress. He has no wheezes. He has no rales.  ?very slight prolongation of expiratory phase  Lymphadenopathy:    He has no cervical adenopathy.          Assessment & Plan:

## 2017-08-25 NOTE — Patient Instructions (Signed)
Let me know on Monday if you are not better, I will send a prescription for an inhaler to try.

## 2017-08-25 NOTE — Assessment & Plan Note (Signed)
Likely post infectious but may have element of bronchospasm Son had RSV--that would increase his chance of pulmonary persistence Will give 3 days of prednisone Finish out augmentin properly (for diagnosed sinusitis) Would send inhaler next week if persist

## 2017-10-10 DIAGNOSIS — G44229 Chronic tension-type headache, not intractable: Secondary | ICD-10-CM | POA: Diagnosis not present

## 2017-10-10 DIAGNOSIS — R51 Headache: Secondary | ICD-10-CM | POA: Diagnosis not present

## 2017-10-10 DIAGNOSIS — G479 Sleep disorder, unspecified: Secondary | ICD-10-CM | POA: Diagnosis not present

## 2017-10-10 DIAGNOSIS — G4733 Obstructive sleep apnea (adult) (pediatric): Secondary | ICD-10-CM | POA: Diagnosis not present

## 2017-11-21 DIAGNOSIS — G44229 Chronic tension-type headache, not intractable: Secondary | ICD-10-CM | POA: Diagnosis not present

## 2017-11-21 DIAGNOSIS — G4733 Obstructive sleep apnea (adult) (pediatric): Secondary | ICD-10-CM | POA: Diagnosis not present

## 2017-11-21 DIAGNOSIS — G479 Sleep disorder, unspecified: Secondary | ICD-10-CM | POA: Diagnosis not present

## 2018-02-14 ENCOUNTER — Ambulatory Visit (INDEPENDENT_AMBULATORY_CARE_PROVIDER_SITE_OTHER)
Admission: RE | Admit: 2018-02-14 | Discharge: 2018-02-14 | Disposition: A | Discharge status: Home / Self Care | Payer: BLUE CROSS/BLUE SHIELD | Source: Ambulatory Visit | Attending: Internal Medicine | Admitting: Internal Medicine

## 2018-02-14 ENCOUNTER — Ambulatory Visit: Payer: BLUE CROSS/BLUE SHIELD | Admitting: Internal Medicine

## 2018-02-14 ENCOUNTER — Encounter: Payer: Self-pay | Admitting: Internal Medicine

## 2018-02-14 VITALS — BP 118/80 | HR 80 | Temp 98.0°F | Resp 18 | Ht 74.0 in | Wt 263.0 lb

## 2018-02-14 DIAGNOSIS — R059 Cough, unspecified: Secondary | ICD-10-CM

## 2018-02-14 DIAGNOSIS — K219 Gastro-esophageal reflux disease without esophagitis: Secondary | ICD-10-CM | POA: Diagnosis not present

## 2018-02-14 DIAGNOSIS — R05 Cough: Secondary | ICD-10-CM

## 2018-02-14 MED ORDER — OMEPRAZOLE 20 MG PO CPDR: 20.0000 mg | DELAYED_RELEASE_CAPSULE | Freq: Every day | ORAL | 3 refills | Status: DC

## 2018-02-14 MED ORDER — ALBUTEROL SULFATE HFA 108 (90 BASE) MCG/ACT IN AERS: 2.0000 | INHALATION_SPRAY | Freq: Four times a day (QID) | RESPIRATORY_TRACT | 1 refills | Status: DC | PRN

## 2018-02-14 MED ORDER — MONTELUKAST SODIUM 10 MG PO TABS: 10.0000 mg | ORAL_TABLET | Freq: Every day | ORAL | 3 refills | Status: DC

## 2018-02-14 NOTE — Assessment & Plan Note (Signed)
Will start PPI. 

## 2018-02-14 NOTE — Assessment & Plan Note (Addendum)
Unclear if cough is cause of reflux--or the other way around Could be cough variant asthma--- with reflux as separate thing No signs of infection--but will check CXR and spirometry  CXR shows some hyperinflation but no infiltrate Spirometry shows borderline obstructive pattern--but didn't do post bronchodilator  Will start omeprazole Trial with montelukast Albuterol inhaler prn

## 2018-02-14 NOTE — Progress Notes (Signed)
Subjective:    Patient ID: William NievesColin M Bazzle, male    DOB: [birth year], 37 y.o.   MRN: [id number]  HPI Here due to cough Started about 4 weeks ago This is accompanied by persistent GERD symptoms  Cough worse when lying down No fever--- some head stuffiness and left ear pressure (just the past few days) Some post nasal drip sensation Some water brash  Still able to go to gym--but seems more SOB with exertion Cough is dry Slight wheeze a few weeks ago---when in the gym  Hasn't used any meds for this Past asthma--no issues in 20 years or so  Current Outpatient Medications on File Prior to Visit  Medication Sig Dispense Refill  . nortriptyline (PAMELOR) 10 MG capsule Take 2-4 capsules by mouth at bedtime as needed.    . SUMAtriptan (IMITREX) 100 MG tablet Take 1 tablet by mouth daily as needed. May repeat once in 2 hours max 2 doses per 24 hours     No current facility-administered medications on file prior to visit.     No Known Allergies  Past Medical History:  Diagnosis Date  . Allergy   . Anxiety   . Asthma   . GERD (gastroesophageal reflux disease)     Past Surgical History:  Procedure Laterality Date  . ORCHIECTOMY     L orchiectomy and R orchiopexy for torsion   2002  . PILONIDAL CYST EXCISION  2000 & 2001   3 times    Family History  Problem Relation Age of Onset  . Migraines Mother   . Migraines Father   . Cancer Neg Hx     Social History   Socioeconomic History  . Marital status: Married    Spouse name: Not on file  . Number of children: 2  . Years of education: Not on file  . Highest education level: Not on file  Occupational History  . 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 Financial resource strain: Not on file  . Food insecurity:    Worry: Not on file    Inability: Not on file  . Transportation needs:    Medical: Not on file    Non-medical: Not on file  Tobacco Use    . Smoking status: Never Smoker  . Smokeless tobacco: Never Used  Substance and Sexual Activity  . Alcohol use: Yes    Comment: occasional  . Drug use: No  . Sexual activity: Not on file  Lifestyle  . Physical activity:    Days per week: Not on file    Minutes per session: Not on file  . Stress: Not on file  Relationships  . Social connections:    Talks on phone: Not on file    Gets together: Not on file    Attends religious service: Not on file    Active member of club or organization: Not on file    Attends meetings of clubs or organizations: Not on file    Relationship status: Not on file  . Intimate partner violence:    Fear of current or ex partner: Not on file    Emotionally abused: Not on file    Physically abused: Not on file    Forced sexual activity: Not on file  Other Topics Concern  . Not on file  Social History Narrative  . Not on file   Review of Systems Some scratchiness to voice--maybe some weaker No  N/V Appetite is okay No weight loss No exercise    Objective:   Physical Exam  Constitutional: He appears well-developed. No distress.  HENT:  Mouth/Throat: Oropharynx is clear and moist. No oropharyngeal exudate.  No sinus tenderness TMs normal Mild nasal congestion  Neck: No thyromegaly present.  Respiratory: Effort normal and breath sounds normal. No respiratory distress. He has no rales.  GI: Soft. He exhibits no distension. There is no tenderness. There is no rebound and no guarding.  Lymphadenopathy:    He has no cervical adenopathy.           Assessment & Plan:

## 2018-03-16 ENCOUNTER — Ambulatory Visit: Payer: BLUE CROSS/BLUE SHIELD | Admitting: Internal Medicine

## 2018-03-16 ENCOUNTER — Encounter: Payer: Self-pay | Admitting: Internal Medicine

## 2018-03-16 VITALS — BP 118/76 | HR 83 | Temp 97.9°F | Wt 267.0 lb

## 2018-03-16 DIAGNOSIS — J4520 Mild intermittent asthma, uncomplicated: Secondary | ICD-10-CM | POA: Diagnosis not present

## 2018-03-16 DIAGNOSIS — K219 Gastro-esophageal reflux disease without esophagitis: Secondary | ICD-10-CM

## 2018-03-16 NOTE — Assessment & Plan Note (Signed)
Better now Discussed continuing singulair (consider hold out of allergy season) Albuterol before exercise and prn

## 2018-03-16 NOTE — Assessment & Plan Note (Signed)
Clear symptom relief on PPI Will see if he can cut to every other day

## 2018-03-16 NOTE — Progress Notes (Signed)
Subjective:    Patient ID: William NievesColin M [place], male    DOB: Apr 02, 1981, 37 y.o.   MRN: [id number]  HPI Here for follow up of his cough  Cough has gone Will still get a slight wheeze with running---but not SOB Using the inhaler most mornings--and usually before the gym  Allergy symptoms are down in past couple of weeks No rhinorrhea or sneezing  Taking the PPI Notices clear difference---no reflux symptoms now No dysphagia  Current Outpatient Medications on File Prior to Visit  Medication Sig Dispense Refill  . montelukast (SINGULAIR) 10 MG tablet Take 1 tablet (10 mg total) by mouth at bedtime. 90 tablet 3  . omeprazole (PRILOSEC) 20 MG capsule Take 1 capsule (20 mg total) by mouth daily. 90 capsule 3  . albuterol (PROVENTIL HFA;VENTOLIN HFA) 108 (90 Base) MCG/ACT inhaler Inhale 2 puffs into the lungs every 6 (six) hours as needed for wheezing or shortness of breath. 1 Inhaler 1   No current facility-administered medications on file prior to visit.     No Known Allergies  Past Medical History:  Diagnosis Date  . Allergy   . Anxiety   . Asthma   . GERD (gastroesophageal reflux disease)     Past Surgical History:  Procedure Laterality Date  . ORCHIECTOMY     L orchiectomy and R orchiopexy for torsion   2002  . PILONIDAL CYST EXCISION  2000 & 2001   3 times    Family History  Problem Relation Age of Onset  . Migraines Mother   . Migraines Father   . Cancer Neg Hx     Social History   Socioeconomic History  . Marital status: Married    Spouse name: Not on file  . Number of children: 2  . Years of education: Not on file  . Highest education level: Not on file  Occupational History  . 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 Financial resource strain: Not on file  . Food insecurity:    Worry: Not on file    Inability: Not on file  . Transportation needs:    Medical: Not on file   Non-medical: Not on file  Tobacco Use  . Smoking status: Never Smoker  . Smokeless tobacco: Never Used  Substance and Sexual Activity  . Alcohol use: Yes    Comment: occasional  . Drug use: No  . Sexual activity: Not on file  Lifestyle  . Physical activity:    Days per week: Not on file    Minutes per session: Not on file  . Stress: Not on file  Relationships  . Social connections:    Talks on phone: Not on file    Gets together: Not on file    Attends religious service: Not on file    Active member of club or organization: Not on file    Attends meetings of clubs or organizations: Not on file    Relationship status: Not on file  . Intimate partner violence:    Fear of current or ex partner: Not on file    Emotionally abused: Not on file    Physically abused: Not on file    Forced sexual activity: Not on file  Other Topics Concern  . Not on file  Social History Narrative  . Not on file   Review of Systems Appetite is fine Sleeps about his usual Does try to avoid eating before  sleep    Objective:   Physical Exam  Constitutional: He appears well-developed. No distress.  Neck: No thyromegaly present.  Cardiovascular: Normal rate, regular rhythm and normal heart sounds. Exam reveals no gallop.  No murmur heard. Respiratory: Effort normal and breath sounds normal. No respiratory distress. He has no wheezes. He has no rales.  Lymphadenopathy:    He has no cervical adenopathy.           Assessment & Plan:

## 2018-05-21 DIAGNOSIS — R51 Headache: Secondary | ICD-10-CM | POA: Diagnosis not present

## 2018-05-21 DIAGNOSIS — G4733 Obstructive sleep apnea (adult) (pediatric): Secondary | ICD-10-CM | POA: Diagnosis not present

## 2018-05-21 DIAGNOSIS — I208 Other forms of angina pectoris: Secondary | ICD-10-CM | POA: Diagnosis not present

## 2018-05-21 DIAGNOSIS — R0602 Shortness of breath: Secondary | ICD-10-CM | POA: Diagnosis not present

## 2018-05-28 DIAGNOSIS — R0602 Shortness of breath: Secondary | ICD-10-CM | POA: Diagnosis not present

## 2018-05-28 DIAGNOSIS — I208 Other forms of angina pectoris: Secondary | ICD-10-CM | POA: Diagnosis not present

## 2018-06-05 DIAGNOSIS — R0602 Shortness of breath: Secondary | ICD-10-CM | POA: Diagnosis not present

## 2018-06-05 DIAGNOSIS — I208 Other forms of angina pectoris: Secondary | ICD-10-CM | POA: Diagnosis not present

## 2018-06-06 DIAGNOSIS — R0602 Shortness of breath: Secondary | ICD-10-CM | POA: Diagnosis not present

## 2018-06-06 DIAGNOSIS — G4733 Obstructive sleep apnea (adult) (pediatric): Secondary | ICD-10-CM | POA: Diagnosis not present

## 2018-06-06 DIAGNOSIS — I208 Other forms of angina pectoris: Secondary | ICD-10-CM | POA: Diagnosis not present

## 2018-06-06 DIAGNOSIS — R51 Headache: Secondary | ICD-10-CM | POA: Diagnosis not present

## 2018-06-26 ENCOUNTER — Ambulatory Visit: Payer: Self-pay | Admitting: Medical

## 2018-06-26 ENCOUNTER — Encounter: Payer: Self-pay | Admitting: Medical

## 2018-06-26 VITALS — BP 140/87 | HR 89 | Temp 97.8°F | Resp 18 | Wt 270.8 lb

## 2018-06-26 DIAGNOSIS — F419 Anxiety disorder, unspecified: Secondary | ICD-10-CM

## 2018-06-26 MED ORDER — HYDROXYZINE HCL 10 MG PO TABS: 10.0000 mg | ORAL_TABLET | Freq: Three times a day (TID) | ORAL | 0 refills | Status: DC | PRN

## 2018-06-26 NOTE — Progress Notes (Signed)
Subjective:    Patient ID: William Stuart, male    DOB: 12/27/1980, 36 y.o.   MRN: [id number]  HPI 37 yo male in non acute distress.  Complains of fluttering in hs stomach (epigastric area) x 2-3 weeks but constand last week.  Diagnoised with GERD 02/23/18 placed on PPI omeprazole 20mg /d. Eating at dining hall. Ate BBQ sandwhich yesterday. Says he is not eating well at all.. Drinks  2 diet cokes per day.  Wonders if it  is his heart ,  Had recent work up with cardiology, stress test, and echo and EKG were all within normal limits per patient.Marland Kitchen Has had Anxiety usually with headaches, shortness of breath and chest pain.  Has been thinking of going to counseling for his Anxiety.. Felt flutter in stomach as well, no pain and some tremors in  hands. Episodes were on /off and then last week it seemed all the time.Does feel better today. Hurt his back ( squatting with bar on back 250 lbs ) end of October and could not go to the gym. Returned to the gym yesterday, states he did not have an outlet to manage his anxiety..  Was taking Advil 400mg  but only took  2 doses for his back. Can feel his back still at the right SI joint if getting up from laying down."It is improving". No loss of bowel or bladder or buttock /leg pain..    Blood pressure 140/87, pulse 89, temperature 97.8 F (36.6 C), temperature source Tympanic, resp. rate 18, weight 270 lb 12.8 oz (122.8 kg), SpO2 97 %.  No Known Allergies  Review of Systems  Constitutional: Positive for fatigue (little bit, wakes up with the flutter in stomach and difficult to return to sleep). Negative for chills and fever.  HENT: Negative for congestion and sore throat.   Respiratory: Positive for shortness of breath (sometimes with Anxiety). Negative for cough.   Cardiovascular: Negative for chest pain.  Gastrointestinal: Positive for abdominal distention (sometimes) and nausea (last week occasionally). Negative for abdominal pain, anal bleeding,  blood in stool, constipation, diarrhea, rectal pain and vomiting.  Endocrine: Negative for polydipsia, polyphagia and polyuria.  Genitourinary: Negative for dysuria.  Musculoskeletal: Positive for back pain (better now , saw chiropractor 10 days ago, adjusted). Negative for gait problem.  Skin: Negative for rash.  Neurological: Negative for dizziness, syncope and light-headedness.  Psychiatric/Behavioral: Positive for agitation and sleep disturbance (sleep disturbances wakes up about 2am  then is restless. ). Negative for confusion, decreased concentration, hallucinations, self-injury and suicidal ideas. The patient is nervous/anxious. The patient is not hyperactive.    Hx of right side piriformis muscle  2 years ago resolved through PT. Hx of tight hamstrings.  Hx of anxiety in mother and sister who takes medication. He also was prescribed medication for anxiety about 10 years ago ,  He took it for awhile, felt better so then stopped the medication.    Objective:   Physical Exam  Constitutional: He is oriented to person, place, and time. He appears well-developed and well-nourished.  HENT:  Head: Atraumatic.  Eyes: Pupils are equal, round, and reactive to light. Conjunctivae and EOM are normal.  Neck: Normal range of motion.  Cardiovascular: Normal rate and regular rhythm.  Pulmonary/Chest: Effort normal and breath sounds normal.  Abdominal: Soft. Bowel sounds are normal. He exhibits no distension and no mass. There is no tenderness. There is no rebound and no guarding. No hernia.  Neurological: He is alert and oriented to person,  place, and time.  Skin: Skin is warm and dry.  Psychiatric: He has a normal mood and affect. His behavior is normal. Judgment and thought content normal.  Nursing note and vitals reviewed.     figeting in room     Assessment & Plan:  Anxiety And GERD.increased Omeprazole to 40mg  /day.a nd given information on bland diet. Let your primary care doctor  know if this helped the stomach. To try caffeine free cokes. Meds ordered this encounter  Medications  . hydrOXYzine (ATARAX/VISTARIL) 10 MG tablet    Sig: Take 1 tablet (10 mg total) by mouth 3 (three) times daily as needed.    Dispense:  30 tablet    Refill:  0   Cautioned patient on sedation with medication. Given [employer] work[employer]life [employer] information for counseling.  To follow up with Dr. Alphonsus SiasLetvak about perhaps starting an SSRI for anxiety. Patient verbalizes understanding , thankful for the help and has no questions at discharge. To return to the clinic as needed.

## 2018-06-26 NOTE — Patient Instructions (Signed)
Bland Diet A bland diet consists of foods that do not have a lot of fat or fiber. Foods without fat or fiber are easier for the body to digest. They are also less likely to irritate your mouth, throat, stomach, and other parts of your gastrointestinal tract. A bland diet is sometimes called a BRAT diet. What is my plan? Your health care provider or dietitian may recommend specific changes to your diet to prevent and treat your symptoms, such as:  Eating small meals often.  Cooking food until it is soft enough to chew easily.  Chewing your food well.  Drinking fluids slowly.  Not eating foods that are very spicy, sour, or fatty.  Not eating citrus fruits, such as oranges and grapefruit.  What do I need to know about this diet?  Eat a variety of foods from the bland diet food list.  Do not follow a bland diet longer than you have to.  Ask your health care provider whether you should take vitamins. What foods can I eat? Grains  Hot cereals, such as cream of wheat. Bread, crackers, or tortillas made from refined white flour. Rice. Vegetables Canned or cooked vegetables. Mashed or boiled potatoes. Fruits Bananas. Applesauce. Other types of cooked or canned fruit with the skin and seeds removed, such as canned peaches or pears. Meats and Other Protein Sources Scrambled eggs. Creamy peanut butter or other nut butters. Lean, well-cooked meats, such as chicken or fish. Tofu. Soups or broths. Dairy Low-fat dairy products, such as milk, cottage cheese, or yogurt. Beverages Water. Herbal tea. Apple juice. Sweets and Desserts Pudding. Custard. Fruit gelatin. Ice cream. Fats and Oils Mild salad dressings. Canola or olive oil. The items listed above may not be a complete list of allowed foods or beverages. Contact your dietitian for more options. What foods are not recommended? Foods and ingredients that are often not recommended include:  Spicy foods, such as hot sauce or  salsa.  Fried foods.  Sour foods, such as pickled or fermented foods.  Raw vegetables or fruits, especially citrus or berries.  Caffeinated drinks.  Alcohol.  Strongly flavored seasonings or condiments.  The items listed above may not be a complete list of foods and beverages that are not allowed. Contact your dietitian for more information. This information is not intended to replace advice given to you by your health care provider. Make sure you discuss any questions you have with your health care provider. Document Released: 11/16/2015 Document Revised: 12/31/2015 Document Reviewed: 08/06/2014 Elsevier Interactive Patient Education  2018 [employer] Living With Anxiety After being diagnosed with an anxiety disorder, you may be relieved to know why you have felt or behaved a certain way. It is natural to also feel overwhelmed about the treatment ahead and what it will mean for your life. With care and support, you can manage this condition and recover from it. How to cope with anxiety Dealing with stress Stress is your body's reaction to life changes and events, both good and bad. Stress can last just a few hours or it can be ongoing. Stress can play a major role in anxiety, so it is important to learn both how to cope with stress and how to think about it differently. Talk with your health care provider or a counselor to learn more about stress reduction. He or she may suggest some stress reduction techniques, such as:  Music therapy. This can include creating or listening to music that you enjoy and that inspires you.  Mindfulness-based meditation. This involves being aware of your normal breaths, rather than trying to control your breathing. It can be done while sitting or walking.  Centering prayer. This is a kind of meditation that involves focusing on a word, phrase, or sacred image that is meaningful to you and that brings you peace.  Deep breathing. To do this, expand your  stomach and inhale slowly through your nose. Hold your breath for 3-5 seconds. Then exhale slowly, allowing your stomach muscles to relax.  Self-talk. This is a skill where you identify thought patterns that lead to anxiety reactions and correct those thoughts.  Muscle relaxation. This involves tensing muscles then relaxing them.  Choose a stress reduction technique that fits your lifestyle and personality. Stress reduction techniques take time and practice. Set aside 5-15 minutes a day to do them. Therapists can offer training in these techniques. The training may be covered by some insurance plans. Other things you can do to manage stress include:  Keeping a stress diary. This can help you learn what triggers your stress and ways to control your response.  Thinking about how you respond to certain situations. You may not be able to control everything, but you can control your reaction.  Making time for activities that help you relax, and not feeling guilty about spending your time in this way.  Therapy combined with coping and stress-reduction skills provides the best chance for successful treatment. Medicines Medicines can help ease symptoms. Medicines for anxiety include:  Anti-anxiety drugs.  Antidepressants.  Beta-blockers.  Medicines may be used as the main treatment for anxiety disorder, along with therapy, or if other treatments are not working. Medicines should be prescribed by a health care provider. Relationships Relationships can play a big part in helping you recover. Try to spend more time connecting with trusted friends and family members. Consider going to couples counseling, taking family education classes, or going to family therapy. Therapy can help you and others better understand the condition. How to recognize changes in your condition Everyone has a different response to treatment for anxiety. Recovery from anxiety happens when symptoms decrease and stop  interfering with your daily activities at home or work. This may mean that you will start to:  Have better concentration and focus.  Sleep better.  Be less irritable.  Have more energy.  Have improved memory.  It is important to recognize when your condition is getting worse. Contact your health care provider if your symptoms interfere with home or work and you do not feel like your condition is improving. Where to find help and support: You can get help and support from these sources:  Self-help groups.  Online and [employer].  A trusted spiritual leader.  Couples counseling.  Family education classes.  Family therapy.  Follow these instructions at home:  Eat a healthy diet that includes plenty of vegetables, fruits, whole grains, low-fat dairy products, and lean protein. Do not eat a lot of foods that are high in solid fats, added sugars, or salt.  Exercise. Most adults should do the following: ? Exercise for at least 150 minutes each week. The exercise should increase your heart rate and make you sweat (moderate-intensity exercise). ? Strengthening exercises at least twice a week.  Cut down on caffeine, tobacco, alcohol, and other potentially harmful substances.  Get the right amount and quality of sleep. Most adults need 7-9 hours of sleep each night.  Make choices that simplify your life.  Take over-the-counter and prescription  medicines only as told by your health care provider.  Avoid caffeine, alcohol, and certain over-the-counter cold medicines. These may make you feel worse. Ask your pharmacist which medicines to avoid.  Keep all follow-up visits as told by your health care provider. This is important. Questions to ask your health care provider  Would I benefit from therapy?  How often should I follow up with a health care provider?  How long do I need to take medicine?  Are there any long-term side effects of my medicine?  Are there any  alternatives to taking medicine? Contact a health care provider if:  You have a hard time staying focused or finishing daily tasks.  You spend many hours a day feeling worried about everyday life.  You become exhausted by worry.  You start to have headaches, feel tense, or have nausea.  You urinate more than normal.  You have diarrhea. Get help right away if:  You have a racing heart and shortness of breath.  You have thoughts of hurting yourself or others. If you ever feel like you may hurt yourself or others, or have thoughts about taking your own life, get help right away. You can go to your nearest emergency department or call:  Your local emergency services (911 in the [place]  A suicide crisis helpline, such as the National Suicide Prevention Lifeline at 864 323 3413. This is open 24-hours a day.  Summary  Taking steps to deal with stress can help calm you.  Medicines cannot cure anxiety disorders, but they can help ease symptoms.  Family, friends, and partners can play a big part in helping you recover from an anxiety disorder. This information is not intended to replace advice given to you by your health care provider. Make sure you discuss any questions you have with your health care provider. Document Released: 07/19/2016 Document Revised: 07/19/2016 Document Reviewed: 07/19/2016 Elsevier Interactive Patient Education  [employer].

## 2018-07-16 ENCOUNTER — Ambulatory Visit: Payer: BLUE CROSS/BLUE SHIELD | Admitting: Internal Medicine

## 2018-07-16 ENCOUNTER — Encounter: Payer: Self-pay | Admitting: Internal Medicine

## 2018-07-16 VITALS — BP 126/90 | HR 95 | Temp 97.9°F | Ht 74.0 in | Wt 274.0 lb

## 2018-07-16 DIAGNOSIS — R109 Unspecified abdominal pain: Secondary | ICD-10-CM | POA: Insufficient documentation

## 2018-07-16 DIAGNOSIS — R1013 Epigastric pain: Secondary | ICD-10-CM | POA: Diagnosis not present

## 2018-07-16 MED ORDER — OMEPRAZOLE 20 MG PO CPDR: 20.0000 mg | DELAYED_RELEASE_CAPSULE | Freq: Every day | ORAL | 3 refills | Status: DC

## 2018-07-16 NOTE — Patient Instructions (Signed)
Go back to daily with the omeprazole. Let me know if you are having ongoing symptoms after 2 weeks or so.

## 2018-07-16 NOTE — Progress Notes (Signed)
Subjective:    Patient ID: William Stuart, male    DOB: [birth year], 37 y.o.   MRN: [id number]  HPI Here due to abdominal pain  [facility] month ago or so [facility] Hard to describe---rumbling in upper stomach Would feel shaky at the same time (chest and L>R arms) Pain and pressure in epigastrium--sometimes in lower abdomen 1 day last week--had 3 BMs---which is unusual for him Brief pain on both sides of his back also--dull (not clear if related)  Mostly in evenings at first---all day in past week Not clearly related to eating--though did try bland diet Occasional reflux symptoms---- did cut PPI to every third day and now every other day  Appetite is fine No weight loss Bowels regular --- 1-2 daily  (and fairly normal --though occasionally looser) No blood in stools No N/V Hasn't tried any meds for this  Current Outpatient Medications on File Prior to Visit  Medication Sig Dispense Refill  . omeprazole (PRILOSEC) 20 MG capsule Take 1 capsule (20 mg total) by mouth daily. 90 capsule 3  . hydrOXYzine (ATARAX/VISTARIL) 10 MG tablet Take 1 tablet (10 mg total) by mouth 3 (three) times daily as needed. (Patient not taking: Reported on 07/16/2018) 30 tablet 0   No current facility-administered medications on file prior to visit.     No Known Allergies  Past Medical History:  Diagnosis Date  . Allergy   . Anxiety   . Asthma   . GERD (gastroesophageal reflux disease)     Past Surgical History:  Procedure Laterality Date  . ORCHIECTOMY     L orchiectomy and R orchiopexy for torsion   2002  . PILONIDAL CYST EXCISION  2000 & 2001   3 times    Family History  Problem Relation Age of Onset  . Migraines Mother   . Migraines Father   . Cancer Neg Hx     Social History   Socioeconomic History  . Marital status: Married    Spouse name: Not on file  . Number of children: 2  . Years of education: Not on file  . Highest education level: Not on file  Occupational  History  . 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 Financial resource strain: Not on file  . Food insecurity:    Worry: Not on file    Inability: Not on file  . Transportation needs:    Medical: Not on file    Non-medical: Not on file  Tobacco Use  . Smoking status: Never Smoker  . Smokeless tobacco: Never Used  Substance and Sexual Activity  . Alcohol use: Yes    Comment: occasional  . Drug use: No  . Sexual activity: Not on file  Lifestyle  . Physical activity:    Days per week: Not on file    Minutes per session: Not on file  . Stress: Not on file  Relationships  . Social connections:    Talks on phone: Not on file    Gets together: Not on file    Attends religious service: Not on file    Active member of club or organization: Not on file    Attends meetings of clubs or organizations: Not on file    Relationship status: Not on file  . Intimate partner violence:    Fear of current or ex partner: Not on file    Emotionally abused: Not on file  Physically abused: Not on file    Forced sexual activity: Not on file  Other Topics Concern  . Not on file  Social History Narrative  . Not on file   Review of Systems Recent visit with cardiologist---echo and stress test fine (had DOE to prompt this) No dysuria or hematuria. Flow is fine Some cough for past week---doesn't feel sick Slight fecal drainage at times (not onto underwear though)    Objective:   Physical Exam  Constitutional: He appears well-developed. No distress.  Neck: No thyromegaly present.  Cardiovascular: Normal rate, regular rhythm and normal heart sounds. Exam reveals no gallop.  No murmur heard. Respiratory: Effort normal and breath sounds normal. No respiratory distress. He has no wheezes. He has no rales.  GI: Soft. Bowel sounds are normal. He exhibits no distension. There is no tenderness. There is no rebound and no guarding.    Musculoskeletal: He exhibits no edema.  Lymphadenopathy:    He has no cervical adenopathy.           Assessment & Plan:

## 2018-07-16 NOTE — Assessment & Plan Note (Signed)
Vague symptoms for 1 month No fever, weight loss or other systemic symptoms Mom does have Crohn's --but doesn't seem to fit IBD Could be related to acid--with lower omeprazole dose---asked him to go back to daily ?gastritis  Does have stress issues--but doesn't seem like that Small chance of gallbladder disease or pancreatitis Will check some labs Consider ultrasound and GI referral if ongoing symptoms

## 2018-07-17 LAB — COMPREHENSIVE METABOLIC PANEL
ALK PHOS: 52 U/L (ref 39–117)
ALT: 14 U/L (ref 0–53)
AST: 23 U/L (ref 0–37)
Albumin: 4.4 g/dL (ref 3.5–5.2)
BILIRUBIN TOTAL: 0.5 mg/dL (ref 0.2–1.2)
BUN: 16 mg/dL (ref 6–23)
CO2: 30 meq/L (ref 19–32)
Calcium: 9.5 mg/dL (ref 8.4–10.5)
Chloride: 102 mEq/L (ref 96–112)
Creatinine, Ser: 1.18 mg/dL (ref 0.40–1.50)
GFR: 73.83 mL/min (ref >60.00)
Glucose, Bld: 67 mg/dL — ABNORMAL LOW (ref 70–99)
Potassium: 4.2 mEq/L (ref 3.5–5.1)
Sodium: 138 mEq/L (ref 135–145)
TOTAL PROTEIN: 7.7 g/dL (ref 6.0–8.3)

## 2018-07-17 LAB — CBC
HCT: 42.7 % (ref 39.0–52.0)
Hemoglobin: 14.5 g/dL (ref 13.0–17.0)
MCHC: 33.9 g/dL (ref 30.0–36.0)
MCV: 96.7 fl (ref 78.0–100.0)
PLATELETS: 249 10*3/uL (ref 150.0–400.0)
RBC: 4.42 Mil/uL (ref 4.22–5.81)
RDW: 12.8 % (ref 11.5–15.5)
WBC: 8.7 10*3/uL (ref 4.0–10.5)

## 2018-07-17 LAB — SEDIMENTATION RATE: Sed Rate: 12 mm/hr (ref 0–15)

## 2018-09-13 ENCOUNTER — Ambulatory Visit: Payer: BLUE CROSS/BLUE SHIELD | Admitting: Internal Medicine

## 2018-09-19 ENCOUNTER — Encounter: Payer: Self-pay | Admitting: Internal Medicine

## 2018-09-19 ENCOUNTER — Ambulatory Visit: Payer: BLUE CROSS/BLUE SHIELD | Admitting: Internal Medicine

## 2018-09-19 VITALS — BP 104/80 | HR 113 | Temp 97.9°F | Ht 74.0 in | Wt 273.0 lb

## 2018-09-19 DIAGNOSIS — M549 Dorsalgia, unspecified: Secondary | ICD-10-CM | POA: Diagnosis not present

## 2018-09-19 DIAGNOSIS — R8290 Unspecified abnormal findings in urine: Secondary | ICD-10-CM | POA: Diagnosis not present

## 2018-09-19 LAB — POC URINALSYSI DIPSTICK (AUTOMATED)
BILIRUBIN UA: NEGATIVE
Blood, UA: NEGATIVE
Glucose, UA: NEGATIVE
Ketones, UA: NEGATIVE
Leukocytes, UA: NEGATIVE
Nitrite, UA: NEGATIVE
Protein, UA: NEGATIVE
Spec Grav, UA: 1.02 (ref 1.010–1.025)
Urobilinogen, UA: 0.2 E.U./dL
pH, UA: 6 (ref 5.0–8.0)

## 2018-09-19 NOTE — Progress Notes (Signed)
Subjective:    Patient ID: William Stuart, male    DOB: 1981/03/09, 38 y.o.   MRN: [id number]  HPI Here due to back pain  Started a couple of weeks ago Mostly in mid back Doesn't remember any injury---does same lifting in gym and no change Slow onset and worsened as day went on Dull type pain No sig radiation May be worse if just standing Less apparent in the gym No Rx for this  Has also noted odor in urine--so that concerned him Intermittent---2-3 days per week last week Drinking his normal amount Normal color--no dysuria or hematuria  Current Outpatient Medications on File Prior to Visit  Medication Sig Dispense Refill  . omeprazole (PRILOSEC) 20 MG capsule Take 1 capsule (20 mg total) by mouth daily. 90 capsule 3   No current facility-administered medications on file prior to visit.     No Known Allergies  Past Medical History:  Diagnosis Date  . Allergy   . Anxiety   . Asthma   . GERD (gastroesophageal reflux disease)     Past Surgical History:  Procedure Laterality Date  . ORCHIECTOMY     L orchiectomy and R orchiopexy for torsion   2002  . PILONIDAL CYST EXCISION  2000 & 2001   3 times    Family History  Problem Relation Age of Onset  . Migraines Mother   . Migraines Father   . Cancer Neg Hx     Social History   Socioeconomic History  . Marital status: Married    Spouse name: Not on file  . Number of children: 2  . Years of education: Not on file  . Highest education level: Not on file  Occupational History  . 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 Financial resource strain: Not on file  . Food insecurity:    Worry: Not on file    Inability: Not on file  . Transportation needs:    Medical: Not on file    Non-medical: Not on file  Tobacco Use  . Smoking status: Never Smoker  . Smokeless tobacco: Never Used  Substance and Sexual Activity  . Alcohol use: Yes   Comment: occasional  . Drug use: No  . Sexual activity: Not on file  Lifestyle  . Physical activity:    Days per week: Not on file    Minutes per session: Not on file  . Stress: Not on file  Relationships  . Social connections:    Talks on phone: Not on file    Gets together: Not on file    Attends religious service: Not on file    Active member of club or organization: Not on file    Attends meetings of clubs or organizations: Not on file    Relationship status: Not on file  . Intimate partner violence:    Fear of current or ex partner: Not on file    Emotionally abused: Not on file    Physically abused: Not on file    Forced sexual activity: Not on file  Other Topics Concern  . Not on file  Social History Narrative  . Not on file   Review of Systems Some decrease in appetite No leg weakness No problems sleeping    Objective:   Physical Exam  GI: Soft. He exhibits no distension. There is no abdominal tenderness. There is no rebound and no guarding.  Musculoskeletal:  Comments: No back tenderness (pain area is low thoracic/upper lumbar bilaterally) SLR negative Normal ROM in hips Normal back flexion  Neurological:  Normal gait Normal leg strength           Assessment & Plan:

## 2018-09-19 NOTE — Assessment & Plan Note (Signed)
No worrisome features Clearly seems to be muscular Not sure about other symptoms---not clearly related

## 2018-09-19 NOTE — Assessment & Plan Note (Addendum)
Will check urinalysis--negative Asked him to increase fluids and monitor

## 2018-10-12 IMAGING — DX DG CHEST 2V
2 series · 2 of 2 positions shown · non-contrast
Comparison: None in PACs

CLINICAL DATA: Four weeks of cough. History of asthma and
gastroesophageal reflux.. Nonsmoker.

EXAM:
CHEST - 2 VIEW

[chest pa]
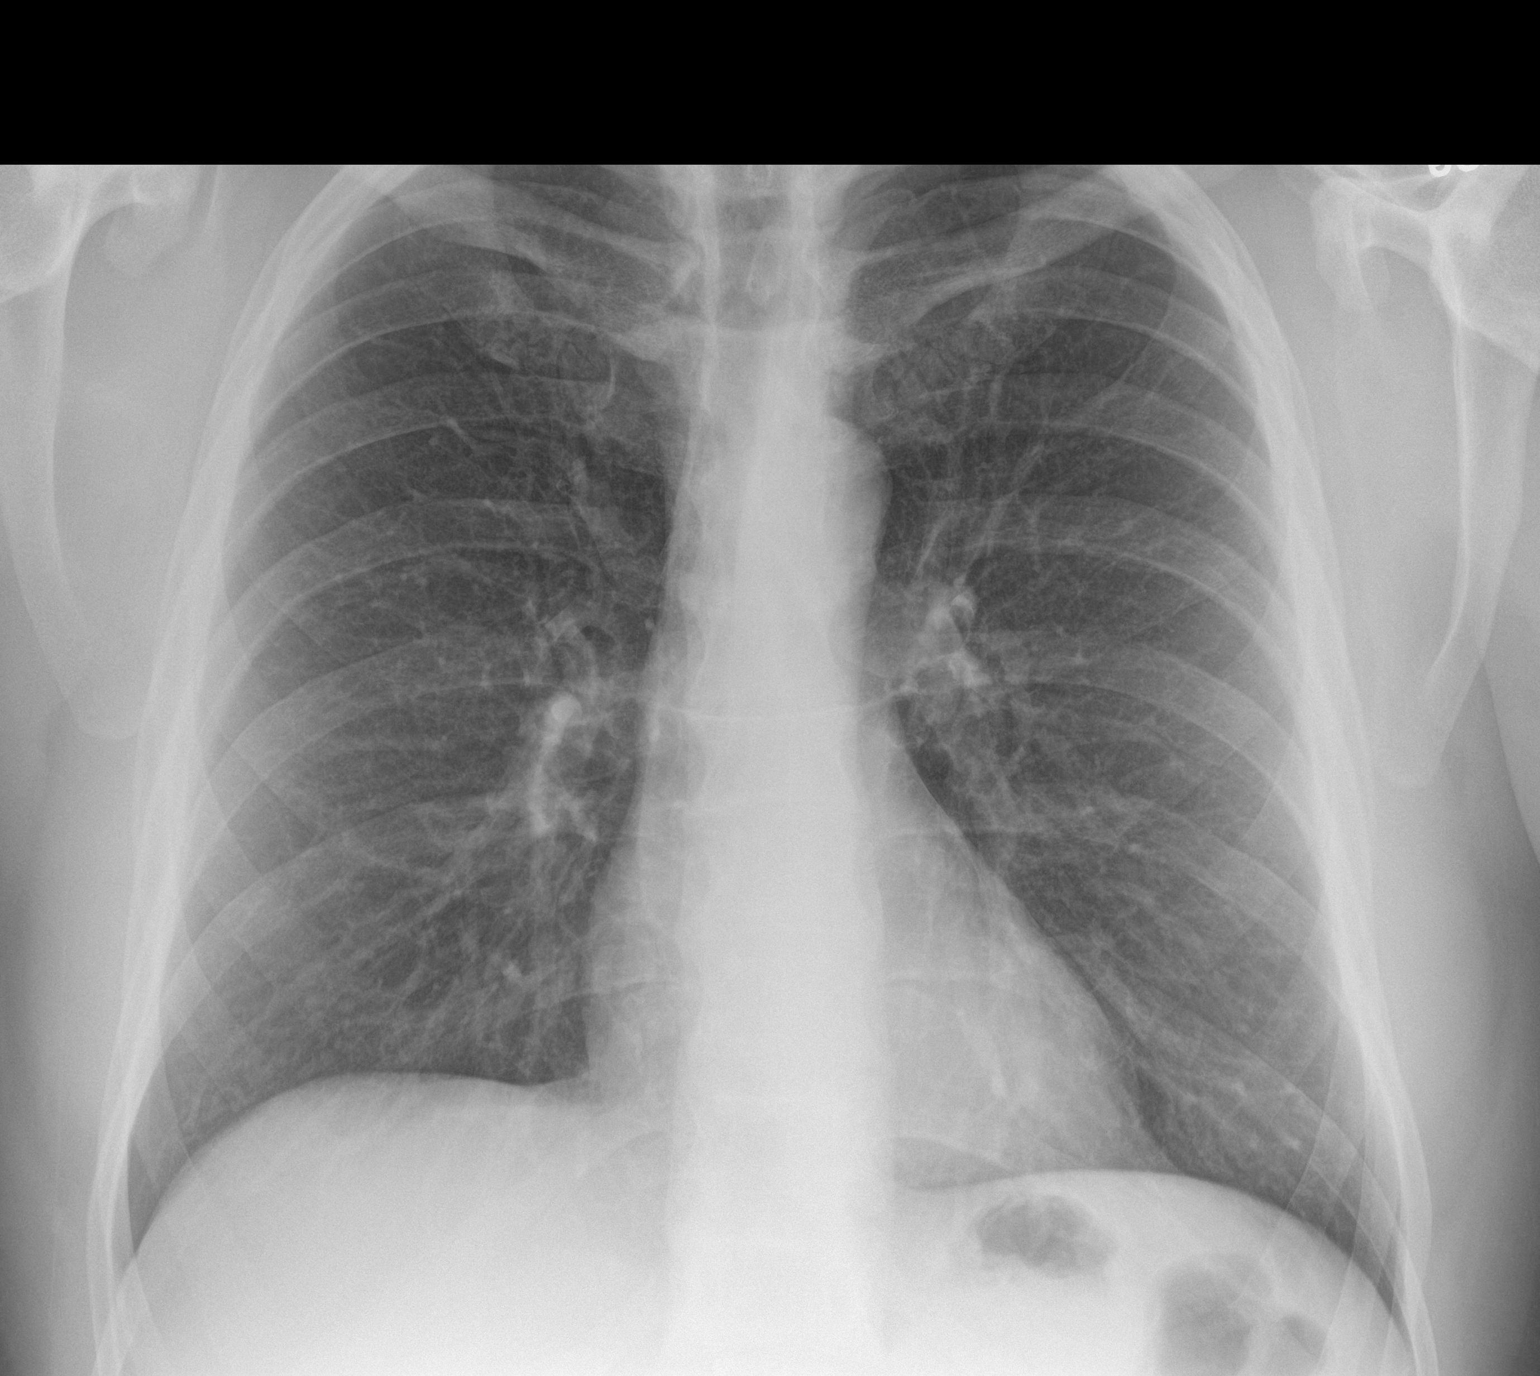

[chest lat]
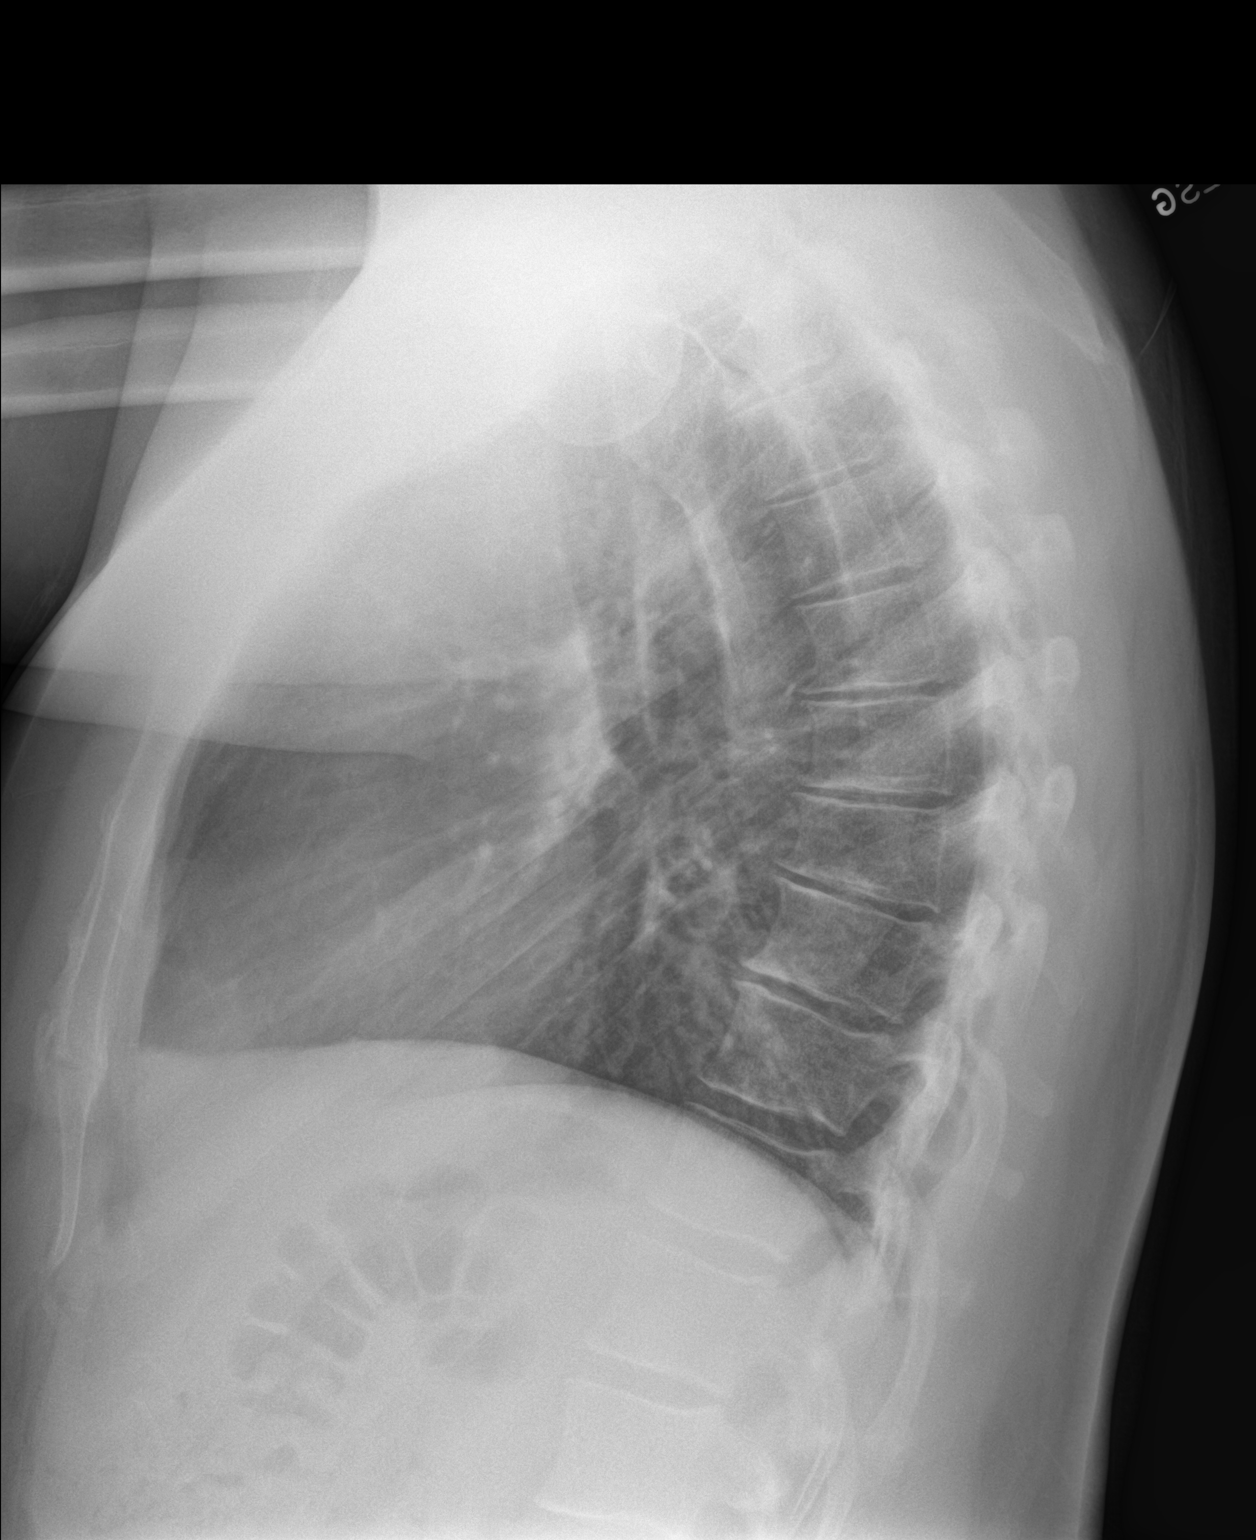

[2 of 2 positions shown; findings below may reference images not displayed]

FINDINGS: The lungs are mildly hyperinflated with mild hemidiaphragm
flattening. There is no focal infiltrate. There is no pleural
effusion. The heart and pulmonary vascularity are normal. The
mediastinum is normal in width. The trachea is midline. There is no
pleural effusion. The bony thorax exhibits no acute abnormality.
IMPRESSION: Mild hyperinflation consistent with reactive airway disease. No
pneumonia nor other acute cardiopulmonary abnormality.

## 2018-10-18 ENCOUNTER — Other Ambulatory Visit: Payer: Self-pay

## 2018-10-18 ENCOUNTER — Encounter: Payer: Self-pay | Admitting: Nurse Practitioner

## 2018-10-18 ENCOUNTER — Ambulatory Visit: Payer: Self-pay | Admitting: Nurse Practitioner

## 2018-10-18 VITALS — BP 146/84 | HR 84 | Temp 98.1°F | Resp 16 | Ht 74.0 in | Wt 275.0 lb

## 2018-10-18 DIAGNOSIS — T675XXA Heat exhaustion, unspecified, initial encounter: Secondary | ICD-10-CM

## 2018-10-18 NOTE — Patient Instructions (Addendum)
Jerad it was nice meeting you today!  I feel strongly that you may have gotten overheated this morning during your workout. If this should occur again, make sure you are in a well ventilated areas or get some air and drink fluids. Consider changing morning breakfast to something that has more protein.   If this does occur again, please call your cardiologist for further evaluation at treatment Continue to remember ways to keep your anxiety down as this could have been exacerbated during this event  Please rest the rest of the day, get a good well balanced meal with adequate hydration  Heat Exhaustion Heat exhaustion happens when your body gets overheated from hot weather or from exercise. Heat exhaustion can lead to heat stroke, a life-threatening condition that requires emergency care. Heat exhaustion is more likely to develop when:  You are exercising or being active.  You are in hot or humid weather.  You are in bright sunshine.  You are not drinking enough water. It is important to take care of yourself and treat heat exhaustion as soon as possible. Untreated heat exhaustion can turn into heat stroke, which is a life-threatening condition that requires urgent medical treatment. What increases the risk? This condition is more likely to develop in:  People who exercise in hot or humid weather.  People who exercise beyond their fitness level.  People who wear clothing that does not allow sweat to evaporate.  People who are dehydrated.  People who drink a lot of alcoholic beverages or beverages that have caffeine. This can lead to dehydration.  People who are age 38 or older.  Children.  People who have a medical condition such as heart disease, poor circulation, sickle cell disease, or high blood pressure.  People who have a fever.  People who are very overweight (obese). What are the signs or symptoms? Symptoms of heat exhaustion include:  Heavy sweating along with  feeling weak, dizzy, light-headed, and nauseous.  Rapid heartbeat.  Headache.  Urine that is darker than normal.  Muscle cramps, such as in the leg or side (flank).  Moist, cool, and clammy skin.  Fatigue.  Thirst.  Confusion.  Fainting. Follow these instructions at home:  If you think that you have heat exhaustion, call your health care provider. Follow his or her instructions. You should also:  Call a friend or a family member and ask him or her to stay with you.  Move to a cooler location, such as: ? Into the shade. ? In front of a fan. ? An air-conditioned space.  Lie down and rest.  Slowly drink nonalcoholic, caffeine-free fluids.  Take off tight clothing or extra clothing.  Take a cool bath or shower, if possible. If you do not have access to a bath or shower, dab or mist cool water on your skin. Contact a health care provider if:  Your symptoms last longer than 30 minutes. Get help right away if:  You have any symptoms of heat stroke. These include: ? Fever. ? Vomiting. ? Red skin. ? Inability to sweat, resulting in hot, dry skin. ? Excessive thirst. ? Rapid breathing. ? Headache. ? Confusion or disorientation. ? Fainting. ? Seizures. These symptoms may represent a serious problem that is an emergency. Do not wait to see if the symptoms will go away. Get medical help right away. Call your local emergency services (911 in the [place] Do not drive yourself to the hospital. This information is not intended to replace advice given to  you by your health care provider. Make sure you discuss any questions you have with your health care provider. Document Released: 05/03/2008 Document Revised: 01/23/2017 Document Reviewed: 11/15/2015 Elsevier Interactive Patient Education  2019 [employer].

## 2018-10-18 NOTE — Progress Notes (Signed)
   Subjective:    Patient ID: William Stuart, male    DOB: [birth year], 38 y.o.   MRN: [id number]  HPI William Stuart comes to the clinic today with c/o dizziness, lightheaded when working out this am; which he no longer has. He reports similar symptoms last year in which he was since by Cardiology, Dr. Juliann Pares which he underwent an echo which showed mild tricuspid valve regurg, ECG normal. Unable to see stress test. He has been seeing cardiology dating back to 2016. He was dx'd with angina at rest during that time. He reports SOB at times when climbing stairs or even talking but able to do daily activities. He denies syncopal episode, nausea, or vomiting. He denies actually feeling palpitations but reports he has a fluttering in his throat; which he was told was palpitations. His v/s in office have been reviewed and are stable. He reports he feels "run down". He has a hx of asthma,mild OSA (no cpap), anxiety, GERD. No meds, except omeprazole.  He reports during this event he was doing pull ups during a high intensity workout class which is not a new exercise for him he was able to continue the 1 hour workout. During the acute event of dizziness and heart rate racing he briefly stopped and symptoms did resolve. He reports he ate cereal; which is common.  FH: MGF: HTN and hypercholesteremia but unsure of of CV hx as he was found deceased in his sleep  Review of Systems  Constitutional: Positive for fatigue.  Cardiovascular: Negative for chest pain.       Heart racing  Neurological: Positive for dizziness and light-headedness.       Objective:   Physical Exam Vitals signs reviewed.  Constitutional:      Appearance: Normal appearance. He is well-developed.     Comments: Looks a bit restless, hands clammy  HENT:     Head: Normocephalic and atraumatic.  Neck:     Musculoskeletal: Normal range of motion and neck supple.     Vascular: No carotid bruit.  Cardiovascular:     Rate and Rhythm: Normal  rate and regular rhythm.     Pulses: Normal pulses.     Heart sounds: Normal heart sounds.     Comments: No lower extremity edema Pulmonary:     Effort: Pulmonary effort is normal. No respiratory distress.     Breath sounds: Normal breath sounds.  Abdominal:     General: Bowel sounds are normal.     Palpations: Abdomen is soft.  Musculoskeletal: Normal range of motion.  Skin:    General: Skin is warm and dry.  Neurological:     Mental Status: He is alert and oriented to person, place, and time.  Psychiatric:        Mood and Affect: Mood normal.           Assessment & Plan:

## 2018-11-06 MED ORDER — OMEPRAZOLE 20 MG PO CPDR: 20.0000 mg | DELAYED_RELEASE_CAPSULE | Freq: Every day | ORAL | 3 refills | Status: DC

## 2018-11-23 ENCOUNTER — Ambulatory Visit (INDEPENDENT_AMBULATORY_CARE_PROVIDER_SITE_OTHER): Payer: BLUE CROSS/BLUE SHIELD | Admitting: Family Medicine

## 2018-11-23 ENCOUNTER — Encounter: Payer: Self-pay | Admitting: Family Medicine

## 2018-11-23 ENCOUNTER — Telehealth: Payer: Self-pay

## 2018-11-23 ENCOUNTER — Other Ambulatory Visit: Payer: Self-pay

## 2018-11-23 DIAGNOSIS — R1031 Right lower quadrant pain: Secondary | ICD-10-CM | POA: Insufficient documentation

## 2018-11-23 NOTE — Telephone Encounter (Signed)
Pt scheduled virtual visit and symptoms are on appt notes.

## 2018-11-23 NOTE — Progress Notes (Signed)
Virtual Visit via Video Note  I connected with William Stuart on 11/23/18 at  3:45 PM EDT by a video enabled telemedicine application and verified that I am speaking with the correct person using two identifiers. The patient is at home today  I am in my office    I discussed the limitations of evaluation and management by telemedicine and the availability of in person appointments. The patient expressed understanding and agreed to proceed.  History of Present Illness: C/o sharp pain in low abdomen radiating to his back  10/30 am  R lower abdomen (? Muscular) - rad to buttock and low back  This comes and goes (hits him and goes away immediately) - sharp and significant but not severe  No particular triggers   Now some more dull low back pain   Had been playing with kids outdoors yesterday  Feels it in the hip/low back on R side   Just sitting he feels dull pain in the hip (groin) injury  No h/o hip arthritis   No tenderness to the touch in the abdomen/groin or hip   Has not tried ice or heat   No rash -no blisters or bumps  No burning or sensitive skin   No urinary symptoms  No blood in urine or burning  He has had prostatitis in the past-this does not feel like that  No vomiting or nausea  Last BM was normal this am   Review of Systems  Constitutional: Negative for chills, fever and malaise/fatigue.  Eyes: Negative for blurred vision.  Respiratory: Negative for cough and shortness of breath.   Cardiovascular: Negative for chest pain, palpitations and leg swelling.  Gastrointestinal: Negative for abdominal pain, blood in stool, constipation, diarrhea, nausea and vomiting.  Genitourinary: Negative for dysuria, flank pain, frequency, hematuria and urgency.  Musculoskeletal: Positive for back pain. Negative for falls and joint pain.  Skin: Negative for itching and rash.  Neurological: Negative for dizziness, tingling, focal weakness and headaches.         Observations/Objective: Pt appears well, not distressed or diaphoretic Overweight  Nl affect  On camera no limited rom of spine (flex/ext/lateral bend) and no increased pain with valsalva  No rash or skin change or bulge noted on obv of the involved area  No significant tenderness when pt presses on it  No increased pain with walking/movemeent  No leg swelling     Assessment and Plan: Problem List Items Addressed This Visit      Other   Right groin pain    Intermittent fleeting and sharp this am /now more dull and radiating to his back  Not positional and not triggered by movement  No pain with palpation (by patient) and no skin changes  He reports no hernia/bulge and no h/o renal stones Disc differential including groin strain, hip pain, renal stone, early shingles w/o rash, and less likely pain of colon or appendix  Disc in detail what to watch for : inc pain. Bulge in area, rash, positional pain change , fever or n/v or stool change, or dysuria/blood in urine or stone passage (will call( Enc good water intake over the weekend  Warm/cool compress  eval was limited due to pt not being in office - inst to call the office # on sat to get sat clinic appt if needed for further eval  If severe symptoms inst to go to ED           Follow Up Instructions: Watch for  increased pain , bulge in groin, rash, urinary changes such as pain or blood or passed stone, nausea/vomiting/fever or stool change  Please keep us updated Drink fluids/water Call office # for sat clinic appt if needed tomorrow  If symptoms become severe-go to ED Update us Monday regardless     I discussed the assessment and treatment plan with the patient. The patient was provided an opportunity to ask questions and all were answered. The patient agreed with the plan and demonstrated an understanding of the instructions.   The patient was advised to call back or seek an in-person evaluation if the symptoms worsen or  if the condition fails to improve as anticipated.    Roxy MannsMarne , MD

## 2018-11-23 NOTE — Assessment & Plan Note (Signed)
Intermittent fleeting and sharp this am /now more dull and radiating to his back  Not positional and not triggered by movement  No pain with palpation (by patient) and no skin changes  He reports no hernia/bulge and no h/o renal stones Disc differential including groin strain, hip pain, renal stone, early shingles w/o rash, and less likely pain of colon or appendix  Disc in detail what to watch for : inc pain. Bulge in area, rash, positional pain change , fever or n/v or stool change, or dysuria/blood in urine or stone passage (will call( Enc good water intake over the weekend  Warm/cool compress  eval was limited due to pt not being in office - inst to call the office # on sat to get sat clinic appt if needed for further eval  If severe symptoms inst to go to ED

## 2018-12-28 ENCOUNTER — Ambulatory Visit: Payer: Self-pay | Admitting: Internal Medicine

## 2018-12-28 NOTE — Telephone Encounter (Signed)
William Stuart at front desk said pt already has appt with Dr Alphonsus Sias on 01/01/19 at 8 AM.

## 2018-12-28 NOTE — Telephone Encounter (Signed)
[facility] practice called and asked me to triage this pt    He is c/o abd discomfort.  See triage notes. I sent these notes to the office.  Pt is out of town right now and wants an appt for next Tuesday.      Reason for Disposition . Abdominal pain is a chronic symptom (recurrent or ongoing AND present > 4 weeks)  Answer Assessment - Initial Assessment Questions 1. LOCATION: "Where does it hurt?"      *No Answer*Upper and lower abd a pressure for the last week or so.   Right above my navel I feel a bump of some kind.   The stomach pain is not bad.   2. RADIATION: "Does the pain shoot anywhere else?" (e.g., chest, back)     No.    3. ONSET: "When did the pain begin?" (Minutes, hours or days ago)      Week to 2 weeks. 4. SUDDEN: "Gradual or sudden onset?"     It just started one day.   It's a tightness in the upper abd then my lower abd. 5. PATTERN "Does the pain come and go, or is it constant?"    - If constant: "Is it getting better, staying the same, or worsening?"      (Note: Constant means the pain never goes away completely; most serious pain is constant and it progresses)     - If intermittent: "How long does it last?" "Do you have pain now?"     (Note: Intermittent means the pain goes away completely between bouts)     I feel it more during the day.   Do not not interfere with sleep 6. SEVERITY: "How bad is the pain?"  (e.g., Scale 1-10; mild, moderate, or severe)    - MILD (1-3): doesn't interfere with normal activities, abdomen soft and not tender to touch     - MODERATE (4-7): interferes with normal activities or awakens from sleep, tender to touch     - SEVERE (8-10): excruciating pain, doubled over, unable to do any normal activities       3 on pain scale 7. RECURRENT SYMPTOM: "Have you ever had this type of abdominal pain before?" If so, ask: "When was the last time?" and "What happened that time?"      No 8. CAUSE: "What do you think is causing the abdominal  pain?"     No.  I have reflux.   I take Omeprazole every day.   Maybe muscular. 9. RELIEVING/AGGRAVATING FACTORS: "What makes it better or worse?" (e.g., movement, antacids, bowel movement)     Mostly the same 10. OTHER SYMPTOMS: "Has there been any vomiting, diarrhea, constipation, or urine problems?"       None of the above.  Protocols used: ABDOMINAL PAIN - MALE-A-AH

## 2018-12-30 NOTE — Telephone Encounter (Signed)
I will check things out at his appt

## 2019-01-01 ENCOUNTER — Encounter: Payer: Self-pay | Admitting: Internal Medicine

## 2019-01-01 ENCOUNTER — Ambulatory Visit: Payer: BLUE CROSS/BLUE SHIELD | Admitting: Internal Medicine

## 2019-01-01 ENCOUNTER — Other Ambulatory Visit: Payer: Self-pay

## 2019-01-01 VITALS — BP 114/88 | HR 91 | Temp 97.9°F | Ht 74.0 in | Wt 280.0 lb

## 2019-01-01 DIAGNOSIS — R1013 Epigastric pain: Secondary | ICD-10-CM

## 2019-01-01 NOTE — Progress Notes (Signed)
Subjective:    Patient ID: William Stuart, male    DOB: 1981-07-23, 38 y.o.   MRN: [id number]  HPI Visit due to abdominal pain  He seemed to be better after the visit for abdominal pain in December 2 weeks ago has had "a pressure or tightness" in epigastric area Feels it more when standing--less if sitting Not clearly related to eating Doesn't awaken him No vomiting--but did have some queasiness at first  Bowels are daily No pain or bleeding  Taking the omeprazole daily---empty stomach  Current Outpatient Medications on File Prior to Visit  Medication Sig Dispense Refill  . omeprazole (PRILOSEC) 20 MG capsule Take 1 capsule (20 mg total) by mouth daily. 90 capsule 3   No current facility-administered medications on file prior to visit.     No Known Allergies  Past Medical History:  Diagnosis Date  . Allergy   . Anxiety   . Asthma   . GERD (gastroesophageal reflux disease)     Past Surgical History:  Procedure Laterality Date  . ORCHIECTOMY     L orchiectomy and R orchiopexy for torsion   2002  . PILONIDAL CYST EXCISION  2000 & 2001   3 times    Family History  Problem Relation Age of Onset  . Migraines Mother   . Migraines Father   . Cancer Neg Hx     Social History   Socioeconomic History  . Marital status: Married    Spouse name: Not on file  . Number of children: 2  . Years of education: Not on file  . Highest education level: Not on file  Occupational History  . 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 Financial resource strain: Not on file  . Food insecurity:    Worry: Not on file    Inability: Not on file  . Transportation needs:    Medical: Not on file    Non-medical: Not on file  Tobacco Use  . Smoking status: Never Smoker  . Smokeless tobacco: Never Used  Substance and Sexual Activity  . Alcohol use: Yes    Comment: occasional  . Drug use: No  . Sexual  activity: Not on file  Lifestyle  . Physical activity:    Days per week: Not on file    Minutes per session: Not on file  . Stress: Not on file  Relationships  . Social connections:    Talks on phone: Not on file    Gets together: Not on file    Attends religious service: Not on file    Active member of club or organization: Not on file    Attends meetings of clubs or organizations: Not on file    Relationship status: Not on file  . Intimate partner violence:    Fear of current or ex partner: Not on file    Emotionally abused: Not on file    Physically abused: Not on file    Forced sexual activity: Not on file  Other Topics Concern  . Not on file  Social History Narrative  . Not on file   Review of Systems Eating okay Weight up slightly No cough or SOB The groin or RLQ pain from April seems to have resolved    Objective:   Physical Exam  Constitutional: He appears well-developed. No distress.  Neck: No thyromegaly present.  Respiratory: Effort normal and breath sounds normal. No respiratory distress.  He has no wheezes. He has no rales.  GI: Soft. Bowel sounds are normal. He exhibits no distension. There is no abdominal tenderness. There is no rebound and no guarding.  Genitourinary:    Genitourinary Comments: No hernia   Musculoskeletal:        General: No edema.  Lymphadenopathy:    He has no cervical adenopathy.           Assessment & Plan:

## 2019-01-01 NOTE — Assessment & Plan Note (Signed)
Epigastric mostly---similar to in December Had some RLQ symptoms last month--they seem better Could be ulcer or other acid related problem Doubt IBD but has family history Recurred despite PPI Will set up with GI

## 2019-01-03 ENCOUNTER — Encounter: Payer: Self-pay | Admitting: General Surgery

## 2019-01-04 ENCOUNTER — Encounter: Payer: Self-pay | Admitting: Gastroenterology

## 2019-01-04 ENCOUNTER — Other Ambulatory Visit: Payer: Self-pay

## 2019-01-04 ENCOUNTER — Ambulatory Visit (INDEPENDENT_AMBULATORY_CARE_PROVIDER_SITE_OTHER): Payer: BLUE CROSS/BLUE SHIELD | Admitting: Gastroenterology

## 2019-01-04 VITALS — Ht 74.0 in | Wt 270.0 lb

## 2019-01-04 DIAGNOSIS — R1013 Epigastric pain: Secondary | ICD-10-CM

## 2019-01-04 DIAGNOSIS — K219 Gastro-esophageal reflux disease without esophagitis: Secondary | ICD-10-CM | POA: Diagnosis not present

## 2019-01-04 NOTE — Progress Notes (Signed)
Virtual Visit via Video Note  I connected with William Stuart on 01/04/19 at  2:00 PM EDT by a video enabled telemedicine application and verified that I am speaking with the correct person using two identifiers.  I discussed the limitations of evaluation and management by telemedicine and the availability of in person appointments. The patient expressed understanding and agreed to proceed.  THIS ENCOUNTER IS A VIRTUAL VISIT DUE TO COVID-19 - PATIENT WAS NOT SEEN IN THE OFFICE. PATIENT HAS CONSENTED TO VIRTUAL VISIT / TELEMEDICINE VISIT USING [employer]ITY APP   Location of patient: home Location of provider: office Name of referring provider:  Tillman Abideichard Letvak Persons participating: myself, patient   HPI :  38 y/o male with a history of asthma, allergies, GERD, referred by Dr. Alphonsus SiasLetvak for abdominal pain.  For the past 2 weeks he has experienced some abdominal discomfort. Mostly centers in his upper abdomen, epigastric area. Does not usually radiate, perhaps to his back on occasion. Comes and goes - feels it most days of the week. Rated 2-3/10. Pain will last for a few hours and then goes away. He does have some pyrosis in his chest and throat at times - taking prilosec 20mg  / day. He thinks it controls the pyrosis for the most part. Heartburn does not appear much worse recently. No dysphagia No nausea or vomiting. Eating can sometimes precipitate pain, has had it at times, but not all the time and it can occur spontaneously. He initially had symptoms like this in December which is when he first took omprazole and it resolved at that time. Now taking omeprazole but has not gone away. No nocturnal symptoms, able to function. No weight loss. No trauma. No bowel changes. No blood in the stools. No NSAID use.   No FH of gastric or colon cancer.  Both parents have celiac disease. He thinks he was tested for it a very long time ago, no records on file. Mother also has Crohn's disease.    Past  Medical History:  Diagnosis Date  . Allergy   . Anxiety   . Asthma   . GERD (gastroesophageal reflux disease)      Past Surgical History:  Procedure Laterality Date  . ORCHIECTOMY     L orchiectomy and R orchiopexy for torsion   2002  . PILONIDAL CYST EXCISION  2000 & 2001   3 times   Family History  Problem Relation Age of Onset  . Migraines Mother   . Celiac disease Mother   . Crohn's disease Mother   . Migraines Father   . Celiac disease Father   . Cancer Neg Hx   . Colon cancer Neg Hx   . Esophageal cancer Neg Hx   . Pancreatic cancer Neg Hx   . Rectal cancer Neg Hx   . Stomach cancer Neg Hx    Social History   Tobacco Use  . Smoking status: Never Smoker  . Smokeless tobacco: Never Used  Substance Use Topics  . Alcohol use: Yes    Comment: occasional  . Drug use: No   Current Outpatient Medications  Medication Sig Dispense Refill  . omeprazole (PRILOSEC) 20 MG capsule Take 1 capsule (20 mg total) by mouth daily. 90 capsule 3   No current facility-administered medications for this visit.    No Known Allergies   Review of Systems: All systems reviewed and negative except where noted in HPI.   Lab Results  Component Value Date   WBC 8.7 07/16/2018  HGB 14.5 07/16/2018   HCT 42.7 07/16/2018   MCV 96.7 07/16/2018   PLT 249.0 07/16/2018    Lab Results  Component Value Date   CREATININE 1.18 07/16/2018   BUN 16 07/16/2018   NA 138 07/16/2018   K 4.2 07/16/2018   CL 102 07/16/2018   CO2 30 07/16/2018    Lab Results  Component Value Date   ALT 14 07/16/2018   AST 23 07/16/2018   ALKPHOS 52 07/16/2018   BILITOT 0.5 07/16/2018     Physical Exam: Ht 6\' 2"  (1.88 m) Comment: pt provided over the phone  Wt 270 lb (122.5 kg) Comment: pt provided over the phone  BMI 34.67 kg/m  NA   ASSESSMENT AND PLAN: 38 y/o male here for a new patient assessment of the following:  Epigastric pain / GERD - history as above, pyrosis mostly controlled  with omeprazole 20mg  but having some epigastric pain which has been bothering him recently. Similar episode in December responded to omeprazole as the time. He has a strong family history of celiac disease reportedly in both parents but he does not have any bowel symptoms. Discussed ddx with him to include GERD, PUD, H pylori, dyspepsia, biliary colic, pancreatic disorder, celiac disease, etc. Recommend initially a trial of higher dose omeprazole twice daily to see if that helps, take 30-60 minutes prior to a meal. Will also screen him for H pylori and celiac disease with serologies. If serologies negative and no response to higher dose PPI and symptoms persist, may consider [place] to assess for gallstones and or EGD to further evaluate. Discussed these possibilities with him, will await his course. I asked him to touch base with me in a few weeks and let me know how he is doing. He agreed with the plan.  Ileene Patrick, MD [facility]  CC: Karie Schwalbe, MD

## 2019-01-04 NOTE — Patient Instructions (Signed)
If you are age 38 or older, your body mass index should be between 23-30. Your Body mass index is 34.67 kg/m. If this is out of the aforementioned range listed, please consider follow up with your Primary Care Provider.  If you are age 86 or younger, your body mass index should be between 19-25. Your Body mass index is 34.67 kg/m. If this is out of the aformentioned range listed, please consider follow up with your Primary Care Provider.   To help prevent the possible spread of infection to our patients, communities, and staff; we will be implementing the following measures:  As of now we are not allowing any visitors/family members to accompany you to any upcoming appointments with [facility]. If you have any concerns about this please contact our office to discuss prior to the appointment.   Please go to the lab in the basement of our building to have lab work done. Hit "B" for basement when you get on the elevator.  When the doors open the lab is on your left.  We will call you with the results. Thank you.  Thank you for entrusting me with your care and for choosing [facility], Dr. Ileene Patrick

## 2019-01-11 ENCOUNTER — Other Ambulatory Visit (INDEPENDENT_AMBULATORY_CARE_PROVIDER_SITE_OTHER): Payer: BLUE CROSS/BLUE SHIELD

## 2019-01-11 DIAGNOSIS — K219 Gastro-esophageal reflux disease without esophagitis: Secondary | ICD-10-CM | POA: Diagnosis not present

## 2019-01-11 DIAGNOSIS — R1013 Epigastric pain: Secondary | ICD-10-CM | POA: Diagnosis not present

## 2019-01-11 LAB — H. PYLORI ANTIBODY, IGG: H Pylori IgG: NEGATIVE

## 2019-01-11 LAB — IGA: IgA: 313 mg/dL (ref 68–378)

## 2019-01-14 LAB — TISSUE TRANSGLUTAMINASE, IGA: (tTG) Ab, IgA: 2 U/mL

## 2019-01-24 DIAGNOSIS — G4733 Obstructive sleep apnea (adult) (pediatric): Secondary | ICD-10-CM | POA: Diagnosis not present

## 2019-01-24 DIAGNOSIS — R0602 Shortness of breath: Secondary | ICD-10-CM | POA: Diagnosis not present

## 2019-01-24 DIAGNOSIS — R002 Palpitations: Secondary | ICD-10-CM | POA: Diagnosis not present

## 2019-01-24 DIAGNOSIS — I208 Other forms of angina pectoris: Secondary | ICD-10-CM | POA: Diagnosis not present

## 2019-02-12 ENCOUNTER — Other Ambulatory Visit: Payer: Self-pay | Admitting: *Deleted

## 2019-02-12 ENCOUNTER — Telehealth: Payer: Self-pay | Admitting: *Deleted

## 2019-02-12 DIAGNOSIS — R1013 Epigastric pain: Secondary | ICD-10-CM

## 2019-02-12 NOTE — Telephone Encounter (Signed)
The patient has been scheduled for an abd [place] on 7/16 with an arrival time of 8:45 am. This was scheduled with the patient via [place]. The patient has been notified. The patient has been told to be NPO after midnight.

## 2019-02-21 ENCOUNTER — Other Ambulatory Visit: Payer: Self-pay

## 2019-02-21 ENCOUNTER — Ambulatory Visit (HOSPITAL_COMMUNITY)
Admission: RE | Admit: 2019-02-21 | Discharge: 2019-02-21 | Disposition: A | Discharge status: Home / Self Care | Payer: BC Managed Care – PPO | Source: Ambulatory Visit | Attending: Gastroenterology | Admitting: Gastroenterology

## 2019-02-21 DIAGNOSIS — R1013 Epigastric pain: Secondary | ICD-10-CM | POA: Diagnosis not present

## 2019-03-19 ENCOUNTER — Other Ambulatory Visit: Payer: Self-pay

## 2019-03-19 ENCOUNTER — Ambulatory Visit (INDEPENDENT_AMBULATORY_CARE_PROVIDER_SITE_OTHER): Payer: BC Managed Care – PPO | Admitting: Internal Medicine

## 2019-03-19 ENCOUNTER — Encounter: Payer: Self-pay | Admitting: Internal Medicine

## 2019-03-19 DIAGNOSIS — G4733 Obstructive sleep apnea (adult) (pediatric): Secondary | ICD-10-CM | POA: Diagnosis not present

## 2019-03-19 DIAGNOSIS — F419 Anxiety disorder, unspecified: Secondary | ICD-10-CM | POA: Diagnosis not present

## 2019-03-19 DIAGNOSIS — K219 Gastro-esophageal reflux disease without esophagitis: Secondary | ICD-10-CM | POA: Diagnosis not present

## 2019-03-19 DIAGNOSIS — Z0000 Encounter for general adult medical examination without abnormal findings: Secondary | ICD-10-CM | POA: Diagnosis not present

## 2019-03-19 NOTE — Assessment & Plan Note (Signed)
Healthy but let himself go Will work on fitness and better eating Recommended flu vaccine

## 2019-03-19 NOTE — Assessment & Plan Note (Signed)
Felt the CPAP made things worse

## 2019-03-19 NOTE — Patient Instructions (Signed)
How to help anxiety - without medication.   1) Regular Exercise - walking, jogging, cycling, dancing, strength training   2)  Begin a Mindfulness/Meditation practice -- this can take a little as 3 minutes and is helpful for all kinds of mood issues  -- You can find resources in books  -- Or you can download apps like  ---- Headspace App (which currently has free content called "Weathering the Storm")  ---- Calm (which has a few free options)  ---- Insignt Timer  ---- Stop, Breathe & Think   # With each of these Apps - you should decline the "start free trial" offer and as you search through the App should be able to access some of their free content. You can also chose to pay for the content if you find one that works well for you.   # Many of them also offer sleep specific content which may help with insomnia   3) Healthy Diet  -- Avoid or decrease Caffeine  -- Avoid or decrease Alcohol  -- Drink plenty of water, have a balanced diet  -- Avoid cigarettes and marijuana (as well as other recreational drugs)   4) Consider contacting a professional therapist   

## 2019-03-19 NOTE — Assessment & Plan Note (Signed)
Can give small amount of xanax if he needs

## 2019-03-19 NOTE — Progress Notes (Signed)
Subjective:    Patient ID: William Stuart, male    DOB: [birth year], 38 y.o.   MRN: [id number]  HPI Here for physical  Nothing came of his GI and cardiology evaluations H pylori was negative Gallbladder negative on ultrasound Omeprazole increased to bid His pain has improved Cardiologist didn't think this was cardiac Likely has basis in his chronic anxiety--has tried to avoid tranquilizers  Stress at [employer]work---awaiting students coming back, etc  Current Outpatient Medications on File Prior to Visit  Medication Sig Dispense Refill  . omeprazole (PRILOSEC) 20 MG capsule Take 1 capsule (20 mg total) by mouth daily. 90 capsule 3   No current facility-administered medications on file prior to visit.     No Known Allergies  Past Medical History:  Diagnosis Date  . Allergy   . Anxiety   . Asthma   . GERD (gastroesophageal reflux disease)     Past Surgical History:  Procedure Laterality Date  . ORCHIECTOMY     L orchiectomy and R orchiopexy for torsion   2002  . PILONIDAL CYST EXCISION  2000 & 2001   3 times    Family History  Problem Relation Age of Onset  . Migraines Mother   . Celiac disease Mother   . Crohn's disease Mother   . Migraines Father   . Celiac disease Father   . Cancer Neg Hx   . Colon cancer Neg Hx   . Esophageal cancer Neg Hx   . Pancreatic cancer Neg Hx   . Rectal cancer Neg Hx   . Stomach cancer Neg Hx     Social History   Socioeconomic History  . Marital status: Married    Spouse name: Not on file  . Number of children: 2  . Years of education: Not on file  . Highest education level: Not on file  Occupational History  . 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 Financial resource strain: Not on file  . Food insecurity    Worry: Not on file    Inability: Not on file  . Transportation needs    Medical: Not on file    Non-medical: Not on file  Tobacco Use  .  Smoking status: Never Smoker  . Smokeless tobacco: Never Used  Substance and Sexual Activity  . Alcohol use: Yes    Comment: occasional  . Drug use: No  . Sexual activity: Not on file  Lifestyle  . Physical activity    Days per week: Not on file    Minutes per session: Not on file  . Stress: Not on file  Relationships  . Social Musicianconnections    Talks on phone: Not on file    Gets together: Not on file    Attends religious service: Not on file    Active member of club or organization: Not on file    Attends meetings of clubs or organizations: Not on file    Relationship status: Not on file  . Intimate partner violence    Fear of current or ex partner: Not on file    Emotionally abused: Not on file    Physically abused: Not on file    Forced sexual activity: Not on file  Other Topics Concern  . Not on file  Social History Narrative  . Not on file   Review of Systems  Constitutional: Positive for unexpected weight change.  Weight is up Doesn't feel comfortable going back to [employer] some walking Wears seat belt  HENT: Negative for dental problem, hearing loss and tinnitus.        Keeps up with dentist  Eyes: Negative for visual disturbance.       No diplopia or unilateral vision loss  Respiratory: Negative for cough, chest tightness and shortness of breath.   Cardiovascular: Negative for chest pain and leg swelling.       Occ sense of heart racing or a skip---?anxiety related  Gastrointestinal: Negative for blood in stool and constipation.  Endocrine: Negative for polydipsia and polyuria.  Genitourinary: Negative for difficulty urinating and urgency.       No sexual problems  Musculoskeletal: Negative for arthralgias, back pain and joint swelling.  Skin: Negative for rash.       No suspicious lesions--has upper back lump  Allergic/Immunologic: Positive for environmental allergies. Negative for immunocompromised state.       Hasn't needed meds   Neurological: Negative for dizziness, syncope, light-headedness and headaches.  Hematological: Negative for adenopathy. Does not bruise/bleed easily.  Psychiatric/Behavioral: Negative for dysphoric mood and sleep disturbance. The patient is nervous/anxious.        Objective:   Physical Exam  Constitutional: He is oriented to person, place, and time. He appears well-developed. No distress.  HENT:  Head: Normocephalic and atraumatic.  Right Ear: External ear normal.  Left Ear: External ear normal.  Mouth/Throat: Oropharynx is clear and moist. No oropharyngeal exudate.  Eyes: Pupils are equal, round, and reactive to light. Conjunctivae are normal.  Neck: No thyromegaly present.  Cardiovascular: Normal rate, regular rhythm, normal heart sounds and intact distal pulses. Exam reveals no gallop.  No murmur heard. Respiratory: Effort normal and breath sounds normal. No respiratory distress. He has no wheezes. He has no rales.  GI: Soft. There is no abdominal tenderness.  Musculoskeletal:        General: No tenderness or edema.  Lymphadenopathy:    He has no cervical adenopathy.  Neurological: He is alert and oriented to person, place, and time.  Skin: No rash noted. No erythema.  Psychiatric: He has a normal mood and affect. His behavior is normal.           Assessment & Plan:

## 2019-03-19 NOTE — Assessment & Plan Note (Signed)
Presumably the cause of the stomach pain--which is finally better On bid PPI

## 2019-05-10 ENCOUNTER — Ambulatory Visit: Payer: BC Managed Care – PPO | Admitting: Internal Medicine

## 2019-05-10 ENCOUNTER — Encounter: Payer: Self-pay | Admitting: Internal Medicine

## 2019-05-10 ENCOUNTER — Other Ambulatory Visit: Payer: Self-pay

## 2019-05-10 VITALS — BP 110/70 | HR 83 | Temp 98.3°F | Ht 74.0 in | Wt 287.0 lb

## 2019-05-10 DIAGNOSIS — M791 Myalgia, unspecified site: Secondary | ICD-10-CM

## 2019-05-10 DIAGNOSIS — R5383 Other fatigue: Secondary | ICD-10-CM | POA: Diagnosis not present

## 2019-05-10 LAB — COMPREHENSIVE METABOLIC PANEL
ALT: 30 U/L (ref 0–53)
AST: 25 U/L (ref 0–37)
Albumin: 4.6 g/dL (ref 3.5–5.2)
Alkaline Phosphatase: 63 U/L (ref 39–117)
BUN: 17 mg/dL (ref 6–23)
CO2: 26 mEq/L (ref 19–32)
Calcium: 9.8 mg/dL (ref 8.4–10.5)
Chloride: 101 mEq/L (ref 96–112)
Creatinine, Ser: 1.09 mg/dL (ref 0.40–1.50)
GFR: 75.79 mL/min (ref >60.00)
Glucose, Bld: 83 mg/dL (ref 70–99)
Potassium: 4.7 mEq/L (ref 3.5–5.1)
Sodium: 136 mEq/L (ref 135–145)
Total Bilirubin: 0.7 mg/dL (ref 0.2–1.2)
Total Protein: 7.9 g/dL (ref 6.0–8.3)

## 2019-05-10 LAB — T4, FREE: Free T4: 0.91 ng/dL (ref 0.60–1.60)

## 2019-05-10 LAB — CBC
HCT: 44 % (ref 39.0–52.0)
Hemoglobin: 14.9 g/dL (ref 13.0–17.0)
MCHC: 33.9 g/dL (ref 30.0–36.0)
MCV: 96.8 fl (ref 78.0–100.0)
Platelets: 230 10*3/uL (ref 150.0–400.0)
RBC: 4.54 Mil/uL (ref 4.22–5.81)
RDW: 12.3 % (ref 11.5–15.5)
WBC: 6.2 10*3/uL (ref 4.0–10.5)

## 2019-05-10 LAB — CK: Total CK: 111 U/L (ref 7–232)

## 2019-05-10 LAB — SEDIMENTATION RATE: Sed Rate: 26 mm/hr — ABNORMAL HIGH (ref 0–15)

## 2019-05-10 NOTE — Progress Notes (Signed)
Subjective:    Patient ID: William Stuart, male    DOB: [birth year], 38 y.o.   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 Some sense a couple of nights ago No known COVID exposure  Current Outpatient Medications on File Prior to Visit  Medication Sig Dispense Refill  . omeprazole (PRILOSEC) 20 MG capsule Take 1 capsule (20 mg total) by mouth daily. 90 capsule 3   No current facility-administered medications on file prior to visit.     No Known Allergies  Past Medical History:  Diagnosis Date  . Allergy   . Anxiety   . Asthma   . GERD (gastroesophageal reflux disease)     Past Surgical History:  Procedure Laterality Date  . ORCHIECTOMY     L orchiectomy and R orchiopexy for torsion   2002  . PILONIDAL CYST EXCISION  2000 & 2001   3 times    Family History  Problem Relation Age of Onset  . Migraines Mother   . Celiac disease Mother   . Crohn's disease Mother   . Migraines Father   . Celiac disease Father   . Cancer Neg Hx   . Colon cancer Neg Hx   . Esophageal cancer Neg Hx   . Pancreatic cancer Neg Hx   . Rectal cancer Neg Hx   . Stomach cancer Neg Hx     Social History   Socioeconomic History  . Marital status: Married    Spouse name: Not on file  . Number of children: 2  . Years of education: Not on file  . Highest education level: Not on file  Occupational History  . 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 Financial resource strain: Not on file  . Food insecurity    Worry: Not on file    Inability: Not on file  . Transportation needs    Medical: Not  on file    Non-medical: Not on file  Tobacco Use  . Smoking status: Never Smoker  . Smokeless tobacco: Never Used  Substance and Sexual Activity  . Alcohol use: Yes    Comment: occasional  . Drug use: No  . Sexual activity: Not on file  Lifestyle  . Physical activity    Days per week: Not on file    Minutes per session: Not on file  . Stress: Not on file  Relationships  . Social Musician on phone: Not on file    Gets together: Not on file    Attends religious service: Not on file    Active member of club or organization: Not on file    Attends meetings of clubs or organizations: Not on file    Relationship status: Not on file  . Intimate partner violence    Fear of current or ex partner: Not on file    Emotionally abused: Not on file    Physically abused: Not on file    Forced sexual activity: Not on file  Other Topics Concern  . Not on file  Social History Narrative  .  Not on file   Review of Systems Trying to go to bed earlier and get more sleep--usually awakens after 2 hours though Will awaken tired Appetite is okay No N/V/diarrhea Has been working out and trying to improve his diet---starting to have his weight go down No rash    Objective:   Physical Exam  Constitutional: He is oriented to person, place, and time. He appears well-developed. No distress.  HENT:  Mouth/Throat: Oropharynx is clear and moist. No oropharyngeal exudate.  No oral lesions  Eyes: Conjunctivae are normal.  Neck: No thyromegaly present.  Cardiovascular: Normal rate, regular rhythm and normal heart sounds. Exam reveals no gallop.  No murmur heard. Respiratory: Effort normal and breath sounds normal. No respiratory distress. He has no wheezes. He has no rales.  GI: Soft. He exhibits no distension and no mass. There is no abdominal tenderness. There is no rebound and no guarding.  No apparent HSM  Musculoskeletal:        General: No tenderness or edema.  Lymphadenopathy:     He has no cervical adenopathy.    He has no axillary adenopathy.       Right: No inguinal adenopathy present.       Left: No inguinal adenopathy present.  Neurological: He is alert and oriented to person, place, and time.  Skin: No rash noted.  Psychiatric: He has a normal mood and affect.           Assessment & Plan:

## 2019-05-10 NOTE — Assessment & Plan Note (Signed)
For about a week No obvious infection but possible mosquito borne illness???? No worrisome findings Will check labs

## 2019-05-10 NOTE — Assessment & Plan Note (Signed)
Unclear if some infection Nothing on exam No joint findings Will just check labs Discussed adequate sleep, continuing exercise, etc

## 2019-05-29 ENCOUNTER — Ambulatory Visit: Payer: Self-pay

## 2019-05-29 ENCOUNTER — Other Ambulatory Visit: Payer: Self-pay

## 2019-05-29 DIAGNOSIS — Z23 Encounter for immunization: Secondary | ICD-10-CM

## 2019-06-27 DIAGNOSIS — G4733 Obstructive sleep apnea (adult) (pediatric): Secondary | ICD-10-CM | POA: Diagnosis not present

## 2019-06-27 DIAGNOSIS — R002 Palpitations: Secondary | ICD-10-CM | POA: Diagnosis not present

## 2019-06-27 DIAGNOSIS — R0602 Shortness of breath: Secondary | ICD-10-CM | POA: Diagnosis not present

## 2019-06-27 DIAGNOSIS — R079 Chest pain, unspecified: Secondary | ICD-10-CM | POA: Diagnosis not present

## 2019-07-17 ENCOUNTER — Other Ambulatory Visit: Payer: Self-pay

## 2019-07-17 ENCOUNTER — Encounter: Payer: Self-pay | Admitting: Medical

## 2019-07-17 ENCOUNTER — Telehealth: Payer: Self-pay | Admitting: Medical

## 2019-07-17 DIAGNOSIS — R21 Rash and other nonspecific skin eruption: Secondary | ICD-10-CM

## 2019-07-17 DIAGNOSIS — L309 Dermatitis, unspecified: Secondary | ICD-10-CM

## 2019-07-17 NOTE — Progress Notes (Signed)
   Patient gives permisson to treat through telemedicine. 38 yo male in non acute distress. Patient called about rash on his body. He has a rash on the left elbow "I have had it a while". But noticed earlier today other areas on body with rash as well. Denies itching or pain, no fever or chills. No erythema per patient, no discharge.William Stuart  He does have a history of Asthma, that is currently under control.  I recommended  OTC 1%  hydrocotisone cream to the left elbow twice daily x 7 days. And I recommended he needed physical exam for rash on the back of hand(s) and his lower legs. Explained it could be the same thing, or something else, difficult to tell in the pictures he sent me by email. He could not come in today. He can come in tomorrow. Explained he will be seeing my colleague [facility] FNP  tomorrow. He is able to come in at 10 am tomorrow for further evaluation.  Patient verbalizes understanding and has no questions at the end of our conversation.

## 2019-07-17 NOTE — Patient Instructions (Signed)

## 2019-07-18 ENCOUNTER — Encounter: Payer: Self-pay | Admitting: Nurse Practitioner

## 2019-07-18 ENCOUNTER — Ambulatory Visit: Payer: Self-pay | Admitting: Nurse Practitioner

## 2019-07-18 ENCOUNTER — Other Ambulatory Visit: Payer: Self-pay

## 2019-07-18 VITALS — BP 131/77 | HR 82 | Temp 98.0°F | Resp 18 | Ht 74.0 in | Wt 298.0 lb

## 2019-07-18 DIAGNOSIS — L853 Xerosis cutis: Secondary | ICD-10-CM

## 2019-07-18 NOTE — Progress Notes (Signed)
   Subjective:    Patient ID: William Stuart, male    DOB: [birth year], 38 y.o.   MRN: [id number]  HPI William Stuart is here today with c/o of excessive dry skin or flaking that has been going on for some weeks to his elbows. He also reports other areas of dryness/scaling for the last 3 days to include his face and knees. He has not tried anything over the counter or used lotion. He denies any fever, anyone in house with rash, animals in home recent travel, new detergents, foods, or clothes. Denies any itching, burning or discomfort to the skin. He does report a hx of Asthma which he hasn't used his inhaler in years/nor has he had a flare up. Thinking his inhaler may be expired due to the last occurrence of needing it.    Review of Systems  Constitutional: Negative for fever.  Skin: Positive for rash.       Objective:   Physical Exam Constitutional:      Appearance: Normal appearance. He is obese.  HENT:     Head: Normocephalic and atraumatic.  Musculoskeletal:     Cervical back: Normal range of motion and neck supple.  Skin:    General: Skin is warm and dry.     Comments: Bilateral elbows with ashen scaly papular in nature with no erythema, edema note. Rest of skin dry appearing and signs of itching to knees from erythematous streaks that resemble finger marks. No hypo or hyperpigmented areas.  Neurological:     General: No focal deficit present.     Mental Status: He is alert and oriented to person, place, and time.  Psychiatric:        Mood and Affect: Mood normal.           Assessment & Plan:

## 2019-07-18 NOTE — Patient Instructions (Signed)
Khyree please start Eucerin cream and apply to all affected areas as much as you need to You can use some hydrocortisone cream to your elbows as my colleague instructed twice daily Please increase your fluid intake Encouraged patient to call the office or primary care doctor for an appointment if no improvement in symptoms or if symptoms change or worsen after 72 hours of planned treatment. Patient verbalized understanding of all instructions given/reviewed and has no further questions or concerns at this time.

## 2019-08-06 DIAGNOSIS — L728 Other follicular cysts of the skin and subcutaneous tissue: Secondary | ICD-10-CM | POA: Diagnosis not present

## 2019-08-22 DIAGNOSIS — R0789 Other chest pain: Secondary | ICD-10-CM | POA: Diagnosis not present

## 2019-08-22 DIAGNOSIS — R002 Palpitations: Secondary | ICD-10-CM | POA: Diagnosis not present

## 2019-08-22 DIAGNOSIS — G4733 Obstructive sleep apnea (adult) (pediatric): Secondary | ICD-10-CM | POA: Diagnosis not present

## 2019-08-22 DIAGNOSIS — R000 Tachycardia, unspecified: Secondary | ICD-10-CM | POA: Diagnosis not present

## 2019-08-23 DIAGNOSIS — R0789 Other chest pain: Secondary | ICD-10-CM | POA: Insufficient documentation

## 2019-11-05 MED ORDER — OMEPRAZOLE 20 MG PO CPDR: 20.0000 mg | DELAYED_RELEASE_CAPSULE | Freq: Every day | ORAL | 3 refills | Status: DC

## 2019-12-20 ENCOUNTER — Ambulatory Visit (INDEPENDENT_AMBULATORY_CARE_PROVIDER_SITE_OTHER): Payer: BC Managed Care – PPO | Admitting: Internal Medicine

## 2019-12-20 ENCOUNTER — Encounter: Payer: Self-pay | Admitting: Internal Medicine

## 2019-12-20 ENCOUNTER — Other Ambulatory Visit: Payer: Self-pay

## 2019-12-20 DIAGNOSIS — F419 Anxiety disorder, unspecified: Secondary | ICD-10-CM | POA: Diagnosis not present

## 2019-12-20 DIAGNOSIS — R682 Dry mouth, unspecified: Secondary | ICD-10-CM | POA: Diagnosis not present

## 2019-12-20 LAB — COMPREHENSIVE METABOLIC PANEL
ALT: 30 U/L (ref 0–53)
AST: 29 U/L (ref 0–37)
Albumin: 4.2 g/dL (ref 3.5–5.2)
Alkaline Phosphatase: 60 U/L (ref 39–117)
BUN: 13 mg/dL (ref 6–23)
CO2: 29 mEq/L (ref 19–32)
Calcium: 9.2 mg/dL (ref 8.4–10.5)
Chloride: 103 mEq/L (ref 96–112)
Creatinine, Ser: 1 mg/dL (ref 0.40–1.50)
GFR: 83.44 mL/min (ref >60.00)
Glucose, Bld: 89 mg/dL (ref 70–99)
Potassium: 4.2 mEq/L (ref 3.5–5.1)
Sodium: 134 mEq/L — ABNORMAL LOW (ref 135–145)
Total Bilirubin: 0.5 mg/dL (ref 0.2–1.2)
Total Protein: 7.6 g/dL (ref 6.0–8.3)

## 2019-12-20 LAB — CBC
HCT: 37.7 % — ABNORMAL LOW (ref 39.0–52.0)
Hemoglobin: 12.8 g/dL — ABNORMAL LOW (ref 13.0–17.0)
MCHC: 34 g/dL (ref 30.0–36.0)
MCV: 93.7 fl (ref 78.0–100.0)
Platelets: 224 10*3/uL (ref 150.0–400.0)
RBC: 4.02 Mil/uL — ABNORMAL LOW (ref 4.22–5.81)
RDW: 12.5 % (ref 11.5–15.5)
WBC: 6.2 10*3/uL (ref 4.0–10.5)

## 2019-12-20 LAB — T4, FREE: Free T4: 1.04 ng/dL (ref 0.60–1.60)

## 2019-12-20 NOTE — Progress Notes (Signed)
Subjective:    Patient ID: William Stuart, male    DOB: 07/05/1981, 39 y.o.   MRN: [id number]  HPI Here due to worsening dry skin This visit occurred during the SARS-CoV-2 public health emergency.  Safety protocols were in place, including screening questions prior to the visit, additional usage of staff PPE, and extensive cleaning of exam room while observing appropriate contact time as indicated for disinfecting solutions.   Dry, scaly skin on arms and legs Decreased sweat when working in the yard---skin feels funny Recently having some dryness in mouth also Has worked hard on hydration--drinking a lot more water This hasn't helped  Has had more alcohol--worried it might affect his liver  Diagnosed with eczema at occupational clinic Left elbow particularly bad---the cream did help that  No new medications Diet is mostly unchanged No alcohol in the past week  Current Outpatient Medications on File Prior to Visit  Medication Sig Dispense Refill  . metoprolol succinate (TOPROL-XL) 25 MG 24 hr tablet Take by mouth.    Marland Kitchen omeprazole (PRILOSEC) 20 MG capsule Take 1 capsule (20 mg total) by mouth daily. 90 capsule 3   No current facility-administered medications on file prior to visit.    No Known Allergies  Past Medical History:  Diagnosis Date  . Allergy   . Anxiety   . Asthma   . GERD (gastroesophageal reflux disease)     Past Surgical History:  Procedure Laterality Date  . ORCHIECTOMY     L orchiectomy and R orchiopexy for torsion   2002  . PILONIDAL CYST EXCISION  2000 & 2001   3 times    Family History  Problem Relation Age of Onset  . Migraines Mother   . Celiac disease Mother   . Crohn's disease Mother   . Migraines Father   . Celiac disease Father   . Cancer Neg Hx   . Colon cancer Neg Hx   . Esophageal cancer Neg Hx   . Pancreatic cancer Neg Hx   . Rectal cancer Neg Hx   . Stomach cancer Neg Hx     Social History   Socioeconomic History  .  Marital status: Married    Spouse name: Not on file  . Number of children: 2  . Years of education: Not on file  . Highest education level: Not on file  Occupational History  . 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 Smoking status: Never Smoker  . Smokeless tobacco: Never Used  Substance and Sexual Activity  . Alcohol use: Yes    Comment: occasional  . Drug use: No  . Sexual activity: Not on file  Other Topics Concern  . Not on file  Social History Narrative  . Not on file   Social Determinants of Health   Financial Resource Strain:   . Difficulty of Paying Living Expenses:   Food Insecurity:   . Worried About Charity fundraiser in the Last Year:   . Arboriculturist in the Last Year:   Transportation Needs:   . Film/video editor (Medical):   Marland Kitchen Lack of Transportation (Non-Medical):   Physical Activity:   . Days of Exercise per Week:   . Minutes of Exercise per Session:   Stress:   . Feeling of Stress :   Social Connections:   . Frequency of Communication with Friends and Family:   . Frequency of Social  Gatherings with Friends and Family:   . Attends Religious Services:   . Active Member of Clubs or Organizations:   . Attends Banker Meetings:   Marland Kitchen Marital Status:   Intimate Partner Violence:   . Fear of Current or Ex-Partner:   . Emotionally Abused:   Marland Kitchen Physically Abused:   . Sexually Abused:    Review of Systems Increased urination from the increased water Weight fairly stable No chest pain or SOB Normal bowels No joint swelling or pain    Objective:   Physical Exam  HENT:  Mouth/Throat: Oropharynx is clear and moist. No oropharyngeal exudate.  No oral lesions and doesn't look dry  Neck: No thyromegaly present.  Cardiovascular: Normal rate, regular rhythm and normal heart sounds. Exam reveals no gallop.  No murmur heard. Respiratory: Effort normal and breath sounds  normal. No respiratory distress. He has no wheezes. He has no rales.  GI: Soft. There is no abdominal tenderness.  Musculoskeletal:        General: No tenderness or edema.  Lymphadenopathy:    He has no cervical adenopathy.  Psychiatric: He has a normal mood and affect. His behavior is normal.           Assessment & Plan:

## 2019-12-20 NOTE — Assessment & Plan Note (Signed)
Sensation but doesn't look dry Mild skin changes but nothing that looks eczematous now I doubt diabetes or thyroid disease---but will recheck his labs Might be mild symptoms that then provoke his anxiety

## 2019-12-20 NOTE — Assessment & Plan Note (Signed)
"  ever present" as he states May have some degree of somatization now---but no clear reason for medication now

## 2019-12-22 ENCOUNTER — Other Ambulatory Visit: Payer: Self-pay | Admitting: Internal Medicine

## 2019-12-22 DIAGNOSIS — D649 Anemia, unspecified: Secondary | ICD-10-CM

## 2019-12-25 ENCOUNTER — Other Ambulatory Visit (INDEPENDENT_AMBULATORY_CARE_PROVIDER_SITE_OTHER): Payer: BC Managed Care – PPO

## 2019-12-25 ENCOUNTER — Other Ambulatory Visit: Payer: Self-pay

## 2019-12-25 DIAGNOSIS — D649 Anemia, unspecified: Secondary | ICD-10-CM

## 2019-12-25 LAB — CBC WITH DIFFERENTIAL/PLATELET
Basophils Absolute: 0.1 10*3/uL (ref 0.0–0.1)
Basophils Relative: 0.8 % (ref 0.0–3.0)
Eosinophils Absolute: 0.1 10*3/uL (ref 0.0–0.7)
Eosinophils Relative: 1.4 % (ref 0.0–5.0)
HCT: 39 % (ref 39.0–52.0)
Hemoglobin: 13.3 g/dL (ref 13.0–17.0)
Lymphocytes Relative: 26.4 % (ref 12.0–46.0)
Lymphs Abs: 1.9 10*3/uL (ref 0.7–4.0)
MCHC: 34.2 g/dL (ref 30.0–36.0)
MCV: 93.9 fl (ref 78.0–100.0)
Monocytes Absolute: 0.7 10*3/uL (ref 0.1–1.0)
Monocytes Relative: 9.1 % (ref 3.0–12.0)
Neutro Abs: 4.5 10*3/uL (ref 1.4–7.7)
Neutrophils Relative %: 62.3 % (ref 43.0–77.0)
Platelets: 245 10*3/uL (ref 150.0–400.0)
RBC: 4.15 Mil/uL — ABNORMAL LOW (ref 4.22–5.81)
RDW: 12.9 % (ref 11.5–15.5)
WBC: 7.2 10*3/uL (ref 4.0–10.5)

## 2019-12-25 LAB — IBC PANEL
Iron: 43 ug/dL (ref 42–165)
Saturation Ratios: 8.8 % — ABNORMAL LOW (ref 20.0–50.0)
Transferrin: 349 mg/dL (ref 212.0–360.0)

## 2019-12-25 LAB — FERRITIN: Ferritin: 12.2 ng/mL — ABNORMAL LOW (ref 22.0–322.0)

## 2019-12-25 LAB — SEDIMENTATION RATE: Sed Rate: 24 mm/hr — ABNORMAL HIGH (ref 0–15)

## 2019-12-25 LAB — B12 AND FOLATE PANEL
Folate: 12.1 ng/mL (ref >5.9)
Vitamin B-12: 337 pg/mL (ref 211–911)

## 2020-02-17 NOTE — Telephone Encounter (Signed)
Can you please get him in this week to discuss the anxiety---then reschedule the PE for later (he said he couldn't get through)

## 2020-02-19 NOTE — Telephone Encounter (Signed)
Pt is scheduled for an appointment on 7/28. He cancelled his physical and we will reschedule when he comes in to see Dr. Alphonsus Sias on the 28th.

## 2020-02-25 ENCOUNTER — Ambulatory Visit: Payer: Self-pay | Admitting: Nurse Practitioner

## 2020-02-25 DIAGNOSIS — R002 Palpitations: Secondary | ICD-10-CM | POA: Diagnosis not present

## 2020-02-25 DIAGNOSIS — G4733 Obstructive sleep apnea (adult) (pediatric): Secondary | ICD-10-CM | POA: Diagnosis not present

## 2020-02-25 DIAGNOSIS — E6609 Other obesity due to excess calories: Secondary | ICD-10-CM | POA: Diagnosis not present

## 2020-02-25 DIAGNOSIS — R0789 Other chest pain: Secondary | ICD-10-CM | POA: Diagnosis not present

## 2020-03-04 ENCOUNTER — Encounter: Payer: Self-pay | Admitting: Internal Medicine

## 2020-03-04 ENCOUNTER — Ambulatory Visit: Payer: BC Managed Care – PPO | Admitting: Internal Medicine

## 2020-03-04 ENCOUNTER — Other Ambulatory Visit: Payer: Self-pay

## 2020-03-04 VITALS — BP 120/84 | HR 78 | Temp 97.0°F | Ht 74.0 in | Wt 292.0 lb

## 2020-03-04 DIAGNOSIS — Z23 Encounter for immunization: Secondary | ICD-10-CM

## 2020-03-04 DIAGNOSIS — Z20820 Contact with and (suspected) exposure to varicella: Secondary | ICD-10-CM | POA: Diagnosis not present

## 2020-03-04 DIAGNOSIS — Z209 Contact with and (suspected) exposure to unspecified communicable disease: Secondary | ICD-10-CM

## 2020-03-04 DIAGNOSIS — Z111 Encounter for screening for respiratory tuberculosis: Secondary | ICD-10-CM | POA: Diagnosis not present

## 2020-03-04 DIAGNOSIS — F411 Generalized anxiety disorder: Secondary | ICD-10-CM | POA: Diagnosis not present

## 2020-03-04 MED ORDER — FLUOXETINE HCL 20 MG PO CAPS: 20.0000 mg | ORAL_CAPSULE | Freq: Every day | ORAL | 3 refills | Status: DC

## 2020-03-04 NOTE — Assessment & Plan Note (Signed)
Having increased and daily symptoms Discussed alternatives Will try fluoxetine (hopefully will help with weight loss efforts also) If doesn't tolerate, will try duloxetine

## 2020-03-04 NOTE — Progress Notes (Signed)
Subjective:    Patient ID: William Stuart, male    DOB: 07-30-1981, 39 y.o.   MRN: [id number]  HPI Here due to worsening anxiety This visit occurred during the SARS-CoV-2 public health emergency.  Safety protocols were in place, including screening questions prior to the visit, additional usage of staff PPE, and extensive cleaning of exam room while observing appropriate contact time as indicated for disinfecting solutions.   Anxiety has always been there Recently went to beach with friends---"miserable time for me"---couldn't relax, etc Stress with job--changing nature of his position, starting new program (2 years at least)  Feels "run down" Stuff "running through my head all the time" Hard to "detach" No sadness or depression Anxious about his health--in vague way Is having some symptoms every day  Current Outpatient Medications on File Prior to Visit  Medication Sig Dispense Refill  . metoprolol succinate (TOPROL-XL) 25 MG 24 hr tablet Take by mouth.    Marland Kitchen omeprazole (PRILOSEC) 20 MG capsule Take 1 capsule (20 mg total) by mouth daily. 90 capsule 3   No current facility-administered medications on file prior to visit.    No Known Allergies  Past Medical History:  Diagnosis Date  . Allergy   . Anxiety   . Asthma   . GERD (gastroesophageal reflux disease)     Past Surgical History:  Procedure Laterality Date  . ORCHIECTOMY     L orchiectomy and R orchiopexy for torsion   2002  . PILONIDAL CYST EXCISION  2000 & 2001   3 times    Family History  Problem Relation Age of Onset  . Migraines Mother   . Celiac disease Mother   . Crohn's disease Mother   . Migraines Father   . Celiac disease Father   . Cancer Neg Hx   . Colon cancer Neg Hx   . Esophageal cancer Neg Hx   . Pancreatic cancer Neg Hx   . Rectal cancer Neg Hx   . Stomach cancer Neg Hx     Social History   Socioeconomic History  . Marital status: Married    Spouse name: Not on file  . Number of  children: 2  . Years of education: Not on file  . Highest education level: Not on file  Occupational History  . 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 Smoking status: Never Smoker  . Smokeless tobacco: Never Used  Vaping Use  . Vaping Use: Never used  Substance and Sexual Activity  . Alcohol use: Yes    Comment: occasional  . Drug use: No  . Sexual activity: Not on file  Other Topics Concern  . Not on file  Social History Narrative  . Not on file   Social Determinants of Health   Financial Resource Strain:   . Difficulty of Paying Living Expenses:   Food Insecurity:   . Worried About Programme researcher, broadcasting/film/video in the Last Year:   . Barista in the Last Year:   Transportation Needs:   . Freight forwarder (Medical):   Marland Kitchen Lack of Transportation (Non-Medical):   Physical Activity:   . Days of Exercise per Week:   . Minutes of Exercise per Session:   Stress:   . Feeling of Stress :   Social Connections:   . Frequency of Communication with Friends and Family:   . Frequency of Social Gatherings with Friends and Family:   .  Attends Religious Services:   . Active Member of Clubs or Organizations:   . Attends Banker Meetings:   Marland Kitchen Marital Status:   Intimate Partner Violence:   . Fear of Current or Ex-Partner:   . Emotionally Abused:   Marland Kitchen Physically Abused:   . Sexually Abused:    Review of Systems Not sleeping great---not new but may be worse now (increased trouble reinitiating after going to the bathroom, etc) Trying to eat healthier--has lost some weight in the past few weeks due to this    Objective:   Physical Exam Constitutional:      Appearance: Normal appearance.  Neurological:     Mental Status: He is alert.  Psychiatric:        Mood and Affect: Mood normal.        Behavior: Behavior normal.            Assessment & Plan:

## 2020-03-04 NOTE — Addendum Note (Signed)
Addended by: Eual Fines on: 03/04/2020 09:36 AM   Modules accepted: Orders

## 2020-03-04 NOTE — Patient Instructions (Signed)
Please start the fluoxetine with 1 capsule every other day for 2 weeks. If you have no problems with it, increase to a capsule every day at that point. Let me know if you have any problems.

## 2020-03-05 LAB — VARICELLA ZOSTER ANTIBODY, IGG: Varicella IgG: 994.2 index

## 2020-03-06 LAB — TB SKIN TEST
Induration: 0 mm
TB Skin Test: NEGATIVE

## 2020-03-20 ENCOUNTER — Encounter: Payer: BC Managed Care – PPO | Admitting: Internal Medicine

## 2020-05-06 ENCOUNTER — Ambulatory Visit: Payer: BC Managed Care – PPO | Admitting: Internal Medicine

## 2020-05-06 ENCOUNTER — Other Ambulatory Visit: Payer: Self-pay

## 2020-05-06 ENCOUNTER — Encounter: Payer: Self-pay | Admitting: Internal Medicine

## 2020-05-06 VITALS — BP 112/84 | HR 75 | Temp 97.6°F | Ht 74.0 in | Wt 284.0 lb

## 2020-05-06 DIAGNOSIS — F411 Generalized anxiety disorder: Secondary | ICD-10-CM

## 2020-05-06 DIAGNOSIS — E611 Iron deficiency: Secondary | ICD-10-CM

## 2020-05-06 NOTE — Progress Notes (Signed)
Subjective:    Patient ID: William Stuart, male    DOB: [birth year], 39 y.o.   MRN: [id number]  HPI Here for follow up of anxiety This visit occurred during the SARS-CoV-2 public health emergency.  Safety protocols were in place, including screening questions prior to the visit, additional usage of staff PPE, and extensive cleaning of exam room while observing appropriate contact time as indicated for disinfecting solutions.   No problems with the fluoxetine Hard to tell if it is helping his anxiety His mom thinks he is better Nothing clear from his wife No longer getting anxious before meetings Still worries about health things--but can move past that easier  Current Outpatient Medications on File Prior to Visit  Medication Sig Dispense Refill  . FLUoxetine (PROZAC) 20 MG capsule Take 1 capsule (20 mg total) by mouth daily. 90 capsule 3  . metoprolol succinate (TOPROL-XL) 25 MG 24 hr tablet Take by mouth.    Marland Kitchen omeprazole (PRILOSEC) 20 MG capsule Take 1 capsule (20 mg total) by mouth daily. 90 capsule 3   No current facility-administered medications on file prior to visit.    No Known Allergies  Past Medical History:  Diagnosis Date  . Allergy   . Anxiety   . Asthma   . GERD (gastroesophageal reflux disease)     Past Surgical History:  Procedure Laterality Date  . ORCHIECTOMY     L orchiectomy and R orchiopexy for torsion   2002  . PILONIDAL CYST EXCISION  2000 & 2001   3 times    Family History  Problem Relation Age of Onset  . Migraines Mother   . Celiac disease Mother   . Crohn's disease Mother   . Migraines Father   . Celiac disease Father   . Cancer Neg Hx   . Colon cancer Neg Hx   . Esophageal cancer Neg Hx   . Pancreatic cancer Neg Hx   . Rectal cancer Neg Hx   . Stomach cancer Neg Hx     Social History   Socioeconomic History  . Marital status: Married    Spouse name: Not on file  . Number of children: 2  . Years of education: Not on file  .  Highest education level: Not on file  Occupational History  . 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 Smoking status: Never Smoker  . Smokeless tobacco: Never Used  Vaping Use  . Vaping Use: Never used  Substance and Sexual Activity  . Alcohol use: Yes    Comment: occasional  . Drug use: No  . Sexual activity: Not on file  Other Topics Concern  . Not on file  Social History Narrative  . Not on file   Social Determinants of Health   Financial Resource Strain:   . Difficulty of Paying Living Expenses: Not on file  Food Insecurity:   . Worried About Programme researcher, broadcasting/film/video in the Last Year: Not on file  . Ran Out of Food in the Last Year: Not on file  Transportation Needs:   . Lack of Transportation (Medical): Not on file  . Lack of Transportation (Non-Medical): Not on file  Physical Activity:   . Days of Exercise per Week: Not on file  . Minutes of Exercise per Session: Not on file  Stress:   . Feeling of Stress : Not on file  Social Connections:   . Frequency of Communication with Friends  and Family: Not on file  . Frequency of Social Gatherings with Friends and Family: Not on file  . Attends Religious Services: Not on file  . Active Member of Clubs or Organizations: Not on file  . Attends Banker Meetings: Not on file  . Marital Status: Not on file  Intimate Partner Violence:   . Fear of Current or Ex-Partner: Not on file  . Emotionally Abused: Not on file  . Physically Abused: Not on file  . Sexually Abused: Not on file   Review of Systems  Appetite is stable Has lost a few pounds Is walking regularly He sweats a lot--more than he remembers    Objective:   Physical Exam Constitutional:      Appearance: Normal appearance.  Neurological:     Mental Status: He is alert.  Psychiatric:        Mood and Affect: Mood normal.        Behavior: Behavior normal.            Assessment  & Plan:

## 2020-05-06 NOTE — Assessment & Plan Note (Signed)
Doesn't have overt improvement---but does notice the lack or previous intrusive anxious thoughts Will continue the fluoxetine

## 2020-05-08 ENCOUNTER — Other Ambulatory Visit (INDEPENDENT_AMBULATORY_CARE_PROVIDER_SITE_OTHER): Payer: BC Managed Care – PPO

## 2020-05-08 DIAGNOSIS — E611 Iron deficiency: Secondary | ICD-10-CM | POA: Diagnosis not present

## 2020-05-08 LAB — FECAL OCCULT BLOOD, IMMUNOCHEMICAL: Fecal Occult Bld: NEGATIVE

## 2020-08-05 ENCOUNTER — Ambulatory Visit
Admission: EM | Admit: 2020-08-05 | Discharge: 2020-08-05 | Disposition: A | Discharge status: Home / Self Care | Payer: BC Managed Care – PPO | Attending: Family Medicine | Admitting: Family Medicine

## 2020-08-05 ENCOUNTER — Other Ambulatory Visit: Payer: Self-pay

## 2020-08-05 DIAGNOSIS — R000 Tachycardia, unspecified: Secondary | ICD-10-CM

## 2020-08-05 NOTE — ED Provider Notes (Signed)
William Stuart    CSN: [id number] Arrival date & time: 08/05/20  1324      History   Chief Complaint Chief Complaint  Patient presents with   Tachycardia    HPI William Stuart is a 39 y.o. male.   Patient is a 39 year old male with past medical history of allergy, anxiety, asthma, GERD.  He presents today due to episode of tachycardia this morning.  Upon waking this morning he felt like his heart was racing and felt diaphoretic.  Had some generalized shakiness.  This is since resolved.  Feels like his heart rate is more increased than normal.  Denies any current chest pain or shortness of breath.  Has seen cardiology in the past and is currently taking metoprolol daily.  Reporting heart rate normally ranges between 70 and 80 resting.  No recent viral type symptoms. Has had stress testing within the past year or so.      Past Medical History:  Diagnosis Date   Allergy    Anxiety    Asthma    GERD (gastroesophageal reflux disease)     Patient Active Problem List   Diagnosis Date Noted   Dry mouth, unspecified 12/20/2019   Preventative health care 03/19/2019   GERD (gastroesophageal reflux disease) 02/14/2018   OSA (obstructive sleep apnea) 07/24/2017   Chronic tension-type headache, not intractable 07/29/2016   GAD (generalized anxiety disorder) 04/30/2007   ALLERGIC RHINITIS 02/27/2007   Allergic asthma 02/27/2007    Past Surgical History:  Procedure Laterality Date   ORCHIECTOMY     L orchiectomy and R orchiopexy for torsion   2002   PILONIDAL CYST EXCISION  2000 & 2001   3 times       Home Medications    Prior to Admission medications   Medication Sig Start Date End Date Taking? Authorizing Provider  FLUoxetine (PROZAC) 20 MG capsule Take 1 capsule (20 mg total) by mouth daily. 03/04/20   Karie Schwalbe, MD  metoprolol succinate (TOPROL-XL) 25 MG 24 hr tablet Take by mouth. 06/27/19 06/26/20  [provider]  omeprazole  (PRILOSEC) 20 MG capsule Take 1 capsule (20 mg total) by mouth daily. 11/05/19   Karie Schwalbe, MD    Family History Family History  Problem Relation Age of Onset   Migraines Mother    Celiac disease Mother    Crohn's disease Mother    Migraines Father    Celiac disease Father    Cancer Neg Hx    Colon cancer Neg Hx    Esophageal cancer Neg Hx    Pancreatic cancer Neg Hx    Rectal cancer Neg Hx    Stomach cancer Neg Hx     Social History Social History   Tobacco Use   Smoking status: Never Smoker   Smokeless tobacco: Never Used  Vaping Use   Vaping Use: Never used  Substance Use Topics   Alcohol use: Yes    Comment: occasional   Drug use: No     Allergies   Patient has no known allergies.   Review of Systems Review of Systems   Physical Exam Triage Vital Signs ED Triage Vitals  Enc Vitals Group     BP 08/05/20 1125 131/87     Pulse Rate 08/05/20 1125 100     Resp 08/05/20 1125 18     Temp 08/05/20 1125 98.8 F (37.1 C)     Temp Source 08/05/20 1125 Oral     SpO2 08/05/20 1125  98 %     Weight 08/05/20 1126 290 lb (131.5 kg)     Height 08/05/20 1126 6\' 2"  (1.88 m)     Head Circumference --      Peak Flow --      Pain Score 08/05/20 1126 0     Pain Loc --      Pain Edu? --      Excl. in GC? --    No data found.  Updated Vital Signs BP 131/87    Pulse 100    Temp 98.8 F (37.1 C) (Oral)    Resp 18    Ht 6\' 2"  (1.88 m)    Wt 290 lb (131.5 kg)    SpO2 98%    BMI 37.23 kg/m   Visual Acuity Right Eye Distance:   Left Eye Distance:   Bilateral Distance:    Right Eye Near:   Left Eye Near:    Bilateral Near:     Physical Exam Vitals and nursing note reviewed.  Constitutional:      General: He is not in acute distress.    Appearance: Normal appearance. He is not ill-appearing, toxic-appearing or diaphoretic.  HENT:     Head: Normocephalic and atraumatic.     Nose: Nose normal.  Eyes:     Conjunctiva/sclera: Conjunctivae  normal.  Cardiovascular:     Rate and Rhythm: Normal rate and regular rhythm.  Pulmonary:     Effort: Pulmonary effort is normal.     Breath sounds: Normal breath sounds.  Musculoskeletal:        General: Normal range of motion.     Cervical back: Normal range of motion.  Skin:    General: Skin is warm and dry.  Neurological:     Mental Status: He is alert.  Psychiatric:        Mood and Affect: Mood normal.      UC Treatments / Results  Labs (all labs ordered are listed, but only abnormal results are displayed) Labs Reviewed  NOVEL CORONAVIRUS, NAA  CBC WITH DIFFERENTIAL/PLATELET  COMPREHENSIVE METABOLIC PANEL  TSH    EKG   Radiology No results found.  Procedures Procedures (including critical care time)  Medications Ordered in UC Medications - No data to display  Initial Impression / Assessment and Plan / UC Course  I have reviewed the triage vital signs and the nursing notes.  Pertinent labs & imaging results that were available during my care of the patient were reviewed by me and considered in my medical decision making (see chart for details).     Tachycardia EKG with normal sinus rhythm and normal rate today rate of 87. Nothing concerning on exam today. Patient appears mildly anxious Unsure of cause of tachycardia at this time.  Recommend decrease caffeine and drink more water to ensure hydration.  We will draw some basic lab work. Recommend follow-up with cardiology.  For any continued or worsening problems to go to the ER. No concerns to send to the ER today.  Final Clinical Impressions(s) / UC Diagnoses   Final diagnoses:  Tachycardia     Discharge Instructions     Nothing concerning on your EKG today. Recommend decreasing caffeine and drinking more water to stay hydrated. Drawing some basic lab work Recommend follow-up with cardiology.  Go to the ER for any worsening problems.    ED Prescriptions    None     PDMP not reviewed this  encounter.   08/07/20, NP 08/05/20  1229 ° °

## 2020-08-05 NOTE — ED Triage Notes (Signed)
Pt sts upon waking this morning, his heart was racing. Also reports feeling diaphoretic. Sts heart is not racing now like it was when waking up this morning.

## 2020-08-05 NOTE — Discharge Instructions (Signed)
Nothing concerning on your EKG today. Recommend decreasing caffeine and drinking more water to stay hydrated. Drawing some basic lab work Recommend follow-up with cardiology.  Go to the ER for any worsening problems.

## 2020-08-06 LAB — CBC WITH DIFFERENTIAL/PLATELET
Basophils Absolute: 0.1 10*3/uL (ref 0.0–0.2)
Basos: 1 %
EOS (ABSOLUTE): 0.1 10*3/uL (ref 0.0–0.4)
Eos: 1 %
Hematocrit: 42.4 % (ref 37.5–51.0)
Hemoglobin: 14.2 g/dL (ref 13.0–17.7)
Immature Grans (Abs): 0 10*3/uL (ref 0.0–0.1)
Immature Granulocytes: 0 %
Lymphocytes Absolute: 1.8 10*3/uL (ref 0.7–3.1)
Lymphs: 26 %
MCH: 30.7 pg (ref 26.6–33.0)
MCHC: 33.5 g/dL (ref 31.5–35.7)
MCV: 92 fL (ref 79–97)
Monocytes Absolute: 0.6 10*3/uL (ref 0.1–0.9)
Monocytes: 9 %
Neutrophils Absolute: 4.5 10*3/uL (ref 1.4–7.0)
Neutrophils: 63 %
Platelets: 249 10*3/uL (ref 150–450)
RBC: 4.62 x10E6/uL (ref 4.14–5.80)
RDW: 13.5 % (ref 11.6–15.4)
WBC: 7.1 10*3/uL (ref 3.4–10.8)

## 2020-08-06 LAB — COMPREHENSIVE METABOLIC PANEL
ALT: 30 IU/L (ref 0–44)
AST: 37 IU/L (ref 0–40)
Albumin/Globulin Ratio: 1.3 (ref 1.2–2.2)
Albumin: 4.5 g/dL (ref 4.0–5.0)
Alkaline Phosphatase: 76 IU/L (ref 44–121)
BUN/Creatinine Ratio: 10 (ref 9–20)
BUN: 11 mg/dL (ref 6–20)
Bilirubin Total: 0.4 mg/dL (ref 0.0–1.2)
CO2: 23 mmol/L (ref 20–29)
Calcium: 9.6 mg/dL (ref 8.7–10.2)
Chloride: 100 mmol/L (ref 96–106)
Creatinine, Ser: 1.1 mg/dL (ref 0.76–1.27)
GFR calc Af Amer: 97 mL/min/{1.73_m2} (ref >59)
GFR calc non Af Amer: 84 mL/min/{1.73_m2} (ref >59)
Globulin, Total: 3.4 g/dL (ref 1.5–4.5)
Glucose: 97 mg/dL (ref 65–99)
Potassium: 4.7 mmol/L (ref 3.5–5.2)
Sodium: 139 mmol/L (ref 134–144)
Total Protein: 7.9 g/dL (ref 6.0–8.5)

## 2020-08-06 LAB — TSH: TSH: 1.34 u[IU]/mL (ref 0.450–4.500)

## 2020-08-06 LAB — NOVEL CORONAVIRUS, NAA: SARS-CoV-2, NAA: NOT DETECTED

## 2020-08-06 LAB — SARS-COV-2, NAA 2 DAY TAT

## 2020-09-02 DIAGNOSIS — E6609 Other obesity due to excess calories: Secondary | ICD-10-CM | POA: Diagnosis not present

## 2020-09-02 DIAGNOSIS — G4733 Obstructive sleep apnea (adult) (pediatric): Secondary | ICD-10-CM | POA: Diagnosis not present

## 2020-09-02 DIAGNOSIS — R000 Tachycardia, unspecified: Secondary | ICD-10-CM | POA: Diagnosis not present

## 2020-09-02 DIAGNOSIS — R0602 Shortness of breath: Secondary | ICD-10-CM | POA: Diagnosis not present

## 2020-10-13 ENCOUNTER — Other Ambulatory Visit: Payer: Self-pay | Admitting: Internal Medicine

## 2020-10-21 ENCOUNTER — Ambulatory Visit: Payer: Self-pay | Admitting: Medical

## 2020-10-21 ENCOUNTER — Other Ambulatory Visit: Payer: Self-pay

## 2020-10-21 ENCOUNTER — Encounter: Payer: Self-pay | Admitting: Medical

## 2020-10-21 VITALS — BP 140/88 | HR 75 | Temp 98.0°F | Resp 18 | Wt 298.8 lb

## 2020-10-21 DIAGNOSIS — F419 Anxiety disorder, unspecified: Secondary | ICD-10-CM

## 2020-10-21 DIAGNOSIS — R0789 Other chest pain: Secondary | ICD-10-CM

## 2020-10-21 DIAGNOSIS — R635 Abnormal weight gain: Secondary | ICD-10-CM

## 2020-10-21 LAB — POCT URINALYSIS DIPSTICK
Bilirubin, UA: NEGATIVE
Blood, UA: NEGATIVE
Glucose, UA: NEGATIVE
Ketones, UA: NEGATIVE
Leukocytes, UA: NEGATIVE
Nitrite, UA: NEGATIVE
Protein, UA: NEGATIVE
Spec Grav, UA: 1.01 (ref 1.010–1.025)
Urobilinogen, UA: 0.2 E.U./dL
pH, UA: 6.5 (ref 5.0–8.0)

## 2020-10-21 MED ORDER — HYDROXYZINE HCL 25 MG PO TABS: 25.0000 mg | ORAL_TABLET | Freq: Four times a day (QID) | ORAL | 0 refills | Status: DC | PRN

## 2020-10-21 NOTE — Progress Notes (Signed)
Subjective:    Patient ID: William Stuart, male    DOB: 07-Mar-1981, 40 y.o.   MRN: [id number]  HPI 40 yo male presents today with complaints of  Started yesterday, stomach nausea, as he  sitting at car [employer], started sweating, checked  Hr and seemed okay. Last night laid  2 hours in bed unable to sleep, thinking about work , checked heart rate  90-100. Works at [employer]  As Therapist, occupational in [place].   Dull ache in abdominal pain, generalized yesterday.   Stressed due to work, [employer] graduate program that is is taking  Ad his  Family. He currently is on Prozac and is taking the medication as prescribed. He is not in counseling at this time.  He has concerns because he has had colleagues that have  died recently. Had a colleague who died  2 weeks ago at  44 yo  with a pulmonary emboli.  In his graduate program  Male  50's died of pancreatic cancer who was part of his  MFA program.  Did eat a protein bar today. Yesterday ate at noon.(pretzels).  Sees Dr. Juliann Pares , had a followup in Jan 2022 and all was fine , and seen at urgent in December because sudden increase in heart rate and  Sweating.Similar symptoms yesterday and today.   I reviewed labs from 2 months ago with patient..  Blood pressure 140/88, pulse 75, temperature 98 F (36.7 C), temperature source Oral, resp. rate 18, weight 298 lb 12.8 oz (135.5 kg), SpO2 98 %.   No Known Allergies  Past Medical History:  Diagnosis Date  . Allergy   . Anxiety   . Asthma   . GERD (gastroesophageal reflux disease)    Past Surgical History:  Procedure Laterality Date  . ORCHIECTOMY     L orchiectomy and R orchiopexy for torsion   2002  . PILONIDAL CYST EXCISION  2000 & 2001   3 times   Father with Celiac and trigemminal nerve Mother with Crohns diz  Review of Systems  Constitutional: Positive for chills and fatigue.  HENT: Negative for ear discharge, sinus  pressure, sinus pain and voice change.   Eyes: Negative for photophobia and visual disturbance.  Respiratory: Positive for chest tightness (yesterday notice over the last two weeks.). Negative for cough and shortness of breath.        Breathing more shallow, not as deep of breath.  Cardiovascular: Positive for palpitations. Negative for chest pain and leg swelling.  Gastrointestinal: Positive for abdominal pain and nausea. Negative for anal bleeding, blood in stool, constipation, diarrhea, rectal pain and vomiting.  Endocrine: Positive for polydipsia (feels like he is dehydrated.). Negative for polyphagia and polyuria.  Genitourinary: Negative for difficulty urinating.   Patient would like a full examine to see if anything medically contributing to how he is feeling.    Objective:   Physical Exam Vitals and nursing note reviewed.  Constitutional:      Appearance: Normal appearance. He is obese.  HENT:     Head: Normocephalic and atraumatic.     Right Ear: Tympanic membrane, ear canal and external ear normal.     Left Ear: Tympanic membrane, ear canal and external ear normal.     Mouth/Throat:     Mouth: Mucous membranes are moist.     Pharynx: Oropharynx is clear.  Eyes:     Extraocular Movements: Extraocular movements intact.     Conjunctiva/sclera: Conjunctivae  normal.     Pupils: Pupils are equal, round, and reactive to light.  Cardiovascular:     Rate and Rhythm: Normal rate and regular rhythm.     Heart sounds: Normal heart sounds.  Pulmonary:     Effort: Pulmonary effort is normal.     Breath sounds: Normal breath sounds.  Abdominal:     General: Abdomen is flat. Bowel sounds are normal. There is no distension.     Palpations: Abdomen is soft.     Tenderness: There is no abdominal tenderness.  Musculoskeletal:        General: Normal range of motion.     Cervical back: Normal range of motion and neck supple.  Skin:    General: Skin is warm and dry.     Capillary Refill:  Capillary refill takes less than 2 seconds.  Neurological:     General: No focal deficit present.     Mental Status: He is alert. Mental status is at baseline.     GCS: GCS eye subscore is 4. GCS verbal subscore is 5. GCS motor subscore is 6.     Cranial Nerves: Cranial nerves are intact.     Sensory: Sensation is intact.     Motor: Motor function is intact.     Coordination: Coordination is intact.  Psychiatric:        Attention and Perception: Attention and perception normal.        Mood and Affect: Affect normal. Mood is anxious.        Speech: Speech normal.        Behavior: Behavior normal.        Thought Content: Thought content normal.        Cognition and Memory: Cognition and memory normal.        Judgment: Judgment normal.    Anxious appearing in room.  Fidgetting  in room.    Results for orders placed or performed in visit on 10/21/20 (from the past 24 hour(s))  POCT urinalysis dipstick     Status: Normal   Collection Time: 10/21/20 10:30 AM  Result Value Ref Range   Color, UA yellow    Clarity, UA clear    Glucose, UA Negative Negative   Bilirubin, UA Negative    Ketones, UA Negative    Spec Grav, UA 1.010 1.010 - 1.025   Blood, UA Negative    pH, UA 6.5 5.0 - 8.0   Protein, UA Negative Negative   Urobilinogen, UA 0.2 0.2 or 1.0 E.U./dL   Nitrite, UA Negative    Leukocytes, UA Negative Negative   Appearance     Odor     EKG sinus brady 57 bpm otherwise wnl    Assessment & Plan:  Anxiety I reassured the patient. Follow up with your doctor call for appointment today. Given EAP information. Reviewed EKG with patient. Labs pending. Orders Placed This Encounter  Procedures  . CMP12+LP+TP+TSH+6AC+PSA+CBC.  Marland Kitchen VITAMIN D 25 Hydroxy (Vit-D Deficiency, Fractures)  . POCT urinalysis dipstick  . EKG 12-Lead  Offered patient work note but he declines. If he needs one to let me know. Will contact patient with labs and to see how he is doing on  Hydroxyzine. Patient verbalizes understanding and has no questions at discharge. Meds ordered this encounter  Medications  . hydrOXYzine (ATARAX/VISTARIL) 25 MG tablet    Sig: Take 1 tablet (25 mg total) by mouth every 6 (six) hours as needed for anxiety or nausea.    Dispense:  30 tablet    Refill:  0   patient verbalizes understanding and has no questions at discharge.

## 2020-10-21 NOTE — Patient Instructions (Addendum)
[email or web address]">  Generalized Anxiety Disorder, Adult Generalized anxiety disorder (GAD) is a mental health condition. Unlike normal worries, anxiety related to GAD is not triggered by a specific event. These worries do not fade or get better with time. GAD interferes with relationships, work, and school. GAD symptoms can vary from mild to severe. People with severe GAD can have intense waves of anxiety with physical symptoms that are similar to panic attacks. What are the causes? The exact cause of GAD is not known, but the following are believed to have an impact:  Differences in natural brain chemicals.  Genes passed down from parents to children.  Differences in the way threats are perceived.  Development during childhood.  Personality. What increases the risk? The following factors may make you more likely to develop this condition:  Being male.  Having a family history of anxiety disorders.  Being very shy.  Experiencing very stressful life events, such as the death of a loved one.  Having a very stressful family environment. What are the signs or symptoms? People with GAD often worry excessively about many things in their lives, such as their health and family. Symptoms may also include:  Mental and emotional symptoms: ? Worrying excessively about natural disasters. ? Fear of being late. ? Difficulty concentrating. ? Fears that others are judging your performance.  Physical symptoms: ? Fatigue. ? Headaches, muscle tension, muscle twitches, trembling, or feeling shaky. ? Feeling like your heart is pounding or beating very fast. ? Feeling out of breath or like you cannot take a deep breath. ? Having trouble falling asleep or staying asleep, or experiencing restlessness. ? Sweating. ? Nausea, diarrhea, or irritable bowel syndrome (IBS).  Behavioral symptoms: ? Experiencing erratic moods or irritability. ? Avoidance of new situations. ? Avoidance of  people. ? Extreme difficulty making decisions. How is this diagnosed? This condition is diagnosed based on your symptoms and medical history. You will also have a physical exam. Your health care provider may perform tests to rule out other possible causes of your symptoms. To be diagnosed with GAD, a person must have anxiety that:  Is out of his or her control.  Affects several different aspects of his or her life, such as work and relationships.  Causes distress that makes him or her unable to take part in normal activities.  Includes at least three symptoms of GAD, such as restlessness, fatigue, trouble concentrating, irritability, muscle tension, or sleep problems. Before your health care provider can confirm a diagnosis of GAD, these symptoms must be present more days than they are not, and they must last for 6 months or longer. How is this treated? This condition may be treated with:  Medicine. Antidepressant medicine is usually prescribed for long-term daily control. Anti-anxiety medicines may be added in severe cases, especially when panic attacks occur.  Talk therapy (psychotherapy). Certain types of talk therapy can be helpful in treating GAD by providing support, education, and guidance. Options include: ? Cognitive behavioral therapy (CBT). People learn coping skills and self-calming techniques to ease their physical symptoms. They learn to identify unrealistic thoughts and behaviors and to replace them with more appropriate thoughts and behaviors. ? Acceptance and commitment therapy (ACT). This treatment teaches people how to be mindful as a way to cope with unwanted thoughts and feelings. ? Biofeedback. This process trains you to manage your body's response (physiological response) through breathing techniques and relaxation methods. You will work with a therapist while machines are used to monitor your physical   symptoms.  Stress management techniques. These include yoga,  meditation, and exercise. A mental health specialist can help determine which treatment is best for you. Some people see improvement with one type of therapy. However, other people require a combination of therapies.   Follow these instructions at home: Lifestyle  Maintain a consistent routine and schedule.  Anticipate stressful situations. Create a plan, and allow extra time to work with your plan.  Practice stress management or self-calming techniques that you have learned from your therapist or your health care provider. General instructions  Take over-the-counter and prescription medicines only as told by your health care provider.  Understand that you are likely to have setbacks. Accept this and be kind to yourself as you persist to take better care of yourself.  Recognize and accept your accomplishments, even if you judge them as small.  Keep all follow-up visits as told by your health care provider. This is important. Contact a health care provider if:  Your symptoms do not get better.  Your symptoms get worse.  You have signs of depression, such as: ? A persistently sad or irritable mood. ? Loss of enjoyment in activities that used to bring you joy. ? Change in weight or eating. ? Changes in sleeping habits. ? Avoiding friends or family members. ? Loss of energy for normal tasks. ? Feelings of guilt or worthlessness. Get help right away if:  You have serious thoughts about hurting yourself or others. If you ever feel like you may hurt yourself or others, or have thoughts about taking your own life, get help right away. Go to your nearest emergency department or:  Call your local emergency services (911 in the [place]  Call a suicide crisis helpline, such as the National Suicide Prevention Lifeline at (860)682-4354. This is open 24 hours a day in the [place]  Text the Crisis Text Line at 404-777-3371 (in the [place] Summary  Generalized anxiety disorder (GAD) is a mental  health condition that involves worry that is not triggered by a specific event.  People with GAD often worry excessively about many things in their lives, such as their health and family.  GAD may cause symptoms such as restlessness, trouble concentrating, sleep problems, frequent sweating, nausea, diarrhea, headaches, and trembling or muscle twitching.  A mental health specialist can help determine which treatment is best for you. Some people see improvement with one type of therapy. However, other people require a combination of therapies. This information is not intended to replace advice given to you by your health care provider. Make sure you discuss any questions you have with your health care provider. Document Revised: 05/15/2019 Document Reviewed: 05/15/2019 Elsevier Patient Education  2021 Elsevier Inc.   Hydroxyzine capsules or tablets What is this medicine? HYDROXYZINE (hye DROX i zeen) is an antihistamine. This medicine is used to treat allergy symptoms. It is also used to treat anxiety and tension. This medicine can be used with other medicines to induce sleep before surgery. This medicine may be used for other purposes; ask your health care provider or pharmacist if you have questions. COMMON BRAND NAME(S): ANX, Atarax, Rezine, Vistaril What should I tell my health care provider before I take this medicine? They need to know if you have any of these conditions:  glaucoma  heart disease  history of irregular heartbeat  kidney disease  liver disease  lung or breathing disease, like asthma  stomach or intestine problems  thyroid disease  trouble passing urine  an unusual  or allergic reaction to hydroxyzine, cetirizine, other medicines, foods, dyes or preservatives  pregnant or trying to get pregnant  breast-feeding How should I use this medicine? Take this medicine by mouth with a full glass of water. Follow the directions on the prescription label. You may  take this medicine with food or on an empty stomach. Take your medicine at regular intervals. Do not take your medicine more often than directed. Talk to your pediatrician regarding the use of this medicine in children. Special care may be needed. While this drug may be prescribed for children as young as 82 years of age for selected conditions, precautions do apply. Patients over 9 years old may have a stronger reaction and need a smaller dose. Overdosage: If you think you have taken too much of this medicine contact a poison control center or emergency room at once. NOTE: This medicine is only for you. Do not share this medicine with others. What if I miss a dose? If you miss a dose, take it as soon as you can. If it is almost time for your next dose, take only that dose. Do not take double or extra doses. What may interact with this medicine? Do not take this medicine with any of the following medications:  cisapride  dronedarone  pimozide  thioridazine This medicine may also interact with the following medications:  alcohol  antihistamines for allergy, cough, and cold  atropine  barbiturate medicines for sleep or seizures, like phenobarbital  certain antibiotics like erythromycin or clarithromycin  certain medicines for anxiety or sleep  certain medicines for bladder problems like oxybutynin, tolterodine  certain medicines for depression or psychotic disturbances  certain medicines for irregular heart beat  certain medicines for Parkinson's disease like benztropine, trihexyphenidyl  certain medicines for seizures like phenobarbital, primidone  certain medicines for stomach problems like dicyclomine, hyoscyamine  certain medicines for travel sickness like scopolamine  ipratropium  narcotic medicines for pain  other medicines that prolong the QT interval (an abnormal heart rhythm) like dofetilide This list may not describe all possible interactions. Give your  health care provider a list of all the medicines, herbs, non-prescription drugs, or dietary supplements you use. Also tell them if you smoke, drink alcohol, or use illegal drugs. Some items may interact with your medicine. What should I watch for while using this medicine? Tell your doctor or health care professional if your symptoms do not improve. You may get drowsy or dizzy. Do not drive, use machinery, or do anything that needs mental alertness until you know how this medicine affects you. Do not stand or sit up quickly, especially if you are an older patient. This reduces the risk of dizzy or fainting spells. Alcohol may interfere with the effect of this medicine. Avoid alcoholic drinks. Your mouth may get dry. Chewing sugarless gum or sucking hard candy, and drinking plenty of water may help. Contact your doctor if the problem does not go away or is severe. This medicine may cause dry eyes and blurred vision. If you wear contact lenses you may feel some discomfort. Lubricating drops may help. See your eye doctor if the problem does not go away or is severe. If you are receiving skin tests for allergies, tell your doctor you are using this medicine. What side effects may I notice from receiving this medicine? Side effects that you should report to your doctor or health care professional as soon as possible:  allergic reactions like skin rash, itching or hives, swelling of  the face, lips, or tongue  changes in vision  confusion  fast, irregular heartbeat  seizures  tremor  trouble passing urine or change in the amount of urine Side effects that usually do not require medical attention (report to your doctor or health care professional if they continue or are bothersome):  constipation  drowsiness  dry mouth  headache  tiredness This list may not describe all possible side effects. Call your doctor for medical advice about side effects. You may report side effects to FDA at  1-800-FDA-1088. Where should I keep my medicine? Keep out of the reach of children. Store at room temperature between 15 and 30 degrees C (59 and 86 degrees F). Keep container tightly closed. Throw away any unused medicine after the expiration date. NOTE: This sheet is a summary. It may not cover all possible information. If you have questions about this medicine, talk to your doctor, pharmacist, or health care provider.  2021 Elsevier/Gold Standard (2018-07-16 13:19:55)

## 2020-10-22 LAB — CMP12+LP+TP+TSH+6AC+PSA+CBC…
ALT: 28 IU/L (ref 0–44)
AST: 35 IU/L (ref 0–40)
Albumin/Globulin Ratio: 1.3 (ref 1.2–2.2)
Albumin: 4.5 g/dL (ref 4.0–5.0)
Alkaline Phosphatase: 89 IU/L (ref 44–121)
BUN/Creatinine Ratio: 13 (ref 9–20)
BUN: 13 mg/dL (ref 6–20)
Basophils Absolute: 0 10*3/uL (ref 0.0–0.2)
Basos: 1 %
Bilirubin Total: 0.7 mg/dL (ref 0.0–1.2)
Calcium: 9.4 mg/dL (ref 8.7–10.2)
Chloride: 96 mmol/L (ref 96–106)
Chol/HDL Ratio: 3.2 ratio (ref 0.0–5.0)
Cholesterol, Total: 185 mg/dL (ref 100–199)
Creatinine, Ser: 1 mg/dL (ref 0.76–1.27)
EOS (ABSOLUTE): 0 10*3/uL (ref 0.0–0.4)
Eos: 1 %
Estimated CHD Risk: <0.5 times avg. (ref 0.0–1.0)
Free Thyroxine Index: 2.3 (ref 1.2–4.9)
GGT: 73 IU/L — ABNORMAL HIGH (ref 0–65)
Globulin, Total: 3.5 g/dL (ref 1.5–4.5)
Glucose: 100 mg/dL — ABNORMAL HIGH (ref 65–99)
HDL: 57 mg/dL (ref >39)
Hematocrit: 42.2 % (ref 37.5–51.0)
Hemoglobin: 13.8 g/dL (ref 13.0–17.7)
Immature Grans (Abs): 0 10*3/uL (ref 0.0–0.1)
Immature Granulocytes: 0 %
Iron: 244 ug/dL — ABNORMAL HIGH (ref 38–169)
LDH: 214 IU/L (ref 121–224)
LDL Chol Calc (NIH): 96 mg/dL (ref 0–99)
Lymphocytes Absolute: 1.7 10*3/uL (ref 0.7–3.1)
Lymphs: 31 %
MCH: 31.4 pg (ref 26.6–33.0)
MCHC: 32.7 g/dL (ref 31.5–35.7)
MCV: 96 fL (ref 79–97)
Monocytes Absolute: 0.6 10*3/uL (ref 0.1–0.9)
Monocytes: 11 %
Neutrophils Absolute: 3.1 10*3/uL (ref 1.4–7.0)
Neutrophils: 56 %
Phosphorus: 3 mg/dL (ref 2.8–4.1)
Platelets: 216 10*3/uL (ref 150–450)
Potassium: 4.3 mmol/L (ref 3.5–5.2)
Prostate Specific Ag, Serum: 1 ng/mL (ref 0.0–4.0)
RBC: 4.4 x10E6/uL (ref 4.14–5.80)
RDW: 13.3 % (ref 11.6–15.4)
Sodium: 134 mmol/L (ref 134–144)
T3 Uptake Ratio: 32 % (ref 24–39)
T4, Total: 7.2 ug/dL (ref 4.5–12.0)
TSH: 1.5 u[IU]/mL (ref 0.450–4.500)
Total Protein: 8 g/dL (ref 6.0–8.5)
Triglycerides: 190 mg/dL — ABNORMAL HIGH (ref 0–149)
Uric Acid: 7 mg/dL (ref 3.8–8.4)
VLDL Cholesterol Cal: 32 mg/dL (ref 5–40)
WBC: 5.5 10*3/uL (ref 3.4–10.8)
eGFR: 98 mL/min/{1.73_m2} (ref >59)

## 2020-10-22 LAB — VITAMIN D 25 HYDROXY (VIT D DEFICIENCY, FRACTURES): Vit D, 25-Hydroxy: 18.7 ng/mL — ABNORMAL LOW (ref 30.0–100.0)

## 2020-10-22 LAB — HGB A1C W/O EAG: Hgb A1c MFr Bld: 5.2 % (ref 4.8–5.6)

## 2020-10-23 ENCOUNTER — Encounter: Payer: Self-pay | Admitting: Medical

## 2020-11-02 ENCOUNTER — Encounter: Payer: Self-pay | Admitting: Internal Medicine

## 2020-11-02 ENCOUNTER — Other Ambulatory Visit: Payer: Self-pay

## 2020-11-02 ENCOUNTER — Ambulatory Visit (INDEPENDENT_AMBULATORY_CARE_PROVIDER_SITE_OTHER): Payer: BC Managed Care – PPO | Admitting: Internal Medicine

## 2020-11-02 DIAGNOSIS — F411 Generalized anxiety disorder: Secondary | ICD-10-CM | POA: Diagnosis not present

## 2020-11-02 DIAGNOSIS — K219 Gastro-esophageal reflux disease without esophagitis: Secondary | ICD-10-CM | POA: Diagnosis not present

## 2020-11-02 DIAGNOSIS — Z0000 Encounter for general adult medical examination without abnormal findings: Secondary | ICD-10-CM | POA: Diagnosis not present

## 2020-11-02 NOTE — Assessment & Plan Note (Signed)
Healthy but has let himself go Has cut out sugar Starting exercise program Flu vaccine in the fall Will consider another COVID booster

## 2020-11-02 NOTE — Progress Notes (Signed)
Subjective:    Patient ID: William Stuart, male    DOB: [birth year], 40 y.o.   MRN: [id number]  HPI Here for physical This visit occurred during the SARS-CoV-2 public health emergency.  Safety protocols were in place, including screening questions prior to the visit, additional usage of staff PPE, and extensive cleaning of exam room while observing appropriate contact time as indicated for disinfecting solutions.   Went to [place] on campus Felt a little ill---some stomach issues (and then had a sweat, then cold) Got EKG (okay), urine (negative), lots of blood work Symptoms did go away Reviewed the lab work  Anxiety is in "a better place" It helps him get through the day Doesn't avoid interactions, work, etc  Still on metoprolol for skipped beats  Current Outpatient Medications on File Prior to Visit  Medication Sig Dispense Refill  . FLUoxetine (PROZAC) 20 MG capsule Take 1 capsule (20 mg total) by mouth daily. 90 capsule 3  . hydrOXYzine (ATARAX/VISTARIL) 25 MG tablet Take 1 tablet (25 mg total) by mouth every 6 (six) hours as needed for anxiety or nausea. 30 tablet 0  . omeprazole (PRILOSEC) 20 MG capsule TAKE 1 CAPSULE(20 MG) BY MOUTH DAILY 90 capsule 3  . metoprolol succinate (TOPROL-XL) 25 MG 24 hr tablet Take by mouth.     No current facility-administered medications on file prior to visit.    No Known Allergies  Past Medical History:  Diagnosis Date  . Allergy   . Anxiety   . Asthma   . GERD (gastroesophageal reflux disease)     Past Surgical History:  Procedure Laterality Date  . ORCHIECTOMY     L orchiectomy and R orchiopexy for torsion   2002  . PILONIDAL CYST EXCISION  2000 & 2001   3 times    Family History  Problem Relation Age of Onset  . Migraines Mother   . Celiac disease Mother   . Crohn's disease Mother   . Migraines Father   . Celiac disease Father   . Cancer Neg Hx   . Colon cancer Neg Hx   . Esophageal cancer Neg Hx   . Pancreatic cancer  Neg Hx   . Rectal cancer Neg Hx   . Stomach cancer Neg Hx     Social History   Socioeconomic History  . Marital status: Married    Spouse name: Not on file  . Number of children: 2  . Years of education: Not on file  . Highest education level: Not on file  Occupational History  . 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 Smoking status: Never Smoker  . Smokeless tobacco: Never Used  Vaping Use  . Vaping Use: Never used  Substance and Sexual Activity  . Alcohol use: Yes    Comment: occasional  . Drug use: No  . Sexual activity: Not on file  Other Topics Concern  . Not on file  Social History Narrative  . Not on file   Social Determinants of Health   Financial Resource Strain: Not on file  Food Insecurity: Not on file  Transportation Needs: Not on file  Physical Activity: Not on file  Stress: Not on file  Social Connections: Not on file  Intimate Partner Violence: Not on file   Review of Systems  Constitutional: Positive for unexpected weight change.       Did gain weight--relates to sugar intake (and he has now cut back  on this) Plans to join boxing gym  HENT: Negative for hearing loss and tinnitus.        Keeps up with dentist  Eyes: Negative for visual disturbance.       No diplopia or unilateral vision loss  Respiratory: Negative for cough, chest tightness and shortness of breath.   Cardiovascular: Negative for leg swelling.       Did have slight chest pain/palpitations----gone again now  Gastrointestinal: Negative for blood in stool and constipation.       Heartburn controlled  Endocrine: Negative for polyuria.       Drinks a lot--but skin feels dry still  Genitourinary: Negative for difficulty urinating and urgency.       No sexual problems  Musculoskeletal: Negative for arthralgias and joint swelling.       Some back pain--relates to position at desk  Skin: Negative for rash.   Allergic/Immunologic: Positive for environmental allergies. Negative for immunocompromised state.       No meds generally  Neurological: Negative for dizziness, syncope, light-headedness and headaches.  Hematological: Negative for adenopathy. Does not bruise/bleed easily.  Psychiatric/Behavioral: Negative for dysphoric mood.       Not a great sleeper--thinks the gym will help this Never tolerated CPAP       Objective:   Physical Exam Constitutional:      Appearance: Normal appearance.  HENT:     Right Ear: Tympanic membrane, ear canal and external ear normal.     Left Ear: Tympanic membrane, ear canal and external ear normal.     Mouth/Throat:     Pharynx: No oropharyngeal exudate or posterior oropharyngeal erythema.  Eyes:     Conjunctiva/sclera: Conjunctivae normal.     Pupils: Pupils are equal, round, and reactive to light.  Cardiovascular:     Rate and Rhythm: Normal rate and regular rhythm.     Pulses: Normal pulses.     Heart sounds: No murmur heard. No gallop.   Pulmonary:     Effort: Pulmonary effort is normal.     Breath sounds: Normal breath sounds. No wheezing or rales.  Abdominal:     Palpations: Abdomen is soft.     Tenderness: There is no abdominal tenderness.  Musculoskeletal:     Cervical back: Neck supple.     Right lower leg: No edema.     Left lower leg: No edema.  Lymphadenopathy:     Cervical: No cervical adenopathy.  Skin:    General: Skin is warm.     Findings: No rash.  Neurological:     General: No focal deficit present.     Mental Status: He is alert and oriented to person, place, and time.  Psychiatric:        Mood and Affect: Mood normal.        Behavior: Behavior normal.            Assessment & Plan:

## 2020-11-02 NOTE — Assessment & Plan Note (Signed)
Doing okay again with PPI once a day

## 2020-11-02 NOTE — Assessment & Plan Note (Signed)
Better with the fluoxetine Continue at 20mg  daily

## 2020-12-07 ENCOUNTER — Other Ambulatory Visit: Payer: Self-pay | Admitting: Internal Medicine

## 2020-12-08 ENCOUNTER — Ambulatory Visit
Admission: RE | Admit: 2020-12-08 | Discharge: 2020-12-08 | Disposition: A | Discharge status: Home / Self Care | Payer: BC Managed Care – PPO | Source: Ambulatory Visit | Attending: Emergency Medicine | Admitting: Emergency Medicine

## 2020-12-08 ENCOUNTER — Other Ambulatory Visit: Payer: Self-pay

## 2020-12-08 VITALS — BP 125/85 | HR 71 | Temp 98.5°F | Resp 13

## 2020-12-08 DIAGNOSIS — R0602 Shortness of breath: Secondary | ICD-10-CM | POA: Diagnosis not present

## 2020-12-08 DIAGNOSIS — R079 Chest pain, unspecified: Secondary | ICD-10-CM

## 2020-12-08 NOTE — ED Provider Notes (Signed)
Renaldo Fiddler    CSN: [id number] Arrival date & time: 12/08/20  1357      History   Chief Complaint Chief Complaint  Patient presents with  . Chest Pain  . Shortness of Breath    HPI William Stuart is a 40 y.o. male.   Patient presents with 3-week history of chest pain and shortness of breath.  He describes the chest pain as "tightness" or "pressure."  The pain is intermittent, was worse this morning; has improved and is currently 3/10.  He reports no aggravating or alleviating factors.  He denies numbness, weakness, current shortness of breath, dizziness, headache, nausea, vomiting, or other symptoms.  No treatments attempted at home.  Patient was seen by occupational health at [facility] for similar symptoms on 10/21/2020; diagnosed with chest tightness, acute anxiety, weight gain.  He also was seen by his PCP on 11/02/2020 for his yearly physical; diagnosed with generalized anxiety disorder and GERD.  He states he is followed by cardiology and had a normal stress test approximately 1 year ago.  His medical history includes asthma, allergic rhinitis, chronic tension headache, GERD, anxiety.  The history is provided by the patient and medical records.    Past Medical History:  Diagnosis Date  . Allergy   . Anxiety   . Asthma   . GERD (gastroesophageal reflux disease)     Patient Active Problem List   Diagnosis Date Noted  . Dry mouth, unspecified 12/20/2019  . Preventative health care 03/19/2019  . GERD (gastroesophageal reflux disease) 02/14/2018  . OSA (obstructive sleep apnea) 07/24/2017  . Chronic tension-type headache, not intractable 07/29/2016  . GAD (generalized anxiety disorder) 04/30/2007  . ALLERGIC RHINITIS 02/27/2007  . Allergic asthma 02/27/2007    Past Surgical History:  Procedure Laterality Date  . ORCHIECTOMY     L orchiectomy and R orchiopexy for torsion   2002  . PILONIDAL CYST EXCISION  2000 & 2001   3 times       Home Medications    Prior  to Admission medications   Medication Sig Start Date End Date Taking? Authorizing Provider  FLUoxetine (PROZAC) 20 MG capsule TAKE 1 CAPSULE(20 MG) BY MOUTH DAILY 12/07/20  Yes Tillman Abide I, MD  omeprazole (PRILOSEC) 20 MG capsule TAKE 1 CAPSULE(20 MG) BY MOUTH DAILY 10/13/20  Yes Karie Schwalbe, MD  hydrOXYzine (ATARAX/VISTARIL) 25 MG tablet Take 1 tablet (25 mg total) by mouth every 6 (six) hours as needed for anxiety or nausea. 10/21/20   Ratcliffe, Wynn Banker, PA-C  metoprolol succinate (TOPROL-XL) 25 MG 24 hr tablet Take 25 mg by mouth. 06/27/19 06/26/20  [provider]    Family History Family History  Problem Relation Age of Onset  . Migraines Mother   . Celiac disease Mother   . Crohn's disease Mother   . Migraines Father   . Celiac disease Father   . Cancer Neg Hx   . Colon cancer Neg Hx   . Esophageal cancer Neg Hx   . Pancreatic cancer Neg Hx   . Rectal cancer Neg Hx   . Stomach cancer Neg Hx     Social History Social History   Tobacco Use  . Smoking status: Never Smoker  . Smokeless tobacco: Never Used  Vaping Use  . Vaping Use: Never used  Substance Use Topics  . Alcohol use: Yes    Comment: occasional  . Drug use: No     Allergies   Patient has no known allergies.  Review of Systems Review of Systems  Constitutional: Negative for chills and fever.  HENT: Negative for ear pain and sore throat.   Respiratory: Positive for shortness of breath. Negative for cough.   Cardiovascular: Positive for chest pain. Negative for palpitations.  Gastrointestinal: Negative for abdominal pain, nausea and vomiting.  Genitourinary: Negative for hematuria.  Neurological: Negative for dizziness, syncope, weakness and numbness.  All other systems reviewed and are negative.    Physical Exam Triage Vital Signs ED Triage Vitals  Enc Vitals Group     BP      Pulse      Resp      Temp      Temp src      SpO2      Weight      Height      Head  Circumference      Peak Flow      Pain Score      Pain Loc      Pain Edu?      Excl. in GC?    No data found.  Updated Vital Signs BP 125/85 (BP Location: Left Arm)   Pulse 71   Temp 98.5 F (36.9 C) (Oral)   Resp 13   SpO2 96%   Visual Acuity Right Eye Distance:   Left Eye Distance:   Bilateral Distance:    Right Eye Near:   Left Eye Near:    Bilateral Near:     Physical Exam Vitals and nursing note reviewed.  Constitutional:      General: He is not in acute distress.    Appearance: Normal appearance. He is well-developed. He is not ill-appearing.  HENT:     Head: Normocephalic and atraumatic.     Mouth/Throat:     Mouth: Mucous membranes are moist.  Eyes:     Conjunctiva/sclera: Conjunctivae normal.  Cardiovascular:     Rate and Rhythm: Normal rate and regular rhythm.     Heart sounds: Normal heart sounds.  Pulmonary:     Effort: Pulmonary effort is normal. No respiratory distress.     Breath sounds: Normal breath sounds.  Abdominal:     Palpations: Abdomen is soft.     Tenderness: There is no abdominal tenderness.  Musculoskeletal:     Cervical back: Neck supple.  Skin:    General: Skin is warm and dry.  Neurological:     General: No focal deficit present.     Mental Status: He is alert and oriented to person, place, and time.     Sensory: No sensory deficit.     Motor: No weakness.     Gait: Gait normal.  Psychiatric:        Mood and Affect: Mood normal.        Behavior: Behavior normal.      UC Treatments / Results  Labs (all labs ordered are listed, but only abnormal results are displayed) Labs Reviewed - No data to display  EKG   Radiology No results found.  Procedures Procedures (including critical care time)  Medications Ordered in UC Medications - No data to display  Initial Impression / Assessment and Plan / UC Course  I have reviewed the triage vital signs and the nursing notes.  Pertinent labs & imaging results that were  available during my care of the patient were reviewed by me and considered in my medical decision making (see chart for details).   Chest pain, shortness of breath.  Patient is well-appearing and  his exam is reassuring.  EKG shows sinus rhythm, rate 69, no ST elevation, compared to previous from 10/21/2020.  Discussed limitations of evaluation of chest pain and shortness of breath in an urgent care setting.  Instructed patient to go to the ED if he has acute chest pain or other concerning symptoms.  Instructed him to schedule a follow-up appointment tomorrow with his PCP or cardiologist.  He agrees to plan of care.   Final Clinical Impressions(s) / UC Diagnoses   Final diagnoses:  Chest pain, unspecified type  Shortness of breath     Discharge Instructions     Go to the emergency department if you have acute chest pain or other concerning symptoms.    Schedule a follow-up appointment with your primary care provider or cardiologist tomorrow.        ED Prescriptions    None     PDMP not reviewed this encounter.   Mickie Bail, NP 12/08/20 [phone]

## 2020-12-08 NOTE — ED Triage Notes (Signed)
Patient c/o Chest Tightness and SOB x 3 weeks.   Patient endorses increased pain today.   Patient endorses a headache yesterday.   Patient endorses an episode of dizziness " after cutting grass on Saturday".   Patient denies a LOC.   Patient denies history of HTN or DM.   Patient has not used any medications for symptoms.

## 2020-12-08 NOTE — Discharge Instructions (Signed)
Go to the emergency department if you have acute chest pain or other concerning symptoms.    Schedule a follow-up appointment with your primary care provider or cardiologist tomorrow.

## 2021-02-22 DIAGNOSIS — R0789 Other chest pain: Secondary | ICD-10-CM | POA: Diagnosis not present

## 2021-02-22 DIAGNOSIS — R001 Bradycardia, unspecified: Secondary | ICD-10-CM | POA: Diagnosis not present

## 2021-02-22 DIAGNOSIS — R002 Palpitations: Secondary | ICD-10-CM | POA: Diagnosis not present

## 2021-02-22 DIAGNOSIS — R000 Tachycardia, unspecified: Secondary | ICD-10-CM | POA: Diagnosis not present

## 2021-05-06 ENCOUNTER — Encounter: Payer: Self-pay | Admitting: Nurse Practitioner

## 2021-05-06 ENCOUNTER — Other Ambulatory Visit: Payer: Self-pay

## 2021-05-06 ENCOUNTER — Ambulatory Visit: Payer: BC Managed Care – PPO | Admitting: Nurse Practitioner

## 2021-05-06 VITALS — BP 126/90 | HR 82 | Temp 98.1°F | Resp 18

## 2021-05-06 DIAGNOSIS — Z20822 Contact with and (suspected) exposure to covid-19: Secondary | ICD-10-CM

## 2021-05-06 DIAGNOSIS — J40 Bronchitis, not specified as acute or chronic: Secondary | ICD-10-CM

## 2021-05-06 DIAGNOSIS — J0100 Acute maxillary sinusitis, unspecified: Secondary | ICD-10-CM

## 2021-05-06 LAB — POC COVID19 BINAXNOW: SARS Coronavirus 2 Ag: NEGATIVE

## 2021-05-06 MED ORDER — ALBUTEROL SULFATE HFA 108 (90 BASE) MCG/ACT IN AERS: 2.0000 | INHALATION_SPRAY | Freq: Four times a day (QID) | RESPIRATORY_TRACT | 0 refills | Status: DC | PRN

## 2021-05-06 MED ORDER — AMOXICILLIN-POT CLAVULANATE 875-125 MG PO TABS: 1.0000 | ORAL_TABLET | Freq: Two times a day (BID) | ORAL | 0 refills | Status: AC

## 2021-05-06 NOTE — Progress Notes (Signed)
   Subjective:    Patient ID: William Stuart, male    DOB: March 27, 1981, 40 y.o.   MRN: [id number]  HPI  40 year old male presenting to [facility] with complaints of cough and sinus congestion for the past two weeks. Patient started with head congestion that progressed to a productive cough that is now causing him to wheeze.   He has a history of asthma as a child and has not used an inhaler recently but is familiar with how to use one.   He has not been using anything over the counter for management of symptoms.   Vitals:   05/06/21 1310  BP: 126/90  Pulse: 82  Resp: 18  Temp: 98.1 F (36.7 C)  SpO2: 97%     Review of Systems  Constitutional:  Positive for fatigue.  HENT:  Positive for congestion and sinus pressure.   Eyes: Negative.   Respiratory:  Positive for cough.   Cardiovascular: Negative.   Gastrointestinal: Negative.   Genitourinary: Negative.   Musculoskeletal: Negative.   Neurological: Negative.   Hematological: Negative.   Psychiatric/Behavioral: Negative.        Objective:   Physical Exam HENT:     Head: Normocephalic.     Right Ear: Tympanic membrane, ear canal and external ear normal.     Left Ear: Tympanic membrane, ear canal and external ear normal.     Nose: Congestion present.     Mouth/Throat:     Mouth: Mucous membranes are moist.  Eyes:     Pupils: Pupils are equal, round, and reactive to light.  Cardiovascular:     Rate and Rhythm: Normal rate and regular rhythm.     Heart sounds: Normal heart sounds.  Pulmonary:     Effort: Pulmonary effort is normal.     Breath sounds: Wheezing and rhonchi present.  Musculoskeletal:     Cervical back: Normal range of motion.  Skin:    General: Skin is warm.     Capillary Refill: Capillary refill takes less than 2 seconds.  Neurological:     Mental Status: He is alert.  Psychiatric:        Mood and Affect: Mood normal.     Recent Results (from the past 2160 hour(s))  POC  COVID-19     Status: Normal   Collection Time: 05/06/21  1:31 PM  Result Value Ref Range   SARS Coronavirus 2 Ag Negative Negative    Comment: Vac x 2 boost x 2, Pt aware of neg POC. Has appt with Lavone Neri FNP for further eval.        Assessment & Plan:   1. Acute non-recurrent maxillary sinusitis  - amoxicillin-clavulanate (AUGMENTIN) 875-125 MG tablet; Take 1 tablet by mouth 2 (two) times daily for 7 days. Take with food  Dispense: 14 tablet; Refill: 0  2. Bronchitis  - albuterol (VENTOLIN HFA) 108 (90 Base) MCG/ACT inhaler; Inhale 2 puffs into the lungs every 6 (six) hours as needed for wheezing or shortness of breath.  Dispense: 8 g; Refill: 0  3. Encounter for screening laboratory testing for COVID-19 virus  - POC COVID-19 (negative)     Advised otc use of Mucinex to help mobilize secretions.  Push fluids rest RTC for new or worsening symptoms as discussed.

## 2021-05-11 ENCOUNTER — Telehealth: Payer: Self-pay | Admitting: Internal Medicine

## 2021-05-11 ENCOUNTER — Telehealth: Payer: BC Managed Care – PPO | Admitting: Physician Assistant

## 2021-05-11 DIAGNOSIS — R631 Polydipsia: Secondary | ICD-10-CM

## 2021-05-11 DIAGNOSIS — R5381 Other malaise: Secondary | ICD-10-CM

## 2021-05-11 DIAGNOSIS — R5383 Other fatigue: Secondary | ICD-10-CM

## 2021-05-11 NOTE — Telephone Encounter (Signed)
Patient has a virtual visit today. Patient scheduled for a follow-up visit with Dr. Alphonsus Sias 05/14/21 at 11:00 am. Duplicate encounter.

## 2021-05-11 NOTE — Patient Instructions (Signed)
William Stuart, thank you for joining Margaretann Loveless, PA-C for today's virtual visit.  While this provider is not your primary care provider (PCP), if your PCP is located in our provider database this encounter information will be shared with them immediately following your visit.  Consent: (Patient) William Stuart provided verbal consent for this virtual visit at the beginning of the encounter.  Current Medications:  Current Outpatient Medications:    albuterol (VENTOLIN HFA) 108 (90 Base) MCG/ACT inhaler, Inhale 2 puffs into the lungs every 6 (six) hours as needed for wheezing or shortness of breath., Disp: 8 g, Rfl: 0   amoxicillin-clavulanate (AUGMENTIN) 875-125 MG tablet, Take 1 tablet by mouth 2 (two) times daily for 7 days. Take with food, Disp: 14 tablet, Rfl: 0   metoprolol succinate (TOPROL-XL) 25 MG 24 hr tablet, Take 25 mg by mouth., Disp: , Rfl:    metoprolol succinate (TOPROL-XL) 25 MG 24 hr tablet, TAKE 1 TABLET(25 MG) BY MOUTH EVERY DAY, Disp: , Rfl:    omeprazole (PRILOSEC) 20 MG capsule, TAKE 1 CAPSULE(20 MG) BY MOUTH DAILY, Disp: 90 capsule, Rfl: 3   Medications ordered in this encounter:  No orders of the defined types were placed in this encounter.    *If you need refills on other medications prior to your next appointment, please contact your pharmacy*  Follow-Up: Call back or seek an in-person evaluation if the symptoms worsen or if the condition fails to improve as anticipated.  Other Instructions Preventing Type 2 Diabetes Mellitus Type 2 diabetes, also called type 2 diabetes mellitus, is a long-term (chronic) disease that affects sugar (glucose) levels in your blood. Normally, a hormone called insulin allows glucose to enter cells in your body. The cells use glucose for energy. With type 2 diabetes, you will have one or both of these problems: Your pancreas does not make enough insulin. Cells in your body do not respond properly to insulin that your body  makes (insulin resistance). Insulin resistance or lack of insulin causes extra glucose to build up in the blood instead of going into cells. As a result, high blood glucose (hyperglycemia) develops. That can cause many complications. Being overweight or obese and having an inactive (sedentary) lifestyle can increase your risk for diabetes. Type 2 diabetes can be delayed or prevented by making certain nutrition and lifestyle changes. How can this condition affect me? If you do not take steps to prevent diabetes, your blood glucose levels may keep increasing over time. Too much glucose in your blood for a long time can damage your blood vessels, heart, kidneys, nerves, and eyes. Type 2 diabetes can lead to chronic health problems and complications, such as: Heart disease. Stroke. Blindness. Kidney disease. Depression. Poor circulation in your feet and legs. In severe cases, a foot or leg may need to be surgically removed (amputated). What can increase my risk? You may be more likely to develop type 2 diabetes if you: Have type 2 diabetes in your family. Are overweight or obese. Have a sedentary lifestyle. Have insulin resistance or a history of prediabetes. Have a history of pregnancy-related (gestational) diabetes or polycystic ovary syndrome (PCOS). What actions can I take to prevent this? It can be difficult to recognize signs of type 2 diabetes. Taking action to prevent the disease before you develop symptoms is the best way to avoid possible damage to your body. Making certain nutrition and lifestyle changes may prevent or delay the disease and related health problems. Nutrition  Eat  healthy meals and snacks regularly. Do not skip meals. Fruit or a handful of nuts is a healthy snack between meals. Drink water throughout the day. Avoid drinks that contain added sugar, such as soda or sweetened tea. Drink enough fluid to keep your urine pale yellow. Follow instructions from your health care  provider about eating or drinking restrictions. Limit the amount of food you eat by: Managing how much you eat at a time (portion size). Checking food labels for the serving sizes of food. Using a kitchen scale to weigh amounts of food. Saut or steam food instead of frying it. Cook with water or broth instead of oils or butter. Limit saturated fat and salt (sodium) in your diet. Have no more than 1 tsp (2,400 mg) of sodium a day. If you have heart disease or high blood pressure, use less than ? tsp (1,500 mg) of sodium a day. Lifestyle  Lose weight if needed and as told. Your health care provider can determine how much weight loss is best for you and can help you lose weight safely. If you are overweight or obese, you may be told to lose at least 5?7% of your body weight. Manage blood pressure, cholesterol, and stress. Your health care provider will help determine the best treatment for you. Do not use any products that contain nicotine or tobacco. These products include cigarettes, chewing tobacco, and vaping devices, such as e-cigarettes. If you need help quitting, ask your health care provider. Activity  Do physical activity that makes your heart beat faster and makes you sweat (moderate intensity). Do this for at least 30 minutes on at least 5 days of the week, or as much as told by your health care provider. Ask your health care provider what activities are safe for you. A mix of activities may be best, such as walking, swimming, cycling, and strength training. Try to add physical activity into your day. For example: Park your car farther away than usual so that you walk more. Take a walk during your lunch break. Use stairs instead of elevators or escalators. Walk or bike to work instead of driving. Alcohol use If you drink alcohol: Limit how much you have to: 0?1 drink a day for women who are not pregnant. 0?2 drinks a day for men. Know how much alcohol is in your drink. In the  [place] one drink equals one 12 oz bottle of beer (355 mL), one 5 oz glass of wine (148 mL), or one 1 oz glass of hard liquor (44 mL). General information Talk with your health care provider about your risk factors and how you can reduce your risk for diabetes. Have your blood glucose tested regularly, as told by your health care provider. Get screening tests as told by your health care provider. You may have these regularly, especially if you have certain risk factors for type 2 diabetes. Make an appointment with a registered dietitian. This diet and nutrition specialist can help you make a healthy eating plan and help you understand portion sizes and food labels. Where to find support Ask your health care provider to recommend a registered dietitian, a certified diabetes care and education specialist, or a weight loss program. Look for local or online weight loss groups. Join a gym, fitness club, or outdoor activity group, such as a walking club. Where to find more information For help and guidance and to learn more about diabetes and diabetes prevention, visit: American Diabetes Association (ADA): www.diabetes.[employer] of Diabetes  and Digestive and Kidney Diseases: CarFlippers.tn To learn more about healthy eating, visit: U.S. Department of Agriculture Architect): https://ball-collins.biz/ Office of Disease Prevention and Health Promotion (ODPHP): ResearchName.uy Summary You can delay or prevent type 2 diabetes by eating healthy foods, losing weight if needed, and increasing your physical activity. Talk with your health care provider about your risk factors for type 2 diabetes and how you can reduce your risk. It can be difficult to recognize the signs of type 2 diabetes. The best way to avoid possible damage to your body is to take action to prevent the disease before you develop symptoms. Get screening tests as told by your health care provider. This information is not intended to  replace advice given to you by your health care provider. Make sure you discuss any questions you have with your health care provider. Document Revised: 10/19/2020 Document Reviewed: 10/19/2020 Elsevier Patient Education  2022 [employer].    If you have been instructed to have an in-person evaluation today at a local Urgent Care facility, please use the link below. It will take you to a list of all of our available [facility], including address, phone number and hours of operation. Please do not delay care.  [facility]  If you or a family member do not have a primary care provider, use the link below to schedule a visit and establish care. When you choose a [facility] primary care physician or advanced practice provider, you gain a long-term partner in health. Find a Primary Care Provider  Learn more about [facility]'s in-office and virtual care options: [facility] - Get Care Now

## 2021-05-11 NOTE — Telephone Encounter (Signed)
Patient had a virtual visit today. Patient has a follow-up appointment scheduled with Dr. Alphonsus Sias 05/14/21 at 11:00 am.

## 2021-05-11 NOTE — Progress Notes (Signed)
Virtual Visit Consent   William Stuart, you are scheduled for a virtual visit with a [facility] provider today.     Just as with appointments in the office, your consent must be obtained to participate.  Your consent will be active for this visit and any virtual visit you may have with one of our providers in the next 365 days.     If you have a MyChart account, a copy of this consent can be sent to you electronically.  All virtual visits are billed to your insurance company just like a traditional visit in the office.    As this is a virtual visit, video technology does not allow for your provider to perform a traditional examination.  This may limit your provider's ability to fully assess your condition.  If your provider identifies any concerns that need to be evaluated in person or the need to arrange testing (such as labs, EKG, etc.), we will make arrangements to do so.     Although advances in technology are sophisticated, we cannot ensure that it will always work on either your end or our end.  If the connection with a video visit is poor, the visit may have to be switched to a telephone visit.  With either a video or telephone visit, we are not always able to ensure that we have a secure connection.     I need to obtain your verbal consent now.   Are you willing to proceed with your visit today?    William Stuart has provided verbal consent on 05/11/2021 for a virtual visit (video or telephone).   Margaretann Loveless, PA-C   Date: 05/11/2021 10:06 AM   Virtual Visit via Video Note   I, Margaretann Loveless, connected with  William Stuart  ([id number], October 23, 1980) on 05/11/21 at 10:00 AM EDT by a video-enabled telemedicine application and verified that I am speaking with the correct person using two identifiers.  Location: Patient: Virtual Visit Location Patient: Home Provider: Virtual Visit Location Provider: Home Office   I discussed the limitations of evaluation and  management by telemedicine and the availability of in person appointments. The patient expressed understanding and agreed to proceed.    History of Present Illness: William Stuart is a 40 y.o. who identifies as a male who was assigned male at birth, and is being seen today for feeling unwell. Patient has been having feelings of general malaise, fatigue, shakiness, increased thirst, and increased urination. He is interested in general blood work, worried about possible diabetes. No family history of diabetes known.   Problems:  Patient Active Problem List   Diagnosis Date Noted   Dry mouth, unspecified 12/20/2019   Preventative health care 03/19/2019   GERD (gastroesophageal reflux disease) 02/14/2018   OSA (obstructive sleep apnea) 07/24/2017   Chronic tension-type headache, not intractable 07/29/2016   GAD (generalized anxiety disorder) 04/30/2007   ALLERGIC RHINITIS 02/27/2007   Allergic asthma 02/27/2007    Allergies: No Known Allergies Medications:  Current Outpatient Medications:    albuterol (VENTOLIN HFA) 108 (90 Base) MCG/ACT inhaler, Inhale 2 puffs into the lungs every 6 (six) hours as needed for wheezing or shortness of breath., Disp: 8 g, Rfl: 0   amoxicillin-clavulanate (AUGMENTIN) 875-125 MG tablet, Take 1 tablet by mouth 2 (two) times daily for 7 days. Take with food, Disp: 14 tablet, Rfl: 0   metoprolol succinate (TOPROL-XL) 25 MG 24 hr tablet, Take 25 mg by mouth., Disp: , Rfl:  metoprolol succinate (TOPROL-XL) 25 MG 24 hr tablet, TAKE 1 TABLET(25 MG) BY MOUTH EVERY DAY, Disp: , Rfl:    omeprazole (PRILOSEC) 20 MG capsule, TAKE 1 CAPSULE(20 MG) BY MOUTH DAILY, Disp: 90 capsule, Rfl: 3  Observations/Objective: Patient is well-developed, well-nourished in no acute distress.  Resting comfortably at home.  Head is normocephalic, atraumatic.  No labored breathing.  Speech is clear and coherent with logical content.  Patient is alert and oriented at baseline.     Assessment and Plan: 1. Malaise and fatigue  2. Increased thirst  - Patient desiring labs and diabetes evaluation - Advised we are unable to follow labs through this platform currently - Advised to f/u with PCP  Follow Up Instructions: I discussed the assessment and treatment plan with the patient. The patient was provided an opportunity to ask questions and all were answered. The patient agreed with the plan and demonstrated an understanding of the instructions.  A copy of instructions were sent to the patient via MyChart unless otherwise noted below.    The patient was advised to call back or seek an in-person evaluation if the symptoms worsen or if the condition fails to improve as anticipated.  Time:  I spent 8 minutes with the patient via telehealth technology discussing the above problems/concerns.    Margaretann Loveless, PA-C

## 2021-05-11 NOTE — Telephone Encounter (Signed)
Spoke to pt. Advised him to fast 4 hrs prior to his appt.

## 2021-05-14 ENCOUNTER — Encounter: Payer: Self-pay | Admitting: Internal Medicine

## 2021-05-14 ENCOUNTER — Other Ambulatory Visit: Payer: Self-pay

## 2021-05-14 ENCOUNTER — Ambulatory Visit: Payer: BC Managed Care – PPO | Admitting: Internal Medicine

## 2021-05-14 VITALS — BP 122/76 | HR 70 | Temp 97.1°F | Ht 74.0 in | Wt 300.0 lb

## 2021-05-14 DIAGNOSIS — G4733 Obstructive sleep apnea (adult) (pediatric): Secondary | ICD-10-CM

## 2021-05-14 DIAGNOSIS — R5383 Other fatigue: Secondary | ICD-10-CM

## 2021-05-14 DIAGNOSIS — E669 Obesity, unspecified: Secondary | ICD-10-CM | POA: Diagnosis not present

## 2021-05-14 LAB — COMPREHENSIVE METABOLIC PANEL
ALT: 35 U/L (ref 0–53)
AST: 40 U/L — ABNORMAL HIGH (ref 0–37)
Albumin: 4.2 g/dL (ref 3.5–5.2)
Alkaline Phosphatase: 70 U/L (ref 39–117)
BUN: 11 mg/dL (ref 6–23)
CO2: 28 mEq/L (ref 19–32)
Calcium: 9.4 mg/dL (ref 8.4–10.5)
Chloride: 99 mEq/L (ref 96–112)
Creatinine, Ser: 1.03 mg/dL (ref 0.40–1.50)
GFR: 91.31 mL/min (ref >60.00)
Glucose, Bld: 85 mg/dL (ref 70–99)
Potassium: 4.9 mEq/L (ref 3.5–5.1)
Sodium: 134 mEq/L — ABNORMAL LOW (ref 135–145)
Total Bilirubin: 0.7 mg/dL (ref 0.2–1.2)
Total Protein: 7.8 g/dL (ref 6.0–8.3)

## 2021-05-14 LAB — CBC
HCT: 40 % (ref 39.0–52.0)
Hemoglobin: 13.2 g/dL (ref 13.0–17.0)
MCHC: 33 g/dL (ref 30.0–36.0)
MCV: 93.9 fl (ref 78.0–100.0)
Platelets: 262 10*3/uL (ref 150.0–400.0)
RBC: 4.26 Mil/uL (ref 4.22–5.81)
RDW: 13.5 % (ref 11.5–15.5)
WBC: 5.5 10*3/uL (ref 4.0–10.5)

## 2021-05-14 LAB — SEDIMENTATION RATE: Sed Rate: 24 mm/hr — ABNORMAL HIGH (ref 0–15)

## 2021-05-14 LAB — VITAMIN B12: Vitamin B-12: 316 pg/mL (ref 211–911)

## 2021-05-14 LAB — HEMOGLOBIN A1C: Hgb A1c MFr Bld: 5.3 % (ref 4.6–6.5)

## 2021-05-14 LAB — T4, FREE: Free T4: 0.69 ng/dL (ref 0.60–1.60)

## 2021-05-14 NOTE — Assessment & Plan Note (Signed)
Some increased thirst, etc as well Doubt diabetes but possible Will check labs Nothing worrisome on exam

## 2021-05-14 NOTE — Patient Instructions (Signed)
Please look up and try the [employer]

## 2021-05-14 NOTE — Assessment & Plan Note (Signed)
Had mild OSA on regular test--worse in REM Didn't do well with CPAP

## 2021-05-14 NOTE — Assessment & Plan Note (Signed)
Weight has been going up Recommended true carb restriction (like [employer])

## 2021-05-14 NOTE — Progress Notes (Signed)
Subjective:    Patient ID: William Stuart, male    DOB: 05-Mar-1981, 40 y.o.   MRN: [id number]  HPI Here due to concerns about possible diabetes This visit occurred during the SARS-CoV-2 public health emergency.  Safety protocols were in place, including screening questions prior to the visit, additional usage of staff PPE, and extensive cleaning of exam room while observing appropriate contact time as indicated for disinfecting solutions.   Has been having increased sensation of hunger and thirst Drinking more water Has funny feeling in head Fatigue and lightheadedness Thought vision was changing --but had eye appt yesterday and stable Definite decreased in satiety after eating  Current Outpatient Medications on File Prior to Visit  Medication Sig Dispense Refill   albuterol (VENTOLIN HFA) 108 (90 Base) MCG/ACT inhaler Inhale 2 puffs into the lungs every 6 (six) hours as needed for wheezing or shortness of breath. 8 g 0   metoprolol succinate (TOPROL-XL) 25 MG 24 hr tablet TAKE 1 TABLET(25 MG) BY MOUTH EVERY DAY     omeprazole (PRILOSEC) 20 MG capsule TAKE 1 CAPSULE(20 MG) BY MOUTH DAILY 90 capsule 3   No current facility-administered medications on file prior to visit.    Not on File  Past Medical History:  Diagnosis Date   Allergy    Anxiety    Asthma    GERD (gastroesophageal reflux disease)     Past Surgical History:  Procedure Laterality Date   ORCHIECTOMY     L orchiectomy and R orchiopexy for torsion   2002   PILONIDAL CYST EXCISION  2000 & 2001   3 times    Family History  Problem Relation Age of Onset   Migraines Mother    Celiac disease Mother    Crohn's disease Mother    Migraines Father    Celiac disease Father    Cancer Neg Hx    Colon cancer Neg Hx    Esophageal cancer Neg Hx    Pancreatic cancer Neg Hx    Rectal cancer Neg Hx    Stomach cancer Neg Hx     Social History   Socioeconomic History   Marital status: Married    Spouse name: Not  on file   Number of children: 2   Years of education: Not on file   Highest education level: Not on file  Occupational History   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    Employer: [employer]  Tobacco Use   Smoking status: Never   Smokeless tobacco: Never  Vaping Use   Vaping Use: Never used  Substance and Sexual Activity   Alcohol use: Yes    Comment: occasional   Drug use: No   Sexual activity: Not on file  Other Topics Concern   Not on file  Social History Narrative   Not on file   Social Determinants of Health   Financial Resource Strain: Not on file  Food Insecurity: Not on file  Transportation Needs: Not on file  Physical Activity: Not on file  Stress: Not on file  Social Connections: Not on file  Intimate Partner Violence: Not on file   Review of Systems Weight is stable Not sleeping great---does well at first (like for 4 hours)---then spotty Some daytime somnolence Wife never noted apnea    Objective:   Physical Exam Constitutional:      Appearance: Normal appearance.  HENT:     Mouth/Throat:     Pharynx: No oropharyngeal exudate or posterior oropharyngeal erythema.  Cardiovascular:     Rate and Rhythm: Normal rate and regular rhythm.     Pulses: Normal pulses.     Heart sounds: No murmur heard.   No gallop.  Pulmonary:     Effort: Pulmonary effort is normal.     Breath sounds: Normal breath sounds. No wheezing or rales.  Abdominal:     Palpations: Abdomen is soft.     Tenderness: There is no abdominal tenderness.     Comments: No HSM  Musculoskeletal:     Cervical back: Neck supple.     Right lower leg: No edema.     Left lower leg: No edema.  Lymphadenopathy:     Cervical: No cervical adenopathy.  Neurological:     General: No focal deficit present.     Mental Status: He is alert.  Psychiatric:        Mood and Affect: Mood normal.        Behavior: Behavior normal.           Assessment & Plan:

## 2021-06-01 ENCOUNTER — Emergency Department
Admission: EM | Admit: 2021-06-01 | Discharge: 2021-06-01 | Disposition: A | Discharge status: Home / Self Care | Payer: BC Managed Care – PPO | Attending: Emergency Medicine | Admitting: Emergency Medicine

## 2021-06-01 ENCOUNTER — Ambulatory Visit
Admission: EM | Admit: 2021-06-01 | Discharge: 2021-06-01 | Payer: BC Managed Care – PPO | Attending: Urgent Care | Admitting: Urgent Care

## 2021-06-01 ENCOUNTER — Encounter: Payer: Self-pay | Admitting: Emergency Medicine

## 2021-06-01 ENCOUNTER — Other Ambulatory Visit: Payer: Self-pay

## 2021-06-01 ENCOUNTER — Other Ambulatory Visit: Payer: BC Managed Care – PPO

## 2021-06-01 ENCOUNTER — Emergency Department: Payer: BC Managed Care – PPO

## 2021-06-01 DIAGNOSIS — J45909 Unspecified asthma, uncomplicated: Secondary | ICD-10-CM | POA: Diagnosis not present

## 2021-06-01 DIAGNOSIS — R002 Palpitations: Secondary | ICD-10-CM | POA: Diagnosis not present

## 2021-06-01 DIAGNOSIS — R000 Tachycardia, unspecified: Secondary | ICD-10-CM

## 2021-06-01 DIAGNOSIS — R9431 Abnormal electrocardiogram [ECG] [EKG]: Secondary | ICD-10-CM

## 2021-06-01 LAB — CBC WITH DIFFERENTIAL/PLATELET
Abs Immature Granulocytes: 0.01 10*3/uL (ref 0.00–0.07)
Basophils Absolute: 0.1 10*3/uL (ref 0.0–0.1)
Basophils Relative: 1 %
Eosinophils Absolute: 0 10*3/uL (ref 0.0–0.5)
Eosinophils Relative: 1 %
HCT: 37.8 % — ABNORMAL LOW (ref 39.0–52.0)
Hemoglobin: 12.9 g/dL — ABNORMAL LOW (ref 13.0–17.0)
Immature Granulocytes: 0 %
Lymphocytes Relative: 29 %
Lymphs Abs: 1.9 10*3/uL (ref 0.7–4.0)
MCH: 31.6 pg (ref 26.0–34.0)
MCHC: 34.1 g/dL (ref 30.0–36.0)
MCV: 92.6 fL (ref 80.0–100.0)
Monocytes Absolute: 0.7 10*3/uL (ref 0.1–1.0)
Monocytes Relative: 11 %
Neutro Abs: 3.7 10*3/uL (ref 1.7–7.7)
Neutrophils Relative %: 58 %
Platelets: 224 10*3/uL (ref 150–400)
RBC: 4.08 MIL/uL — ABNORMAL LOW (ref 4.22–5.81)
RDW: 12.9 % (ref 11.5–15.5)
WBC: 6.5 10*3/uL (ref 4.0–10.5)
nRBC: 0 % (ref 0.0–0.2)

## 2021-06-01 LAB — MAGNESIUM: Magnesium: 1.7 mg/dL (ref 1.7–2.4)

## 2021-06-01 LAB — BASIC METABOLIC PANEL
Anion gap: 8 (ref 5–15)
BUN: 11 mg/dL (ref 6–20)
CO2: 26 mmol/L (ref 22–32)
Calcium: 8.8 mg/dL — ABNORMAL LOW (ref 8.9–10.3)
Chloride: 101 mmol/L (ref 98–111)
Creatinine, Ser: 1 mg/dL (ref 0.61–1.24)
GFR, Estimated: >60 mL/min (ref >60)
Glucose, Bld: 110 mg/dL — ABNORMAL HIGH (ref 70–99)
Potassium: 4 mmol/L (ref 3.5–5.1)
Sodium: 135 mmol/L (ref 135–145)

## 2021-06-01 LAB — TROPONIN I (HIGH SENSITIVITY)
Troponin I (High Sensitivity): 2 ng/L (ref <18)
Troponin I (High Sensitivity): 3 ng/L (ref <18)

## 2021-06-01 LAB — TSH: TSH: 1.419 u[IU]/mL (ref 0.350–4.500)

## 2021-06-01 MED ORDER — METOPROLOL SUCCINATE ER 25 MG PO TB24: 25.0000 mg | ORAL_TABLET | Freq: Every day | ORAL | 0 refills | Status: DC

## 2021-06-01 NOTE — ED Provider Notes (Signed)
Emergency Medicine Provider Triage Evaluation Note  William Stuart , a 40 y.o. male  was evaluated in triage.  Pt complains of palpitations and sensation of his heart racing since yesterday, notes heart rate as high as 160 with exertion on his apple watch.  He denies any associated chest pain or shortness of breath.  Review of Systems  Positive: Palpitations. Negative: Chest Pain, shortness of breath, fevers, cough.  Physical Exam  BP (!) 137/100 (BP Location: Left Arm)   Pulse 100   Temp 97.8 F (36.6 C) (Oral)   Resp 18   Ht 6\' 2"  (1.88 m)   Wt 136.1 kg   SpO2 99%   BMI 38.52 kg/m  Gen:   Awake, no distress  Resp:  Normal effort, lungs clear to auscultation bilaterally. MSK:   Moves extremities without difficulty, 2+ radial pulses bilaterally. Other:  Regular rate and rhythm, no murmurs, rubs, or gallops.  Medical Decision Making  Medically screening exam initiated at 1:57 PM.  Appropriate orders placed.  William Stuart was informed that the remainder of the evaluation will be completed by another provider, this initial triage assessment does not replace that evaluation, and the importance of remaining in the ED until their evaluation is complete.  40 year old male presenting with palpitations that are worse with exertion starting yesterday, denies associated chest pain or shortness of breath.  We will check EKG, labs, and chest x-ray.   24, MD 06/01/21 1400

## 2021-06-01 NOTE — ED Triage Notes (Signed)
Pt presents with elevated HR that started today. Pt has had issues with this in the past and has seen a cardiologist.

## 2021-06-01 NOTE — Discharge Instructions (Signed)
Please report to the hospital now as you are in need of further evaluation that we can provide in the urgent care setting including cardiac markers, serial EKGs.  We have decided not to send you by ambulance as you are not having any kind of chest pain but please present to the [facility] now.

## 2021-06-01 NOTE — ED Provider Notes (Signed)
Vivien Rossetti   MRN: [id number] DOB: Dec 02, 1980  Subjective:   William Stuart is a 40 y.o. male with pmh of OSA, palpitations, tachycardia, bradycardia presenting for acute onset today of recurrent heart racing, palpitations.  Denies fever, diaphoresis, neck or jaw pain, chest pain, left arm pain, nausea, vomiting, abdominal pain.  Has had these problems before, is followed by cardiology.  Last office visit with cardiology through [facility] showed an EKG that had sinus bradycardia.  The recommendation was to continue metoprolol therapy.  Also was advised to avoid a lot of caffeinated drinks, energy drinks.  The patient's echocardiogram from 2019 revealed normal LV systolic function with mild LVH, mild valvular regurgitation and estimated EF of 45-50%. The ETT at that time revealed a normal treadmill ECG without evidence of ischemia or arrhythmias.  No current facility-administered medications for this encounter.  Current Outpatient Medications:    metoprolol succinate (TOPROL-XL) 25 MG 24 hr tablet, TAKE 1 TABLET(25 MG) BY MOUTH EVERY DAY, Disp: , Rfl:    omeprazole (PRILOSEC) 20 MG capsule, TAKE 1 CAPSULE(20 MG) BY MOUTH DAILY, Disp: 90 capsule, Rfl: 3   albuterol (VENTOLIN HFA) 108 (90 Base) MCG/ACT inhaler, Inhale 2 puffs into the lungs every 6 (six) hours as needed for wheezing or shortness of breath., Disp: 8 g, Rfl: 0   Not on File  Past Medical History:  Diagnosis Date   Allergy    Anxiety    Asthma    GERD (gastroesophageal reflux disease)      Past Surgical History:  Procedure Laterality Date   ORCHIECTOMY     L orchiectomy and R orchiopexy for torsion   2002   PILONIDAL CYST EXCISION  2000 & 2001   3 times    Family History  Problem Relation Age of Onset   Migraines Mother    Celiac disease Mother    Crohn's disease Mother    Migraines Father    Celiac disease Father    Cancer Neg Hx    Colon cancer Neg Hx    Esophageal cancer Neg Hx    Pancreatic  cancer Neg Hx    Rectal cancer Neg Hx    Stomach cancer Neg Hx     Social History   Tobacco Use   Smoking status: Never   Smokeless tobacco: Never  Vaping Use   Vaping Use: Never used  Substance Use Topics   Alcohol use: Yes    Comment: occasional   Drug use: No    ROS   Objective:   Vitals: BP (!) 150/94 (BP Location: Left Wrist)   Pulse (!) 110   Temp 98.1 F (36.7 C) (Oral)   SpO2 96%   Physical Exam Constitutional:      General: He is not in acute distress.    Appearance: Normal appearance. He is well-developed. He is obese. He is not ill-appearing, toxic-appearing or diaphoretic.  HENT:     Head: Normocephalic and atraumatic.     Right Ear: External ear normal.     Left Ear: External ear normal.     Nose: Nose normal.     Mouth/Throat:     Mouth: Mucous membranes are moist.     Pharynx: Oropharynx is clear.  Eyes:     General: No scleral icterus.    Extraocular Movements: Extraocular movements intact.     Pupils: Pupils are equal, round, and reactive to light.  Cardiovascular:     Rate and Rhythm: Regular rhythm. Tachycardia present.  Heart sounds: Normal heart sounds. No murmur heard.   No friction rub. No gallop.  Pulmonary:     Effort: Pulmonary effort is normal. No respiratory distress.     Breath sounds: Normal breath sounds. No stridor. No wheezing, rhonchi or rales.  Neurological:     Mental Status: He is alert and oriented to person, place, and time.  Psychiatric:        Mood and Affect: Mood normal.        Behavior: Behavior normal.        Thought Content: Thought content normal.    ED ECG REPORT   Date: 06/01/2021  EKG Time: 1:17 PM  Rate: 110bpm  Rhythm: sinus tachycardia,  sinus tachycardia  Axis: Normal  Intervals:none  ST&T Change: T wave inversion in leads I, II, aVL, aVF  Narrative Interpretation: Sinus tachycardia at 110 bpm with T wave inversion in inferior leads as above, starkly different from previous  EKG.   Assessment and Plan :   PDMP not reviewed this encounter.  1. Nonspecific abnormal electrocardiogram (ECG) (EKG)   2. Sinus tachycardia    Discussed transport to the hospital by EMS but as patient is largely asymptomatic apart from the heart racing we both agreed to have him go by personal vehicle now.  He needs further work-up including cardiac markers, serial EKGs given his EKG changes and heart racing sensation.  Contracts for safety and will have to the emergency room now.   Wallis Bamberg, PA-C 06/01/21 1330

## 2021-06-01 NOTE — ED Triage Notes (Signed)
Pt here with an elevated HR and a abnormal EKG. Pt denies N/V/D or SOB. Pt states that he has intermittent diaphoresis but no major symptoms. Pt in NAD int triage.

## 2021-06-01 NOTE — ED Provider Notes (Signed)
[facility] Emergency Department Provider Note    Event Date/Time   First MD Initiated Contact with Patient 06/01/21 1355     (approximate)  I have reviewed the triage vital signs and the nursing notes.   HISTORY  Chief Complaint Abnormal ECG    HPI William Stuart is a 40 y.o. male below listed past medical history presents to the ER from urgent care due to palpitations and reported abnormal EKG.  He adamantly denies any chest pain pressure or shortness of breath or discomfort.  No leg swelling.  No history of DVT or PE.  States that he did run out of his metoprolol XL over the weekend started having palpitations and racing heart since then.  States he is also doing quite a bit of stress at work but that is not out of the ordinary.  Past Medical History:  Diagnosis Date   Allergy    Anxiety    Asthma    GERD (gastroesophageal reflux disease)    Family History  Problem Relation Age of Onset   Migraines Mother    Celiac disease Mother    Crohn's disease Mother    Migraines Father    Celiac disease Father    Cancer Neg Hx    Colon cancer Neg Hx    Esophageal cancer Neg Hx    Pancreatic cancer Neg Hx    Rectal cancer Neg Hx    Stomach cancer Neg Hx    Past Surgical History:  Procedure Laterality Date   ORCHIECTOMY     L orchiectomy and R orchiopexy for torsion   2002   PILONIDAL CYST EXCISION  2000 & 2001   3 times   Patient Active Problem List   Diagnosis Date Noted   Obesity (BMI 30-39.9) 05/14/2021   Dry mouth, unspecified 12/20/2019   Fatigue 05/10/2019   Preventative health care 03/19/2019   GERD (gastroesophageal reflux disease) 02/14/2018   OSA (obstructive sleep apnea) 07/24/2017   Chronic tension-type headache, not intractable 07/29/2016   GAD (generalized anxiety disorder) 04/30/2007   ALLERGIC RHINITIS 02/27/2007   Allergic asthma 02/27/2007      Prior to Admission medications   Medication Sig Start Date End Date  Taking? Authorizing Provider  albuterol (VENTOLIN HFA) 108 (90 Base) MCG/ACT inhaler Inhale 2 puffs into the lungs every 6 (six) hours as needed for wheezing or shortness of breath. 05/06/21   Viviano Simas, FNP  metoprolol succinate (TOPROL-XL) 25 MG 24 hr tablet Take 1 tablet (25 mg total) by mouth daily for 7 days. 06/01/21 06/08/21  Willy Eddy, MD  omeprazole (PRILOSEC) 20 MG capsule TAKE 1 CAPSULE(20 MG) BY MOUTH DAILY 10/13/20   Karie Schwalbe, MD    Allergies Patient has no known allergies.    Social History Social History   Tobacco Use   Smoking status: Never   Smokeless tobacco: Never  Vaping Use   Vaping Use: Never used  Substance Use Topics   Alcohol use: Yes    Comment: occasional   Drug use: No    Review of Systems Patient denies headaches, rhinorrhea, blurry vision, numbness, shortness of breath, chest pain, edema, cough, abdominal pain, nausea, vomiting, diarrhea, dysuria, fevers, rashes or hallucinations unless otherwise stated above in HPI. ____________________________________________   PHYSICAL EXAM:  VITAL SIGNS: Vitals:   06/01/21 1355 06/01/21 1633  BP: (!) 137/100 (!) 133/92  Pulse: 100 77  Resp: 18 (!) 22  Temp: 97.8 F (36.6 C) 98.2 F (36.8 C)  SpO2: 99% 99%    Constitutional: Alert and oriented.  Eyes: Conjunctivae are normal.  Head: Atraumatic. Nose: No congestion/rhinnorhea. Mouth/Throat: Mucous membranes are moist.   Neck: No stridor. Painless ROM.  Cardiovascular: Normal rate, regular rhythm. Grossly normal heart sounds.  Good peripheral circulation. Respiratory: Normal respiratory effort.  No retractions. Lungs CTAB. Gastrointestinal: Soft and nontender. No distention. No abdominal bruits. No CVA tenderness. Genitourinary:  Musculoskeletal: No lower extremity tenderness nor edema.  No joint effusions. Neurologic:  Normal speech and language. No gross focal neurologic deficits are appreciated. No facial droop Skin:  Skin is  warm, dry and intact. No rash noted. Psychiatric: Mood and affect are normal. Speech and behavior are normal.  ____________________________________________   LABS (all labs ordered are listed, but only abnormal results are displayed)  Results for orders placed or performed during the hospital encounter of 06/01/21 (from the past 24 hour(s))  CBC with Differential     Status: Abnormal   Collection Time: 06/01/21  1:59 PM  Result Value Ref Range   WBC 6.5 4.0 - 10.5 K/uL   RBC 4.08 (L) 4.22 - 5.81 MIL/uL   Hemoglobin 12.9 (L) 13.0 - 17.0 g/dL   HCT 67.5 (L) 91.6 - 38.4 %   MCV 92.6 80.0 - 100.0 fL   MCH 31.6 26.0 - 34.0 pg   MCHC 34.1 30.0 - 36.0 g/dL   RDW 66.5 99.3 - 57.0 %   Platelets 224 150 - 400 K/uL   nRBC 0.0 0.0 - 0.2 %   Neutrophils Relative % 58 %   Neutro Abs 3.7 1.7 - 7.7 K/uL   Lymphocytes Relative 29 %   Lymphs Abs 1.9 0.7 - 4.0 K/uL   Monocytes Relative 11 %   Monocytes Absolute 0.7 0.1 - 1.0 K/uL   Eosinophils Relative 1 %   Eosinophils Absolute 0.0 0.0 - 0.5 K/uL   Basophils Relative 1 %   Basophils Absolute 0.1 0.0 - 0.1 K/uL   Immature Granulocytes 0 %   Abs Immature Granulocytes 0.01 0.00 - 0.07 K/uL  Basic metabolic panel     Status: Abnormal   Collection Time: 06/01/21  1:59 PM  Result Value Ref Range   Sodium 135 135 - 145 mmol/L   Potassium 4.0 3.5 - 5.1 mmol/L   Chloride 101 98 - 111 mmol/L   CO2 26 22 - 32 mmol/L   Glucose, Bld 110 (H) 70 - 99 mg/dL   BUN 11 6 - 20 mg/dL   Creatinine, Ser 1.77 0.61 - 1.24 mg/dL   Calcium 8.8 (L) 8.9 - 10.3 mg/dL   GFR, Estimated >93 >90 mL/min   Anion gap 8 5 - 15  Troponin I (High Sensitivity)     Status: None   Collection Time: 06/01/21  1:59 PM  Result Value Ref Range   Troponin I (High Sensitivity) 2 <18 ng/L  Magnesium     Status: None   Collection Time: 06/01/21  1:59 PM  Result Value Ref Range   Magnesium 1.7 1.7 - 2.4 mg/dL  TSH     Status: None   Collection Time: 06/01/21  1:59 PM  Result  Value Ref Range   TSH 1.419 0.350 - 4.500 uIU/mL  Troponin I (High Sensitivity)     Status: None   Collection Time: 06/01/21  4:44 PM  Result Value Ref Range   Troponin I (High Sensitivity) 3 <18 ng/L   ____________________________________________  EKG My review and personal interpretation at Time:  13:53 Indication: palpitations  Rate: 95  Rhythm: sinus Axis: normal Other: normal intervals, no stemi, no preexcitation ____________________________________________  RADIOLOGY  I personally reviewed all radiographic images ordered to evaluate for the above acute complaints and reviewed radiology reports and findings.  These findings were personally discussed with the patient.  Please see medical record for radiology report.  ____________________________________________   PROCEDURES  Procedure(s) performed:  Procedures    Critical Care performed: no ____________________________________________   INITIAL IMPRESSION / ASSESSMENT AND PLAN / ED COURSE  Pertinent labs & imaging results that were available during my care of the patient were reviewed by me and considered in my medical decision making (see chart for details).   DDX: dysrhythmia, acs, chf, pna, pe, ptx, electrolyte abn, hyporethyroid  BURTIS IMHOFF is a 40 y.o. who presents to the ED with presentation as described above.  Patient clinically very well-appearing in no acute distress.  His cardiac enzymes are negative.  This not consistent with ACS.  His EKG is normal.  He is low risk by Wells criteria is PERC negative on arrival.  He is not having any symptoms that would suggest PE as primary concern is palpitations most likely secondary to patient missing doses of his metoprolol.  He does have EKG changes on the EKG performed at urgent care but on close inspection I think this is more likely lead reversal as is the same morphology as other leads but transposed.  In the setting of negative enzymes with no chest pain or  pressure is most likely result of this.  His chest x-ray is reassuring.  Patient states he will go pick up his metoprolol and will follow up with cardiology.  Patient stable and appropriate for outpatient follow-up.  We discussed signs and symptoms for which she should return to the ER.     The patient was evaluated in Emergency Department today for the symptoms described in the history of present illness. He/she was evaluated in the context of the global COVID-19 pandemic, which necessitated consideration that the patient might be at risk for infection with the SARS-CoV-2 virus that causes COVID-19. Institutional protocols and algorithms that pertain to the evaluation of patients at risk for COVID-19 are in a state of rapid change based on information released by regulatory bodies including the CDC and federal and state organizations. These policies and algorithms were followed during the patient's care in the ED.  As part of my medical decision making, I reviewed the following data within the electronic MEDICAL RECORD NUMBER Nursing notes reviewed and incorporated, Labs reviewed, notes from prior ED visits and [place] Controlled Substance Database   ____________________________________________   FINAL CLINICAL IMPRESSION(S) / ED DIAGNOSES  Final diagnoses:  Palpitations      NEW MEDICATIONS STARTED DURING THIS VISIT:  Current Discharge Medication List       Note:  This document was prepared using Dragon voice recognition software and may include unintentional dictation errors.    Willy Eddy, MD 06/01/21 [phone]

## 2021-06-03 DIAGNOSIS — R000 Tachycardia, unspecified: Secondary | ICD-10-CM | POA: Diagnosis not present

## 2021-06-03 DIAGNOSIS — R0789 Other chest pain: Secondary | ICD-10-CM | POA: Diagnosis not present

## 2021-06-03 DIAGNOSIS — R002 Palpitations: Secondary | ICD-10-CM | POA: Diagnosis not present

## 2021-06-03 DIAGNOSIS — R001 Bradycardia, unspecified: Secondary | ICD-10-CM | POA: Diagnosis not present

## 2021-06-09 ENCOUNTER — Other Ambulatory Visit: Payer: Self-pay

## 2021-06-09 ENCOUNTER — Ambulatory Visit: Payer: BC Managed Care – PPO

## 2021-06-09 DIAGNOSIS — Z23 Encounter for immunization: Secondary | ICD-10-CM

## 2021-08-26 ENCOUNTER — Ambulatory Visit: Payer: BC Managed Care – PPO | Admitting: Internal Medicine

## 2021-09-01 ENCOUNTER — Ambulatory Visit: Payer: BC Managed Care – PPO | Admitting: Internal Medicine

## 2021-09-06 ENCOUNTER — Emergency Department
Admission: EM | Admit: 2021-09-06 | Discharge: 2021-09-06 | Disposition: A | Discharge status: Home / Self Care | Payer: BC Managed Care – PPO | Attending: Emergency Medicine | Admitting: Emergency Medicine

## 2021-09-06 ENCOUNTER — Emergency Department: Payer: BC Managed Care – PPO

## 2021-09-06 ENCOUNTER — Other Ambulatory Visit: Payer: Self-pay

## 2021-09-06 ENCOUNTER — Encounter: Payer: Self-pay | Admitting: Emergency Medicine

## 2021-09-06 DIAGNOSIS — Z20822 Contact with and (suspected) exposure to covid-19: Secondary | ICD-10-CM | POA: Diagnosis not present

## 2021-09-06 DIAGNOSIS — J45909 Unspecified asthma, uncomplicated: Secondary | ICD-10-CM | POA: Diagnosis not present

## 2021-09-06 DIAGNOSIS — E871 Hypo-osmolality and hyponatremia: Secondary | ICD-10-CM | POA: Diagnosis not present

## 2021-09-06 DIAGNOSIS — R079 Chest pain, unspecified: Secondary | ICD-10-CM | POA: Diagnosis not present

## 2021-09-06 LAB — BASIC METABOLIC PANEL
Anion gap: 6 (ref 5–15)
BUN: 10 mg/dL (ref 6–20)
CO2: 27 mmol/L (ref 22–32)
Calcium: 8.8 mg/dL — ABNORMAL LOW (ref 8.9–10.3)
Chloride: 99 mmol/L (ref 98–111)
Creatinine, Ser: 1.08 mg/dL (ref 0.61–1.24)
GFR, Estimated: >60 mL/min (ref >60)
Glucose, Bld: 112 mg/dL — ABNORMAL HIGH (ref 70–99)
Potassium: 4.2 mmol/L (ref 3.5–5.1)
Sodium: 132 mmol/L — ABNORMAL LOW (ref 135–145)

## 2021-09-06 LAB — CBC
HCT: 41.3 % (ref 39.0–52.0)
Hemoglobin: 13.8 g/dL (ref 13.0–17.0)
MCH: 30.3 pg (ref 26.0–34.0)
MCHC: 33.4 g/dL (ref 30.0–36.0)
MCV: 90.6 fL (ref 80.0–100.0)
Platelets: 290 10*3/uL (ref 150–400)
RBC: 4.56 MIL/uL (ref 4.22–5.81)
RDW: 13.7 % (ref 11.5–15.5)
WBC: 6.9 10*3/uL (ref 4.0–10.5)
nRBC: 0 % (ref 0.0–0.2)

## 2021-09-06 LAB — RESP PANEL BY RT-PCR (FLU A&B, COVID) ARPGX2
Influenza A by PCR: NEGATIVE
Influenza B by PCR: NEGATIVE
SARS Coronavirus 2 by RT PCR: NEGATIVE

## 2021-09-06 LAB — TROPONIN I (HIGH SENSITIVITY)
Troponin I (High Sensitivity): <2 ng/L (ref <18)
Troponin I (High Sensitivity): <2 ng/L (ref <18)

## 2021-09-06 NOTE — ED Provider Notes (Signed)
[facility] Provider Note    Event Date/Time   First MD Initiated Contact with Patient 09/06/21 [phone]     (approximate)   History   Chest Pain   HPI  William Stuart is a 41 y.o. male with anxiety, asthma, GERD who comes in with chest pain.  Patient reports a little bit of chest symptoms that happened yesterday and so that happened today just prior to arrival.  He reports it like a sharp stabbing pain into his chest with a little sweatiness.  He denies any shortness of breath to me.  Denies any risk factors for PE.  Denies any unilateral leg swelling.  He states that he is not have any pain at this time he wanted, make sure that he was not having a heart attack.  Patient does report drinking daily but denies any pain in his abdomen.  Physical Exam   Triage Vital Signs: ED Triage Vitals  Enc Vitals Group     BP 09/06/21 0926 (!) 147/97     Pulse Rate 09/06/21 0926 96     Resp 09/06/21 0926 18     Temp --      Temp src --      SpO2 09/06/21 0926 97 %     Weight 09/06/21 0924 300 lb (136.1 kg)     Height 09/06/21 0924 6\' 2"  (1.88 m)     Head Circumference --      Peak Flow --      Pain Score 09/06/21 0924 2     Pain Loc --      Pain Edu? --      Excl. in GC? --     Most recent vital signs: Vitals:   09/06/21 0926 09/06/21 0930  BP: (!) 147/97 138/87  Pulse: 96 93  Resp: 18 16  SpO2: 97% 99%     General: Awake, no distress.  CV:  Good peripheral perfusion.  No chest wall pain Resp:  Normal effort.  Clear lung Abd:  No distention.  Nondistended Other:  No leg pain, no calf tenderness   ED Results / Procedures / Treatments   Labs (all labs ordered are listed, but only abnormal results are displayed) Labs Reviewed  BASIC METABOLIC PANEL - Abnormal; Notable for the following components:      Result Value   Sodium 132 (*)    Glucose, Bld 112 (*)    Calcium 8.8 (*)    All other components within normal limits  RESP PANEL BY RT-PCR (FLU  A&B, COVID) ARPGX2  CBC  TROPONIN I (HIGH SENSITIVITY)  TROPONIN I (HIGH SENSITIVITY)     EKG  My interpretation of EKG:  Normal sinus rate of 89 without any ST elevation, does have T wave version in lead III, normal intervals.  Reviewed prior EKGs he has had similar T wave version  RADIOLOGY I have reviewed the xray personally and agree with radiology read negative chest x-ray   PROCEDURES:  Critical Care performed: No  .1-3 Lead EKG Interpretation Performed by: 09/08/21, MD Authorized by: Concha Se, MD     Interpretation: normal     ECG rate:  70   ECG rate assessment: normal     Rhythm: sinus rhythm     Ectopy: none     Conduction: normal     MEDICATIONS ORDERED IN ED: Medications - No data to display   IMPRESSION / MDM / ASSESSMENT AND PLAN / ED COURSE  I reviewed the triage vital signs and the nursing notes.   Differential diagnosis includes, but is not limited to, ACS, atypical chest pain.  Abdomen is soft and nontender and the pain is not in his abdomen to suggest pancreatitis, gallbladder pathology.  Cardiac markers are negative x2 COVID, flu are negative BMP shows slightly low sodium but kidney function is normal CBC no anemia  Chest x-ray without any pneumothorax, pneumonia I considered admitting patient to the hospital given he came in with chest pain but given his EKG and cardiac markers are negative and patient is chest pain-free I think it be reasonable for him to go home and follow-up outpatient with cardiolog  Reevaluated patient remains chest pain-free.  Discussed with him that even though his work-up was reassuring just to follow-up with cardiology given some risk factors.  He expressed understanding felt comfortable with this plan   The patient is on the cardiac monitor to evaluate for evidence of arrhythmia and/or significant heart rate changes.   FINAL CLINICAL IMPRESSION(S) / ED DIAGNOSES   Final diagnoses:  Chest pain,  unspecified type     Rx / DC Orders   ED Discharge Orders     None        Note:  This document was prepared using Dragon voice recognition software and may include unintentional dictation errors.   Concha Se, MD 09/06/21 1350

## 2021-09-06 NOTE — ED Notes (Signed)
Back from xray, no changes, alert, NAD, calm, interactive.  

## 2021-09-06 NOTE — ED Notes (Signed)
C/o chest heaviness, mild sweatiness, and difficult getting a deep breath, but not sob. Denies NVD, fever, cough, congestion, dizziness. Pt to xray by stretcher.

## 2021-09-06 NOTE — Discharge Instructions (Addendum)
Please return to the ER if your chest pain is changing or develop pain with exertion or is different from what you had today but so far there is no signs of a heart attack and you can call cardiology to follow-up

## 2021-09-06 NOTE — ED Triage Notes (Signed)
Pt via POV from home. Pt c/o centralized chest pressure that happened yesterday and started today too. Pt states he because diaphoretic. Denies NVD. Pt state mild SOB. Pt is A&Ox4 and NAD  Denies significant cardiac hx.

## 2021-09-09 ENCOUNTER — Telehealth: Payer: Self-pay

## 2021-09-09 NOTE — Telephone Encounter (Signed)
Left message on VM to call office or respond in MyChart to see how he was doing. I did not provide any other info as he does not have a DPR.

## 2021-09-20 ENCOUNTER — Other Ambulatory Visit: Payer: Self-pay | Admitting: Internal Medicine

## 2021-10-18 ENCOUNTER — Other Ambulatory Visit: Payer: Self-pay | Admitting: Student

## 2021-10-18 DIAGNOSIS — R000 Tachycardia, unspecified: Secondary | ICD-10-CM | POA: Diagnosis not present

## 2021-10-18 DIAGNOSIS — Z136 Encounter for screening for cardiovascular disorders: Secondary | ICD-10-CM

## 2021-10-18 DIAGNOSIS — R079 Chest pain, unspecified: Secondary | ICD-10-CM | POA: Diagnosis not present

## 2021-10-18 DIAGNOSIS — F411 Generalized anxiety disorder: Secondary | ICD-10-CM | POA: Diagnosis not present

## 2021-10-18 DIAGNOSIS — R002 Palpitations: Secondary | ICD-10-CM | POA: Diagnosis not present

## 2021-10-20 DIAGNOSIS — R000 Tachycardia, unspecified: Secondary | ICD-10-CM | POA: Diagnosis not present

## 2021-10-22 ENCOUNTER — Ambulatory Visit
Admission: RE | Admit: 2021-10-22 | Discharge: 2021-10-22 | Disposition: A | Discharge status: Home / Self Care | Payer: BC Managed Care – PPO | Source: Ambulatory Visit | Attending: Student | Admitting: Student

## 2021-10-22 ENCOUNTER — Other Ambulatory Visit: Payer: Self-pay

## 2021-10-22 DIAGNOSIS — Z136 Encounter for screening for cardiovascular disorders: Secondary | ICD-10-CM

## 2021-11-02 ENCOUNTER — Other Ambulatory Visit: Payer: BC Managed Care – PPO

## 2021-11-04 ENCOUNTER — Ambulatory Visit (INDEPENDENT_AMBULATORY_CARE_PROVIDER_SITE_OTHER): Payer: BC Managed Care – PPO | Admitting: Internal Medicine

## 2021-11-04 ENCOUNTER — Encounter: Payer: Self-pay | Admitting: Internal Medicine

## 2021-11-04 DIAGNOSIS — K2100 Gastro-esophageal reflux disease with esophagitis, without bleeding: Secondary | ICD-10-CM

## 2021-11-04 DIAGNOSIS — Z0000 Encounter for general adult medical examination without abnormal findings: Secondary | ICD-10-CM | POA: Diagnosis not present

## 2021-11-04 DIAGNOSIS — R002 Palpitations: Secondary | ICD-10-CM

## 2021-11-04 NOTE — Assessment & Plan Note (Signed)
Healthy but needs to focus on lifestyle ?Low carb, no sugared drinks, rare eating out, back to the gym ?FLu vaccine in the fall and COVID if recommended ?Too young for cancer screening ?

## 2021-11-04 NOTE — Assessment & Plan Note (Signed)
Ongoing symptoms ?No CAD ?30 day monitor now ?On metoprolol 25mg  ? ?

## 2021-11-04 NOTE — Assessment & Plan Note (Signed)
Needs to take the omeprazole 20mg  daily ?

## 2021-11-04 NOTE — Progress Notes (Signed)
? ?Subjective:  ? ? Patient ID: William Stuart, male    DOB: Jan 31, 1981, 41 y.o.   MRN: [id number] ? ?HPI ?Here for physical ? ?Back in ER 1/30----racing heart, etc ?Benign work up  ?Wearing 30 day monitor now---very brief "fits" while on ?Had CT cardiac scan--negative chest CT and calcium score of 0 ?Thinks he forgot the metoprolol the day he wound up in the ER ? ?Having some back problems---goes back years but now more frequent ?Stabbing upper back pains --can radiate up to jaw even ?May awaken him---better if he stands ? ?Signed back up at gym a few months ago---didn't like how he felt ?Now trying to eat out less ?Has cut back on sugar  ? ?Current Outpatient Medications on File Prior to Visit  ?Medication Sig Dispense Refill  ? omeprazole (PRILOSEC) 20 MG capsule TAKE 1 CAPSULE(20 MG) BY MOUTH DAILY 90 capsule 3  ? albuterol (VENTOLIN HFA) 108 (90 Base) MCG/ACT inhaler Inhale 2 puffs into the lungs every 6 (six) hours as needed for wheezing or shortness of breath. (Patient not taking: Reported on 11/04/2021) 8 g 0  ? metoprolol succinate (TOPROL-XL) 25 MG 24 hr tablet Take 1 tablet (25 mg total) by mouth daily for 7 days. 7 tablet 0  ? ?No current facility-administered medications on file prior to visit.  ? ? ?No Known Allergies ? ?Past Medical History:  ?Diagnosis Date  ? Allergy   ? Anxiety   ? Asthma   ? GERD (gastroesophageal reflux disease)   ? ? ?Past Surgical History:  ?Procedure Laterality Date  ? ORCHIECTOMY    ? L orchiectomy and R orchiopexy for torsion   2002  ? PILONIDAL CYST EXCISION  2000 & 2001  ? 3 times  ? ? ?Family History  ?Problem Relation Age of Onset  ? Migraines Mother   ? Celiac disease Mother   ? Crohn's disease Mother   ? Migraines Father   ? Celiac disease Father   ? Cancer Neg Hx   ? Colon cancer Neg Hx   ? Esophageal cancer Neg Hx   ? Pancreatic cancer Neg Hx   ? Rectal cancer Neg Hx   ? Stomach cancer Neg Hx   ? ? ?Social History  ? ?Socioeconomic History  ? Marital status: Married   ?  Spouse name: Not on file  ? Number of children: 2  ? Years of education: Not on file  ? Highest education level: Not on file  ?Occupational History  ? 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  Employer: [employer]  ?Tobacco Use  ? Smoking status: Never  ? Smokeless tobacco: Never  ?Vaping Use  ? Vaping Use: Never used  ?Substance and Sexual Activity  ? Alcohol use: Yes  ?  Comment: occasional  ? Drug use: No  ? Sexual activity: Not on file  ?Other Topics Concern  ? Not on file  ?Social History Narrative  ? Not on file  ? ?Social Determinants of Health  ? ?Financial Resource Strain: Not on file  ?Food Insecurity: Not on file  ?Transportation Needs: Not on file  ?Physical Activity: Not on file  ?Stress: Not on file  ?Social Connections: Not on file  ?Intimate Partner Violence: Not on file  ? ?Review of Systems  ?Constitutional:  Negative for fatigue.  ?     Wears seat belt  ?HENT:  Negative for dental problem, hearing loss, tinnitus and trouble swallowing.   ?     Keeps  up with dentist  ?Eyes:  Negative for visual disturbance.  ?     No diplopia or unilateral vision loss  ?Respiratory:  Positive for chest tightness. Negative for cough and shortness of breath.   ?Cardiovascular:  Positive for palpitations. Negative for chest pain.  ?     Slight leg swelling at the end of the day  ?Gastrointestinal:  Negative for blood in stool and constipation.  ?     Heartburn mostly controlled with omeprazole. Rare water brash  ?Endocrine: Negative for polydipsia and polyuria.  ?Genitourinary:  Negative for difficulty urinating and urgency.  ?     No sexual problems  ?Musculoskeletal:  Positive for back pain. Negative for arthralgias and joint swelling.  ?Skin:  Negative for rash.  ?Allergic/Immunologic: Positive for environmental allergies. Negative for immunocompromised state.  ?     Generally doesn't take meds  ?Neurological:  Positive for dizziness. Negative for syncope and headaches.   ?Hematological:  Negative for adenopathy. Does not bruise/bleed easily.  ?Psychiatric/Behavioral:  Negative for dysphoric mood.   ?     Ongoing sleep issues---initiates fine, good for 4-5 hours, then up (pain,nocturia) ?Persistent anxiety---no longer on medication (prozac may have helped but he never refilled it) ?Stress with MFA program he is doing now (for terminal degree)  ? ?   ?Objective:  ? Physical Exam ?Constitutional:   ?   Appearance: Normal appearance.  ?HENT:  ?   Mouth/Throat:  ?   Pharynx: No oropharyngeal exudate or posterior oropharyngeal erythema.  ?Eyes:  ?   Conjunctiva/sclera: Conjunctivae normal.  ?   Pupils: Pupils are equal, round, and reactive to light.  ?Cardiovascular:  ?   Rate and Rhythm: Normal rate and regular rhythm.  ?   Pulses: Normal pulses.  ?   Heart sounds: No murmur heard. ?  No gallop.  ?Pulmonary:  ?   Effort: Pulmonary effort is normal.  ?   Breath sounds: Normal breath sounds. No wheezing or rales.  ?Abdominal:  ?   Palpations: Abdomen is soft.  ?   Tenderness: There is no abdominal tenderness.  ?Musculoskeletal:  ?   Cervical back: Neck supple.  ?   Right lower leg: No edema.  ?   Left lower leg: No edema.  ?Lymphadenopathy:  ?   Cervical: No cervical adenopathy.  ?Skin: ?   Findings: No lesion or rash.  ?Neurological:  ?   General: No focal deficit present.  ?   Mental Status: He is alert and oriented to person, place, and time.  ?Psychiatric:     ?   Mood and Affect: Mood normal.     ?   Behavior: Behavior normal.  ?  ? ? ? ? ?   ?Assessment & Plan:  ? ?

## 2021-11-30 DIAGNOSIS — R42 Dizziness and giddiness: Secondary | ICD-10-CM | POA: Diagnosis not present

## 2021-11-30 DIAGNOSIS — F411 Generalized anxiety disorder: Secondary | ICD-10-CM | POA: Diagnosis not present

## 2021-11-30 DIAGNOSIS — I498 Other specified cardiac arrhythmias: Secondary | ICD-10-CM | POA: Diagnosis not present

## 2021-11-30 DIAGNOSIS — R079 Chest pain, unspecified: Secondary | ICD-10-CM | POA: Diagnosis not present

## 2021-11-30 DIAGNOSIS — R000 Tachycardia, unspecified: Secondary | ICD-10-CM | POA: Diagnosis not present

## 2021-12-13 ENCOUNTER — Encounter: Payer: Self-pay | Admitting: Nurse Practitioner

## 2021-12-13 ENCOUNTER — Ambulatory Visit: Payer: BC Managed Care – PPO | Admitting: Nurse Practitioner

## 2021-12-13 VITALS — BP 118/84 | HR 69 | Temp 97.3°F | Resp 18

## 2021-12-13 DIAGNOSIS — R42 Dizziness and giddiness: Secondary | ICD-10-CM

## 2021-12-13 DIAGNOSIS — F419 Anxiety disorder, unspecified: Secondary | ICD-10-CM

## 2021-12-13 LAB — GLUCOSE, POCT (MANUAL RESULT ENTRY): POC Glucose: 101 mg/dl — AB (ref 70–99)

## 2021-12-13 NOTE — Progress Notes (Signed)
? ?Subjective:  ? ? Patient ID: William Stuart, male    DOB: [birth year], 41 y.o.   MRN: [id number] ? ?HPI ? ?41 year old male presenting to [facility] with complaint of lightheaded feeling this morning.  ?He first noted that he felt off on April 27th. That day he experienced a lightheaded feeling associated with visual changes he describes as if everything got "brighter" He was traveling to [place] with his family over the weekend and did not feel "right" sometimes when moving head quickly he will feel a lag or a shift in vision. Does have lightheaded episodes when standing quickly at times. Is able to monitor Heart Rate on Apple Watch and has noted rates in the 50s  ? ?He has a history of palpitations  ?Was most recently in the ER in January of this year with chest pain. He had a negative cardiac workup at that time, and did follow up with [facility] after ER visit. Had CT cardiac/EKG and troponin at that time. All were WNL. Has also worn halter monitor in the past- ? ?He has been taking Metoprolol for the past 2-3 years, he cannot remember exactly when it was started, but primary reason is for rate control secondary to palpitations.  ? ?He is currently on Prilosec for GERD symptoms- well controlled  ? ?He denies a history of DM ? ?He sees Dr. Alphonsus Sias for PCP. He has been on anxiety medications in the past, has had SE and has felt he has had some improvement possibly. Was offered to restart at most recent physical with PCP but has not.  ? ?Has a history of tension HA. He has seen Neurology in the past  ?Asthma as a child no recent exacerbations, does have Albuterol for as needed ? ?Denies a history of known COVID ? ?Denies any recent stressors.  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  ? ?Today's Vitals  ? 12/13/21 1146  ?BP: 118/84  ?Pulse: 69  ?Resp: 18  ?Temp: (!) 97.3 ?F (36.3 ?C)  ?TempSrc: Tympanic  ?SpO2: 98%  ? ?There is no height or weight  on file to calculate BMI.  ? ? ? ?Past Medical History:  ?Diagnosis Date  ? Allergy   ? Anxiety   ? Asthma   ? GERD (gastroesophageal reflux disease)   ?  ? ?Current Outpatient Medications  ?Medication Instructions  ? albuterol (VENTOLIN HFA) 108 (90 Base) MCG/ACT inhaler 2 puffs, Inhalation, Every 6 hours PRN  ? metoprolol succinate (TOPROL-XL) 25 mg, Oral, Daily  ? omeprazole (PRILOSEC) 20 MG capsule TAKE 1 CAPSULE(20 MG) BY MOUTH DAILY  ?  ? ?Review of Systems  ?Constitutional: Negative.   ?HENT: Negative.    ?Eyes:  Positive for visual disturbance.  ?Respiratory: Negative.    ?Cardiovascular:  Positive for palpitations.  ?Gastrointestinal: Negative.   ?Musculoskeletal: Negative.   ?Neurological:  Positive for light-headedness. Negative for syncope and weakness.  ?Psychiatric/Behavioral:  The patient is nervous/anxious.   ? ?   ?Objective:  ? Physical Exam ?HENT:  ?   Head: Normocephalic.  ?   Nose: Nose normal.  ?Eyes:  ?   Extraocular Movements: Extraocular movements intact.  ?   Pupils: Pupils are equal, round, and reactive to light.  ?Cardiovascular:  ?   Rate and Rhythm: Normal rate and regular rhythm.  ?   Pulses: Normal pulses.  ?   Heart sounds: Normal heart sounds.  ?Pulmonary:  ?  Effort: Pulmonary effort is normal.  ?Musculoskeletal:  ?   Cervical back: Normal range of motion.  ?Skin: ?   General: Skin is warm.  ?Neurological:  ?   Mental Status: He is alert and oriented to person, place, and time.  ?   GCS: GCS eye subscore is 4. GCS verbal subscore is 5. GCS motor subscore is 6.  ?   Cranial Nerves: No facial asymmetry.  ?   Sensory: Sensation is intact.  ?   Motor: Motor function is intact.  ?   Coordination: Romberg sign negative. Coordination normal. Finger-Nose-Finger Test normal.  ?   Gait: Gait is intact. Tandem walk normal.  ?Psychiatric:     ?   Attention and Perception: Attention normal.     ?   Mood and Affect: Mood is anxious.     ?   Speech: Speech normal.     ?   Behavior: Behavior  normal.     ?   Thought Content: Thought content normal.     ?   Cognition and Memory: Cognition normal.     ?   Judgment: Judgment normal.  ? ? ? ? ?Recent Results (from the past 2160 hour(s))  ?POCT Glucose (CBG)     Status: Abnormal  ? Collection Time: 12/13/21  1:16 PM  ?Result Value Ref Range  ? POC Glucose 101 (A) 70 - 99 mg/dl  ?  Comment: non fasting   ?  ?   ?Assessment & Plan:  ?1. Lightheaded ?- POCT Glucose (CBG) ?2. Acute anxiety ?Discussed physiological responses to anxiety including GI distress, headaches, and palpitations. Encouraged follow up with psychiatry to discuss medication management of anxiety with panic episodes.  ? ?Also gave patient information on EAP counseling - patient to call  ?He may also choose to follow up with PCP to discuss anxiety management.  ?- Ambulatory referral to Psychiatry ? ?Ddx lightheaded episodes may be secondary to bradycardia from Metoprolol - encouraged to watch heart rate during episodes on smart watch and keep log as needed to follow up with PCP  ? ?   ? ?

## 2021-12-28 IMAGING — CR DG CHEST 2V
1 series · 2 of 2 positions shown · non-contrast
Comparison: 02/14/2018

CLINICAL DATA: abnormal EKG with elevated heart rate

EXAM:
CHEST - 2 VIEW

[Series 1: dg chest 2 view · 0.14mm/px · 2 of 2 slices shown]
[im 1/2]
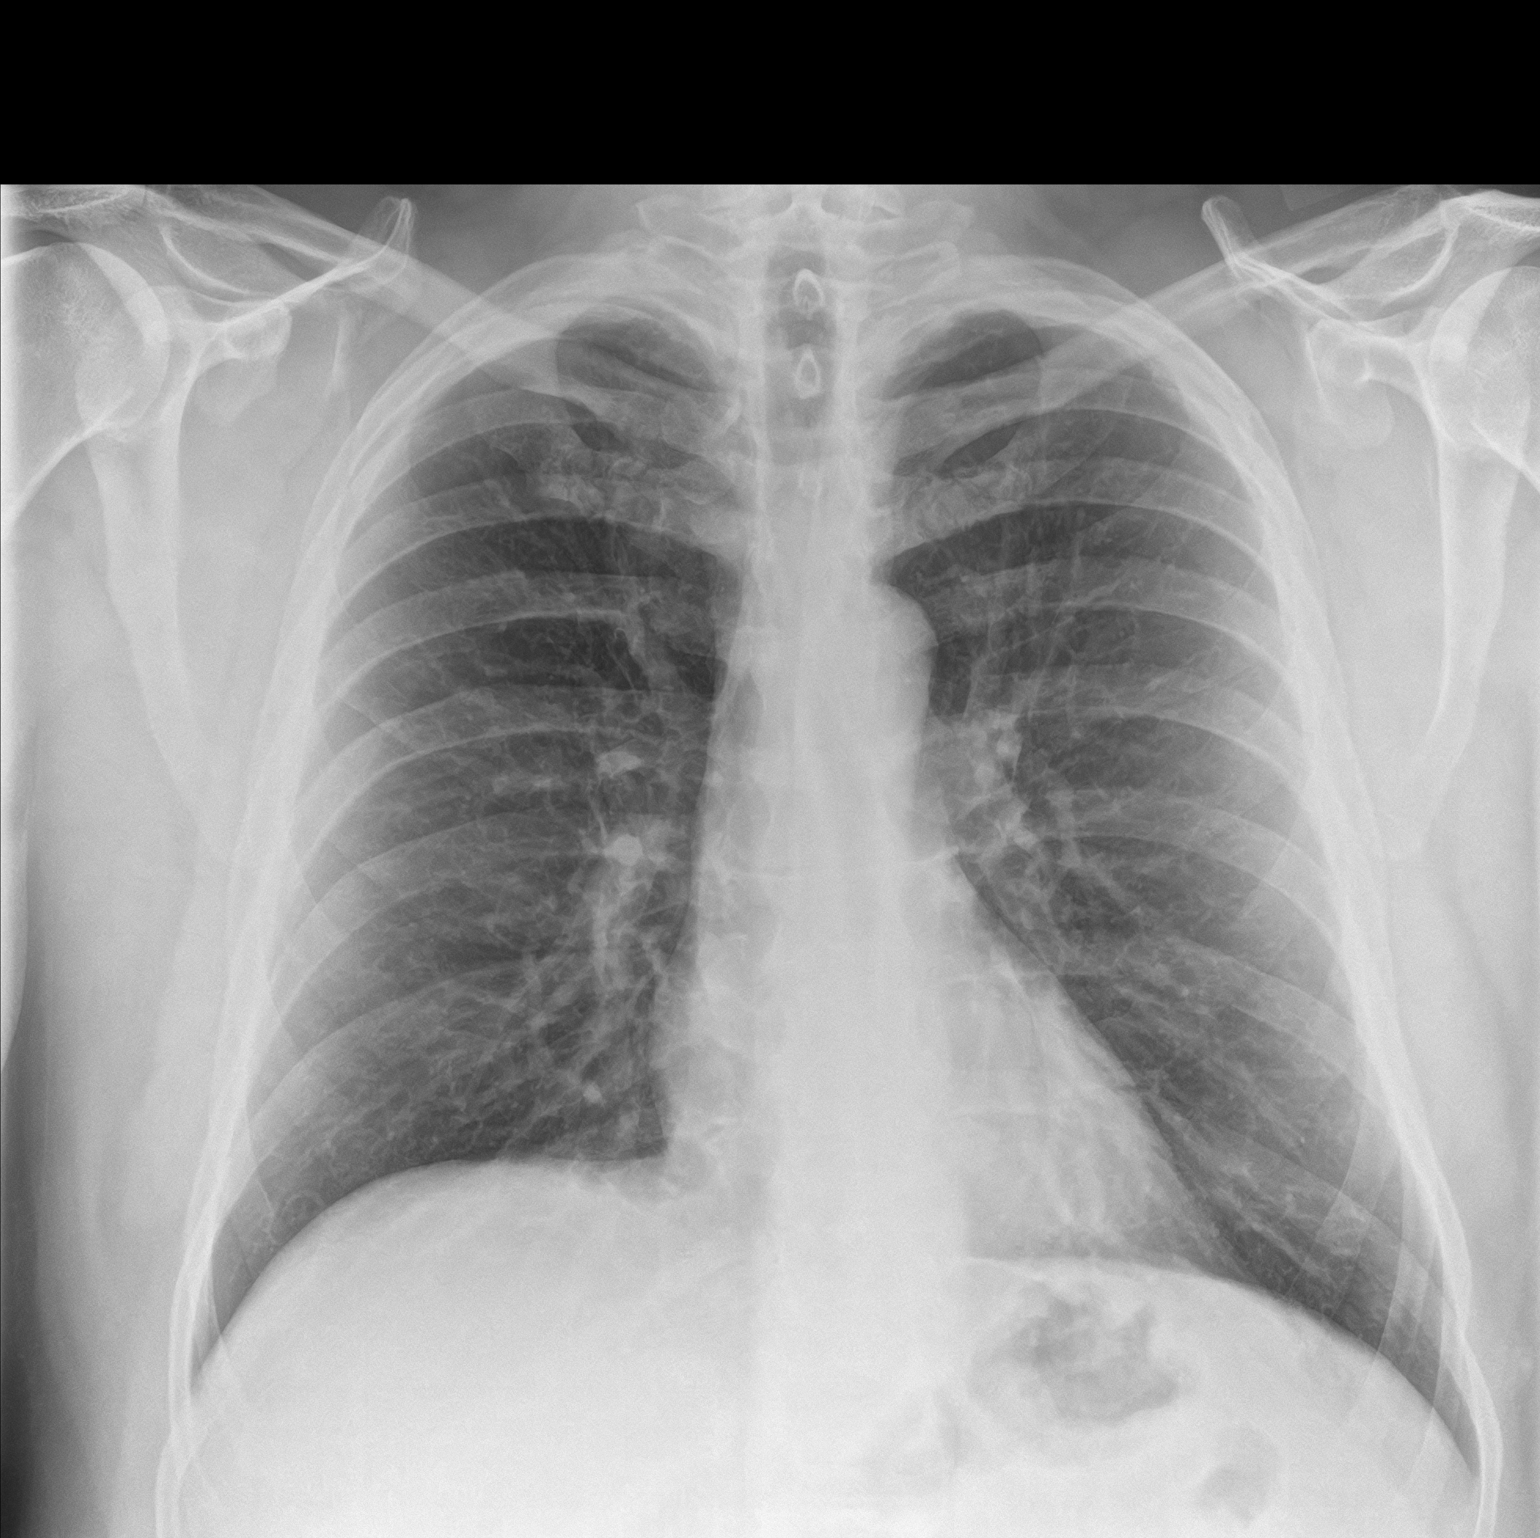
[im 2/2]
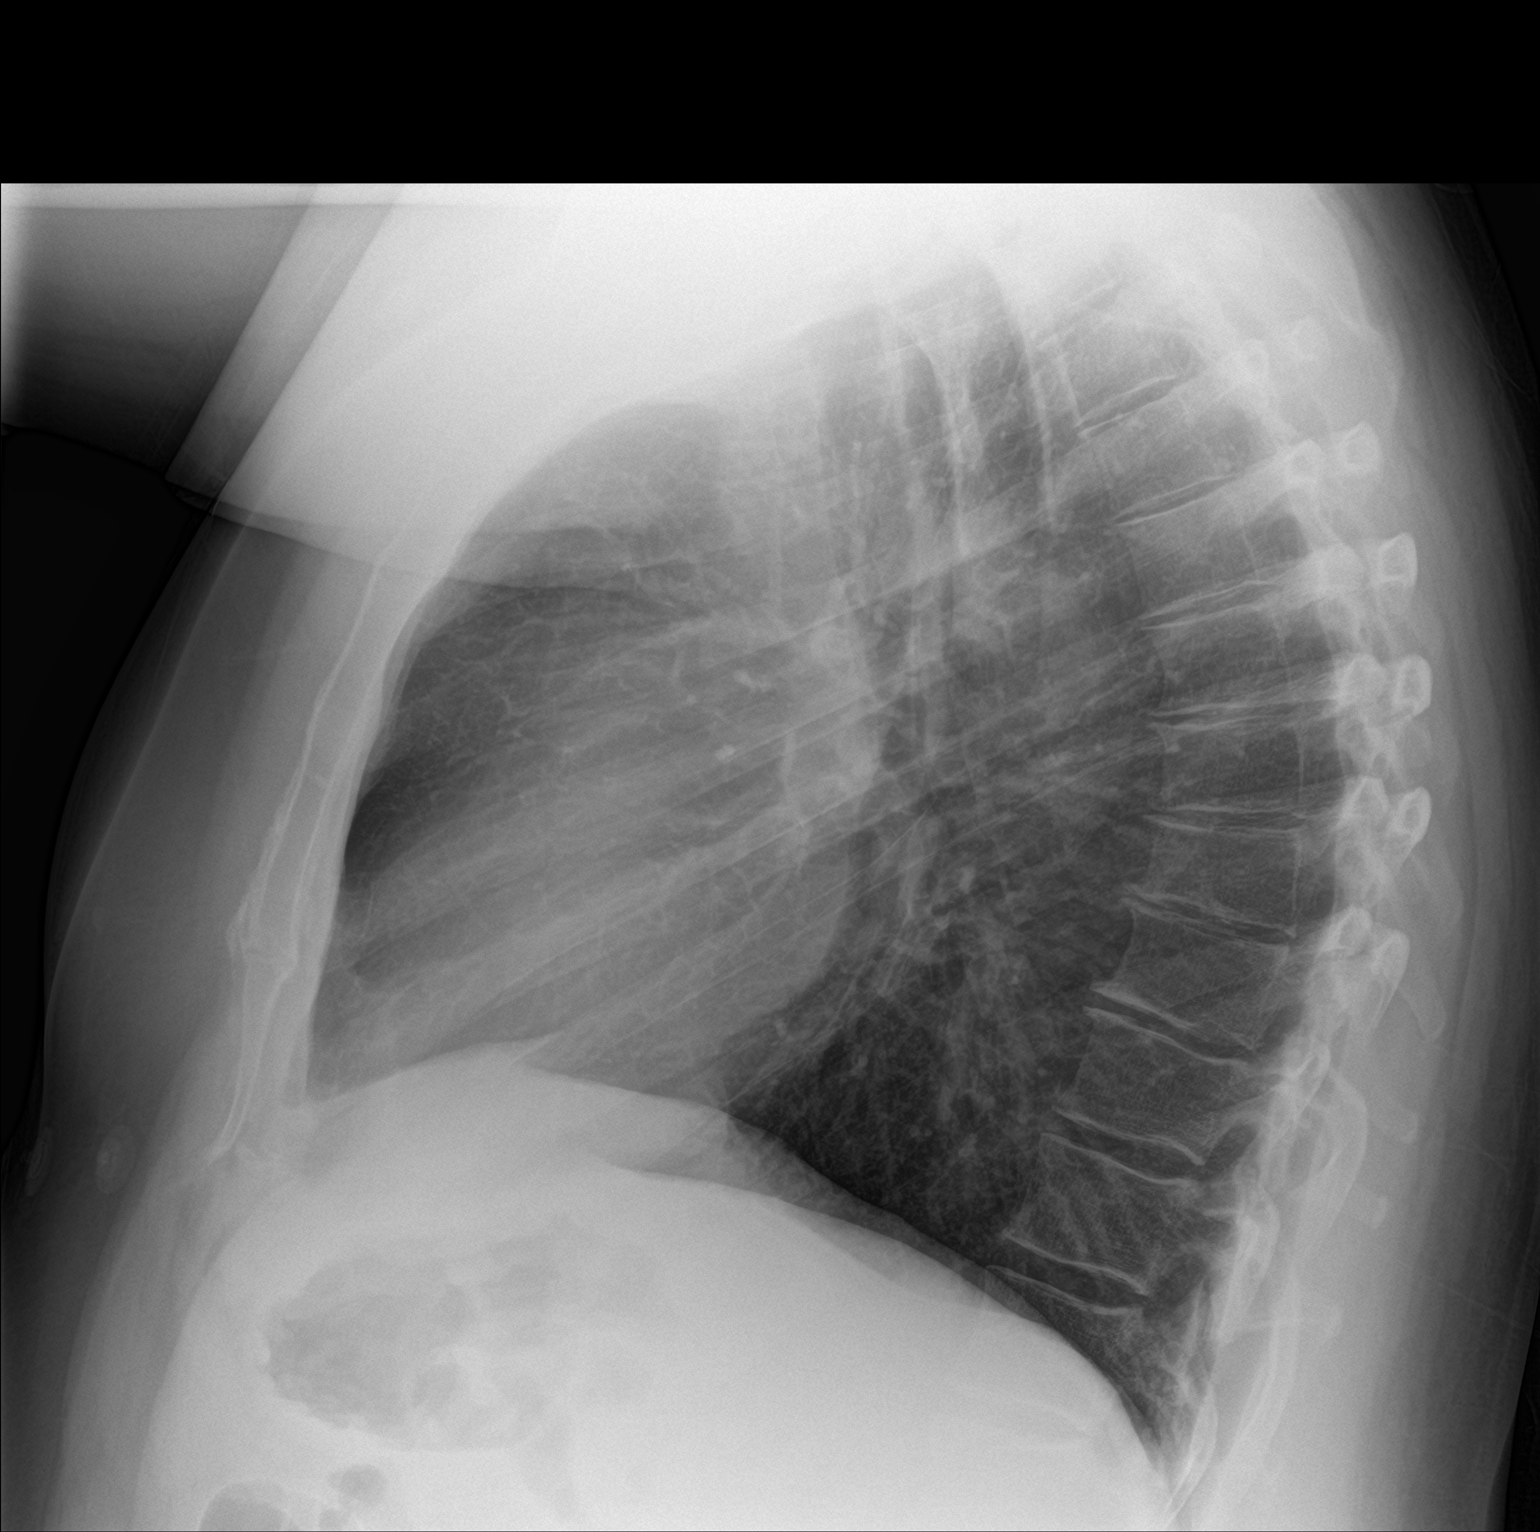

[2 of 2 positions shown; findings below may reference images not displayed]

FINDINGS: The heart size and mediastinal contours are within normal limits.
Both lungs are clear. The visualized skeletal structures are
unremarkable.
IMPRESSION: No active cardiopulmonary disease.

## 2022-03-04 ENCOUNTER — Encounter: Payer: Self-pay | Admitting: Adult Health

## 2022-03-04 ENCOUNTER — Ambulatory Visit: Payer: BC Managed Care – PPO | Admitting: Adult Health

## 2022-03-04 VITALS — BP 128/86 | HR 93 | Temp 98.1°F

## 2022-03-04 DIAGNOSIS — J3489 Other specified disorders of nose and nasal sinuses: Secondary | ICD-10-CM

## 2022-03-04 NOTE — Progress Notes (Signed)
[facility] [address] [place], [place] [address]   Office Visit Note  Patient Name: William Stuart Date of Birth [id number]  Medical Record number [id number]  Date of Service: 03/04/2022  Chief Complaint  Patient presents with   Sinusitis    Pt stated he is having sinus pressure, dizzy,headache and breathing issues. Pt stated while sleeping his Apple watch recorded a heart rate of 44.      Sinusitis Associated symptoms include headaches and sinus pressure.   Pt is here for reporting a few concerns.  He reports some sinus pressure, intermittent light-headedness.  HE does report his apple watch recorded a heart rate of 44 bpm overnight one night.  He is usually in the 50's-60's. He has a history of OSA, but was unable to tolerate CPAP.      Current Medication:  Outpatient Encounter Medications as of 03/04/2022  Medication Sig   omeprazole (PRILOSEC) 20 MG capsule TAKE 1 CAPSULE(20 MG) BY MOUTH DAILY   albuterol (VENTOLIN HFA) 108 (90 Base) MCG/ACT inhaler Inhale 2 puffs into the lungs every 6 (six) hours as needed for wheezing or shortness of breath. (Patient not taking: Reported on 11/04/2021)   metoprolol succinate (TOPROL-XL) 25 MG 24 hr tablet Take 1 tablet (25 mg total) by mouth daily for 7 days.   No facility-administered encounter medications on file as of 03/04/2022.      Medical History: Past Medical History:  Diagnosis Date   Allergy    Anxiety    Asthma    GERD (gastroesophageal reflux disease)      Vital Signs: BP 128/86   Pulse 93   Temp 98.1 F (36.7 C) (Tympanic)   SpO2 98%    Review of Systems  Constitutional:  Negative for fatigue and fever.  HENT:  Positive for sinus pressure.   Cardiovascular:        ?bradycardia?  Neurological:  Positive for headaches.    Physical Exam Constitutional:      Appearance: Normal appearance.  HENT:     Right Ear: Tympanic membrane and ear canal normal.     Left Ear: Tympanic membrane and ear  canal normal.     Nose: Rhinorrhea present. No congestion.     Comments: Pale nares, clear drainage    Mouth/Throat:     Mouth: Mucous membranes are moist.     Pharynx: Oropharynx is clear.  Eyes:     Pupils: Pupils are equal, round, and reactive to light.  Cardiovascular:     Rate and Rhythm: Normal rate.     Pulses: Normal pulses.     Heart sounds: No murmur heard. Pulmonary:     Effort: Pulmonary effort is normal. No respiratory distress.     Breath sounds: No wheezing or rales.  Neurological:     Mental Status: He is alert.    Assessment/Plan: 1. Sinus pressure Discussed using Daily allergy medication such as Zyrtec or Claritin. Also instructed patient to use Flonase, Two sprays in each nostril twice daily.  If symptoms do not improve, or more symptoms such as palpitations, dizziness, syncope, etc occur. Seek treatment immediately. Follow up with cardiology and PCp as planned/discussed      General Counseling: Donis verbalizes understanding of the findings of todays visit and agrees with plan of treatment. I have discussed any further diagnostic evaluation that may be needed or ordered today. We also reviewed his medications today. he has been encouraged to call the office with any questions or concerns  that should arise related to todays visit.   No orders of the defined types were placed in this encounter.   No orders of the defined types were placed in this encounter.   Time spent:30 Minutes    Johnna Acosta AGNP-C Nurse Practitioner

## 2022-03-29 ENCOUNTER — Ambulatory Visit: Payer: BC Managed Care – PPO | Admitting: Psychiatry

## 2022-03-29 ENCOUNTER — Encounter: Payer: Self-pay | Admitting: Psychiatry

## 2022-03-29 VITALS — BP 129/82 | HR 73 | Temp 98.3°F | Wt 311.0 lb

## 2022-03-29 DIAGNOSIS — G4700 Insomnia, unspecified: Secondary | ICD-10-CM

## 2022-03-29 DIAGNOSIS — Z79899 Other long term (current) drug therapy: Secondary | ICD-10-CM | POA: Insufficient documentation

## 2022-03-29 DIAGNOSIS — F411 Generalized anxiety disorder: Secondary | ICD-10-CM

## 2022-03-29 MED ORDER — SERTRALINE HCL 25 MG PO TABS: 25.0000 mg | ORAL_TABLET | Freq: Every day | ORAL | 1 refills | Status: DC

## 2022-03-29 NOTE — Patient Instructions (Signed)
[email or web address]  [email or web address]  [email or web address]   [facility] - [phone]   [facility] [phone]  [facility] - [phone]    Sertraline Tablets What is this medication? SERTRALINE (SER tra leen) treats depression, anxiety, obsessive-compulsive disorder (OCD), post-traumatic stress disorder (PTSD), and premenstrual dysphoric disorder (PMDD). It increases the amount of serotonin in the brain, a hormone that helps regulate mood. It belongs to a group of medications called SSRIs. This medicine may be used for other purposes; ask your health care provider or pharmacist if you have questions. COMMON BRAND NAME(S): Zoloft What should I tell my care team before I take this medication? They need to know if you have any of these conditions: Bleeding disorders Bipolar disorder or a family history of bipolar disorder Frequently drink alcohol Glaucoma Heart disease High blood pressure History of irregular heartbeat History of low levels of calcium, magnesium, or potassium in the blood Liver disease Receiving electroconvulsive therapy Seizures Suicidal thoughts, plans, or attempt; a previous suicide attempt by you or a family member Take medications that prevent or treat blood clots Thyroid disease An unusual or allergic reaction to sertraline, other medications, foods, dyes, or preservatives Pregnant or trying to get pregnant Breast-feeding How should I use this medication? Take this medication by mouth with a glass of water. Follow the directions on the prescription label. You can take it with or without food. Take your medication at regular intervals. Do not take your medication more often than directed. Do not stop taking this medication suddenly except upon the advice of your care team. Stopping this medication too quickly may cause serious side effects or your condition may worsen. A special MedGuide will be given to you by the  pharmacist with each prescription and refill. Be sure to read this information carefully each time. Talk to your care team about the use of this medication in children. While this medication may be prescribed for children as young as 7 years for selected conditions, precautions do apply. Overdosage: If you think you have taken too much of this medicine contact a poison control center or emergency room at once. NOTE: This medicine is only for you. Do not share this medicine with others. What if I miss a dose? If you miss a dose, take it as soon as you can. If it is almost time for your next dose, take only that dose. Do not take double or extra doses. What may interact with this medication? Do not take this medication with any of the following: Cisapride Dronedarone Linezolid MAOIs like Carbex, Eldepryl, Marplan, Nardil, and Parnate Methylene blue (injected into a vein) Pimozide Thioridazine This medication may also interact with the following: Alcohol Amphetamines Aspirin and aspirin-like medications Certain medications for depression, anxiety, or other mental health conditions Certain medications for fungal infections like ketoconazole, fluconazole, posaconazole, and itraconazole Certain medications for irregular heart beat like flecainide, quinidine, propafenone Certain medications for migraine headaches like almotriptan, eletriptan, frovatriptan, naratriptan, rizatriptan, sumatriptan, zolmitriptan Certain medications for sleep Certain medications for seizures like carbamazepine, valproic acid, phenytoin Certain medications that treat or prevent blood clots like warfarin, enoxaparin, dalteparin Cimetidine Digoxin Diuretics Fentanyl Isoniazid Lithium NSAIDs, medications for pain and inflammation, like ibuprofen or naproxen Other medications that prolong the QT interval (cause an abnormal heart rhythm) like dofetilide Rasagiline Safinamide Supplements like St. John's wort, kava  kava, valerian Tolbutamide Tramadol Tryptophan This list may not describe all possible interactions. Give your health care provider a  list of all the medicines, herbs, non-prescription drugs, or dietary supplements you use. Also tell them if you smoke, drink alcohol, or use illegal drugs. Some items may interact with your medicine. What should I watch for while using this medication? Tell your care team if your symptoms do not get better or if they get worse. Visit your care team for regular checks on your progress. Because it may take several weeks to see the full effects of this medication, it is important to continue your treatment as prescribed by your care team. Patients and their families should watch out for new or worsening thoughts of suicide or depression. Also watch out for sudden changes in feelings such as feeling anxious, agitated, panicky, irritable, hostile, aggressive, impulsive, severely restless, overly excited and hyperactive, or not being able to sleep. If this happens, especially at the beginning of treatment or after a change in dose, call your care team. This medication may affect your coordination, reaction time, or judgment. Do not drive or operate machinery until you know how this medication affects you. Sit or stand up slowly to reduce the risk of dizzy or fainting spells. Drinking alcohol with this medication can increase the risk of these side effects. Your mouth may get dry. Chewing sugarless gum or sucking hard candy, and drinking plenty of water may help. Contact your care team if the problem does not go away or is severe. What side effects may I notice from receiving this medication? Side effects that you should report to your care team as soon as possible: Allergic reactions--skin rash, itching, hives, swelling of the face, lips, tongue, or throat Bleeding--bloody or black, tar-like stools, red or dark brown urine, vomiting blood or brown material that looks like  coffee grounds, small red or purple spots on skin, unusual bleeding or bruising Heart rhythm changes--fast or irregular heartbeat, dizziness, feeling faint or lightheaded, chest pain, trouble breathing Low sodium level--muscle weakness, fatigue, dizziness, headache, confusion Serotonin syndrome--irritability, confusion, fast or irregular heartbeat, muscle stiffness, twitching muscles, sweating, high fever, seizure, chills, vomiting, diarrhea Sudden eye pain or change in vision such as blurred vision, seeing halos around lights, vision loss Thoughts of suicide or self-harm, worsening mood Side effects that usually do not require medical attention (report these to your care team if they continue or are bothersome): Change in sex drive or performance Diarrhea Excessive sweating Nausea Tremors or shaking Upset stomach This list may not describe all possible side effects. Call your doctor for medical advice about side effects. You may report side effects to FDA at 1-800-FDA-1088. Where should I keep my medication? Keep out of the reach of children and pets. Store at room temperature between 15 and 30 degrees C (59 and 86 degrees F). Get rid of any unused medication after the expiration date. To get rid of medications that are no longer needed or expired: Take the medication to a medication take-back program. Check with your pharmacy or law enforcement to find a location. If you cannot return the medication, check the label or package insert to see if the medication should be thrown out in the garbage or flushed down the toilet. If you are not sure, ask your care team. If it is safe to put in the trash, empty the medication out of the container. Mix the medication with cat litter, dirt, coffee grounds, or other unwanted substance. Seal the mixture in a bag or container. Put it in the trash. NOTE: This sheet is a summary. It may not  cover all possible information. If you have questions about this  medicine, talk to your doctor, pharmacist, or health care provider.  2023 Elsevier/Gold Standard (2007-09-15 00:00:00)

## 2022-03-29 NOTE — Progress Notes (Unsigned)
Psychiatric Initial Adult Assessment   Patient Identification: William Stuart MRN:  [id number] Date of Evaluation:  03/29/2022 Referral Source: Viviano Simas FNP  Chief Complaint:   Chief Complaint  Patient presents with   Establish Care: 41 year old Caucasian male with history of anxiety, presented for medication management.   Visit Diagnosis:    ICD-10-CM   1. GAD (generalized anxiety disorder)  F41.1 sertraline (ZOLOFT) 25 MG tablet    2. Insomnia, unspecified type  G47.00     3. High risk medication use  Z79.899 Sodium      History of Present Illness:  William Stuart is a 41 year old Caucasian male, married, employed, has a history of sinus arrhythmia, asthma, vertigo, GERD, morbid obesity, history of sleep apnea noncompliant with CPAP, presented to establish care.  Patient reports he has been struggling with anxiety since the past several years, getting worse since the past 2 years.  Reports worrying about things in general, worries about having health care problems like cardiac issues, or an aneurysm, has racing thoughts,comes up with worst case scenario often, getting worse since the past few years.  Patient reports he has never been in psychotherapy however was prescribed antidepressants/antianxiety agents like fluoxetine however stopped taking it after a week since he felt it was not beneficial.  Patient describes his anxiety symptoms as having chest tightness, racing heart rate, shortness of breath back pain and headaches.  Patient reports he has it on a regular basis.  He has gone through a lot of workup with cardiology as well as neurologist in the past.  Reports all workup came back okay.  Patient reports he continues to take metoprolol prescribed by his cardiology for racing heart rate which helps.  Patient also reports sleep issues, reports he goes to bed at 10:30 PM or 11 PM.  Patient reports he has no difficulty falling asleep.  However he wakes up after 4 to 5 hours of  sleep and is unable to fall back asleep.  Reports he may have had a couple of sleep studies in the past and does have a diagnosis of obstructive sleep apnea however did not tolerate the mask.  Patient denies any significant depression symptoms.  Denies any suicidality, homicidality or perceptual disturbances.  Patient denies any history of trauma.  Patient denies any other concerns today.   Associated Signs/Symptoms: Depression Symptoms:  insomnia, (Hypo) Manic Symptoms:   Denies Anxiety Symptoms:  Excessive Worry, Psychotic Symptoms:   Denies PTSD Symptoms: Negative  Past Psychiatric History: Patient does report a history of anxiety in the past.  Patient denies inpatient behavioral health admissions.  Denies suicide attempts.  Previous Psychotropic Medications: Yes Prozac-noncompliant  Substance Abuse History in the last 12 months:  No. Report using alcohol, 2 to 3,12 ounce beers per day.  Patient has been using it since the past several years, since college.  Denies any alcohol-related withdrawal symptoms or other problems.  Consequences of Substance Abuse: Negative  Past Medical History:  Past Medical History:  Diagnosis Date   Allergy    Anxiety    Asthma    GERD (gastroesophageal reflux disease)     Past Surgical History:  Procedure Laterality Date   ORCHIECTOMY     L orchiectomy and R orchiopexy for torsion   2002   PILONIDAL CYST EXCISION  2000 & 2001   3 times    Family Psychiatric History: As noted below.  Family History:  Family History  Problem Relation Age of Onset   Anxiety disorder  Mother    Migraines Mother    Celiac disease Mother    Crohn's disease Mother    Anxiety disorder Father    Migraines Father    Celiac disease Father    Anxiety disorder Sister    Alcohol abuse Paternal Grandfather    Cancer Neg Hx    Colon cancer Neg Hx    Esophageal cancer Neg Hx    Pancreatic cancer Neg Hx    Rectal cancer Neg Hx    Stomach cancer Neg Hx      Social History:   Social History   Socioeconomic History   Marital status: Married    Spouse name: alyssa   Number of children: 2   Years of education: Not on file   Highest education level: Master's degree (e.g., MA, MS, MEng, MEd, MSW, MBA)  Occupational History   Occupation: Ship broker engagement and alumni---teaches journalism    Employer: [employer]  Tobacco Use   Smoking status: Never   Smokeless tobacco: Never  Vaping Use   Vaping Use: Never used  Substance and Sexual Activity   Alcohol use: Yes    Alcohol/week: 25.0 standard drinks of alcohol    Types: 21 Cans of beer, 4 Shots of liquor per week    Comment: occasional   Drug use: No   Sexual activity: Yes  Other Topics Concern   Not on file  Social History Narrative   Not on file   Social Determinants of Health   Financial Resource Strain: Not on file  Food Insecurity: Not on file  Transportation Needs: Not on file  Physical Activity: Not on file  Stress: Not on file  Social Connections: Not on file    Additional Social History: Patient was born and raised in [place].  He was raised by both parents.  He has 1 sister.  Patient graduated high school, attended [employer], has a Scientist, water quality in [employer].  Currently works at [employer], [employer].  Patient has been married since the past 77 years, has 2 sons ages 40 and 29.  Denies any history of trauma.  Currently lives in [place].  Allergies:  No Known Allergies  Metabolic Disorder Labs: Lab Results  Component Value Date   HGBA1C 5.3 05/14/2021   No results found for: "PROLACTIN" Lab Results  Component Value Date   CHOL 185 10/21/2020   TRIG 190 (H) 10/21/2020   HDL 57 10/21/2020   CHOLHDL 3.2 10/21/2020   LDLCALC 96 10/21/2020   Lab Results  Component Value Date   TSH 1.419 06/01/2021    Therapeutic Level Labs: No results found for: "LITHIUM" No results found for: "CBMZ" No results found for: "VALPROATE"  Current  Medications: Current Outpatient Medications  Medication Sig Dispense Refill   omeprazole (PRILOSEC) 20 MG capsule TAKE 1 CAPSULE(20 MG) BY MOUTH DAILY 90 capsule 3   sertraline (ZOLOFT) 25 MG tablet Take 1 tablet (25 mg total) by mouth daily with breakfast. 30 tablet 1   metoprolol succinate (TOPROL-XL) 25 MG 24 hr tablet Take 1 tablet (25 mg total) by mouth daily for 7 days. 7 tablet 0   No current facility-administered medications for this visit.    Musculoskeletal: Strength & Muscle Tone: within normal limits Gait & Station: normal Patient leans: N/A  Psychiatric Specialty Exam: Review of Systems  Psychiatric/Behavioral:  Positive for sleep disturbance. The patient is nervous/anxious.   All other systems reviewed and are negative.   Blood pressure 129/82, pulse 73, temperature 98.3 F (36.8  C), temperature source Temporal, weight (!) 311 lb (141.1 kg).Body mass index is 39.93 kg/m.  General Appearance: Casual  Eye Contact:  Fair  Speech:  Clear and Coherent  Volume:  Normal  Mood:  Anxious  Affect:  Congruent  Thought Process:  Goal Directed and Descriptions of Associations: Intact  Orientation:  Full (Time, Place, and Person)  Thought Content:  Logical  Suicidal Thoughts:  No  Homicidal Thoughts:  No  Memory:  Immediate;   Fair Recent;   Fair Remote;   Fair  Judgement:  Fair  Insight:  Fair  Psychomotor Activity:  Normal  Concentration:  Concentration: Fair and Attention Span: Fair  Recall:  [employer] of Knowledge:Fair  Language: Fair  Akathisia:  No  Handed:  Right  AIMS (if indicated):  not done  Assets:  Communication Skills Desire for Improvement Housing Social Support  ADL's:  Intact  Cognition: WNL  Sleep:  Poor   Screenings: AUDIT    Flowsheet Row Office Visit from 03/29/2022 in [facility]  Alcohol Use Disorder Identification Test Final Score (AUDIT) 5      GAD-7    Flowsheet Row Office Visit from 03/29/2022 in  [facility]  Total GAD-7 Score 17      PHQ2-9    Flowsheet Row Office Visit from 03/29/2022 in [facility] Office Visit from 11/02/2020 in [facility] Visit from 03/19/2019 in [facility] [place]  PHQ-2 Total Score 2 0 0  PHQ-9 Total Score 7 -- --      Flowsheet Row Office Visit from 03/29/2022 in [facility] ED from 09/06/2021 in [facility] from 06/01/2021 in [facility]  C-SSRS RISK CATEGORY No Risk No Risk No Risk       Assessment and Plan: TAY WHITWELL is a 41 year old Caucasian male, employed, married, lives in [place], has a history of anxiety, obstructive sleep apnea noncompliant with CPAP, GERD, sinus arrhythmia, asthma, was evaluated in office today.  Patient is currently struggling with anxiety, sleep problems, will benefit from the following plan. The patient demonstrates the following risk factors for suicide: Chronic risk factors for suicide include: psychiatric disorder of anxiety . Acute risk factors for suicide include: N/A. Protective factors for this patient include: positive social support, positive therapeutic relationship, responsibility to others (children, family), coping skills, and hope for the future. Considering these factors, the overall suicide risk at this point appears to be low. Patient is appropriate for outpatient follow up.  Plan GAD-unstable Start sertraline 25 mg p.o. daily with breakfast Referred for CBT-provided information for therapist in the community.  Insomnia-unstable Patient advised to start using CPAP, has a history of obstructive sleep apnea, will have a discussion with primary care provider who had referred him for sleep studies in the past.  High risk medication use- Will repeat sodium level-history of hyponatremia per review  of medical records-BMP dated 09/06/2021.  Reviewed notes per cardiology-Dr. Illa Level 11/30/2021 as well as Dr. Alphonsus Sias 11/04/2021 - 05/14/2021.'   Follow-up in clinic in 3 to 4 weeks or sooner if needed.  This note was generated in part or whole with voice recognition software. Voice recognition is usually quite accurate but there are transcription errors that can and very often do occur. I apologize for any typographical errors that were not detected and corrected.      Jomarie Longs, MD 8/23/20238:24 AM

## 2022-03-29 NOTE — Progress Notes (Unsigned)
ERROR

## 2022-04-07 DIAGNOSIS — F411 Generalized anxiety disorder: Secondary | ICD-10-CM | POA: Diagnosis not present

## 2022-04-07 DIAGNOSIS — R079 Chest pain, unspecified: Secondary | ICD-10-CM | POA: Diagnosis not present

## 2022-04-07 DIAGNOSIS — R002 Palpitations: Secondary | ICD-10-CM | POA: Diagnosis not present

## 2022-04-28 ENCOUNTER — Ambulatory Visit: Payer: BC Managed Care – PPO | Admitting: Psychiatry

## 2022-05-09 DIAGNOSIS — F411 Generalized anxiety disorder: Secondary | ICD-10-CM | POA: Diagnosis not present

## 2022-05-09 DIAGNOSIS — R002 Palpitations: Secondary | ICD-10-CM | POA: Diagnosis not present

## 2022-05-09 DIAGNOSIS — R079 Chest pain, unspecified: Secondary | ICD-10-CM | POA: Diagnosis not present

## 2022-05-19 ENCOUNTER — Ambulatory Visit: Payer: BC Managed Care – PPO | Admitting: Psychiatry

## 2022-05-20 IMAGING — CT CT CARDIAC CORONARY ARTERY CALCIUM SCORE
3 series · 14 of 20 positions shown, 16 images · non-contrast
Comparison: 09/06/2021 chest radiograph

Addendum:
CLINICAL DATA: Risk stratification

EXAM:
Coronary Calcium Score
TECHNIQUE: The patient was scanned on a Siemens Somatom go.Top Scanner. Axial
non-contrast 3 mm slices were carried out through the heart. The
data set was analyzed on a dedicated work station and scored using
the Agatson method.

[Series 2: sa36 calcium scoring 3.00 · axial · 0.42mm/px · z∈[-1129,-1048]mm · 4 of 46 slices shown]
[im 10/46  vessel]
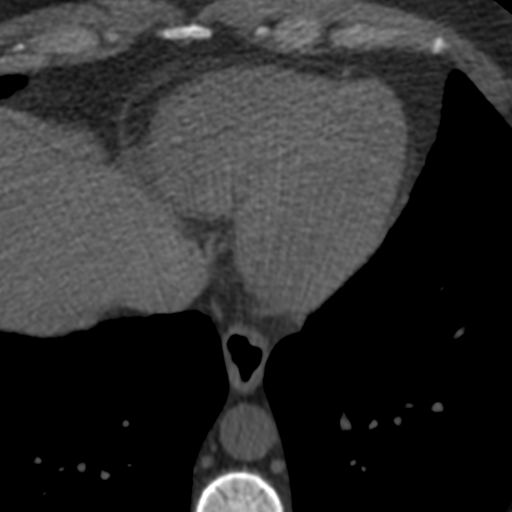
[im 19/46  vessel]
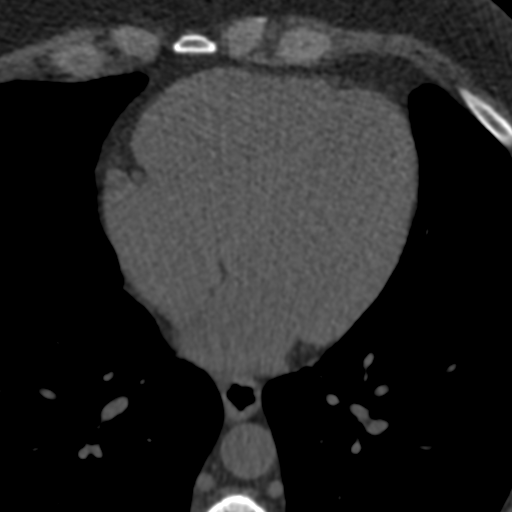
[im 28/46  vessel]
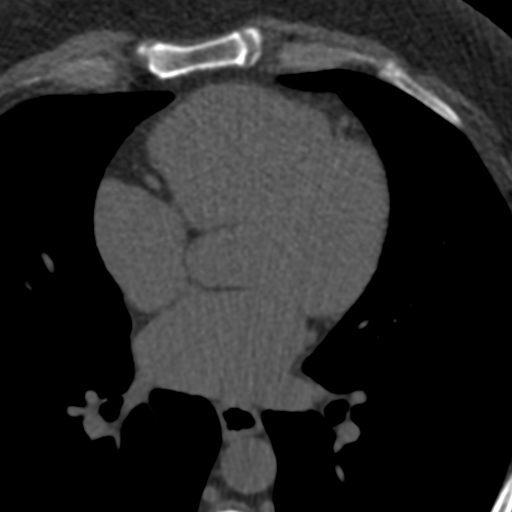
[im 37/46  vessel]
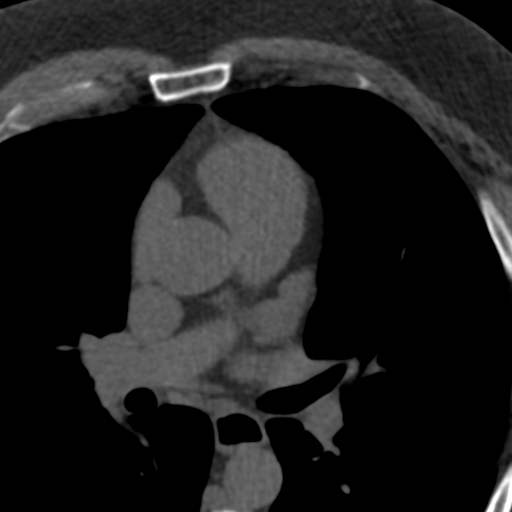

[Series 5: full fov st calcium scoring 3.00 · axial · 0.77mm/px · z∈[-1135,-1045]mm · 5 of 46 slices shown, 7 images]
[im 8/46  vessel]
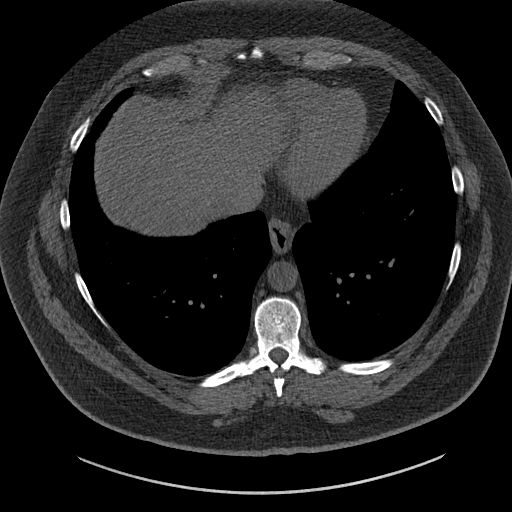
[im 8/46  lung]
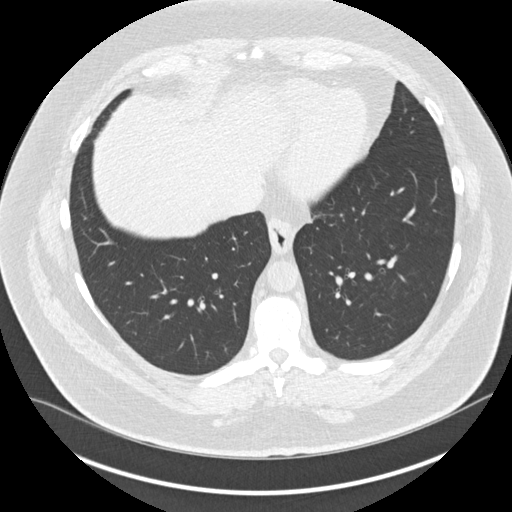
[im 16/46  vessel]
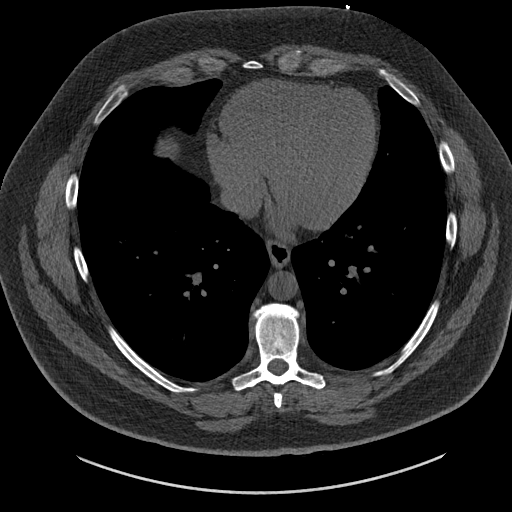
[im 23/46  vessel]
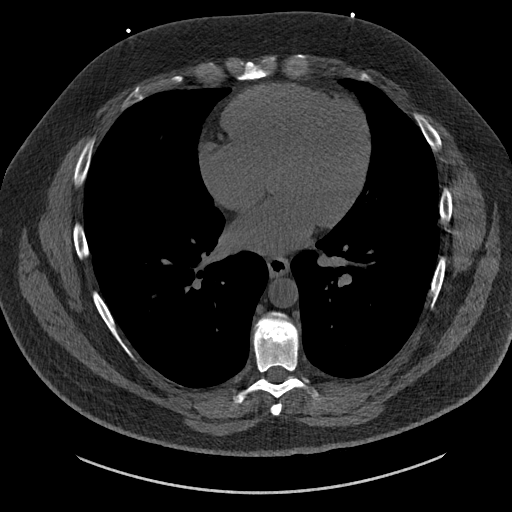
[im 31/46  vessel]
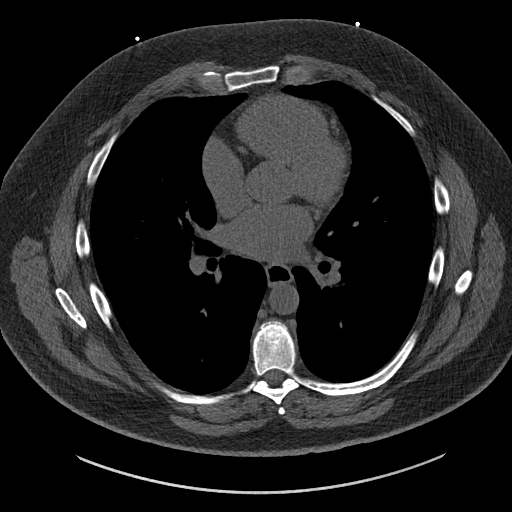
[im 38/46  vessel]
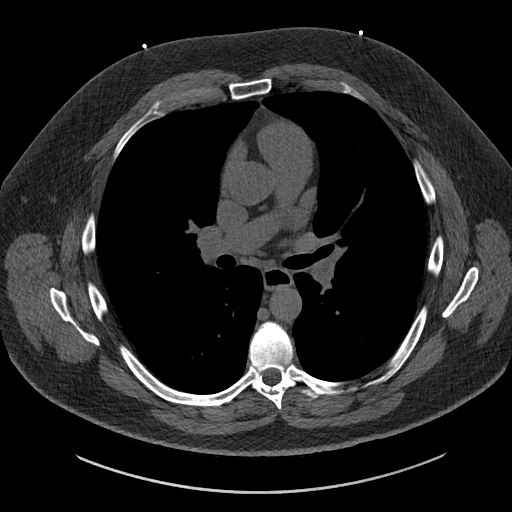
[im 38/46  lung]
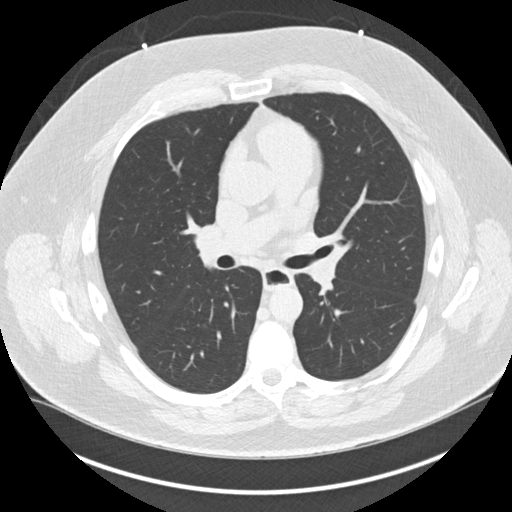

[Series 10: full fov lungs calcium scoring 3.00 ax · axial · 0.77mm/px · z∈[-1135,-1045]mm · 5 of 46 slices shown]
[im 8/46  vessel]
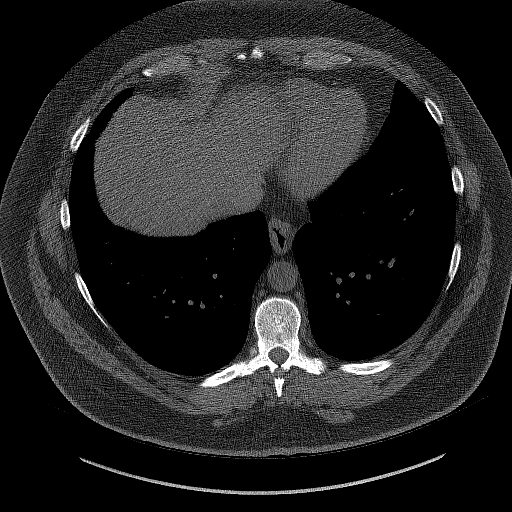
[im 16/46  vessel]
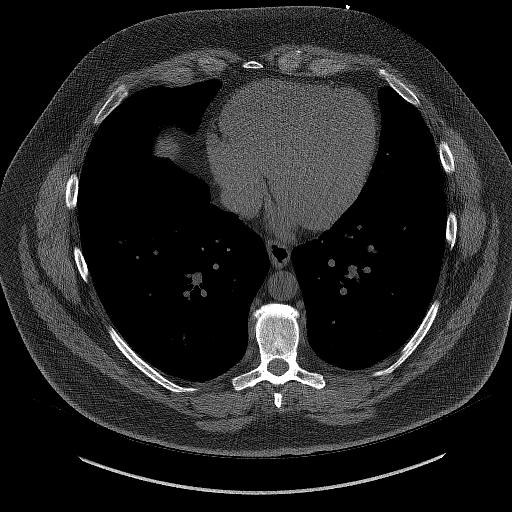
[im 23/46  vessel]
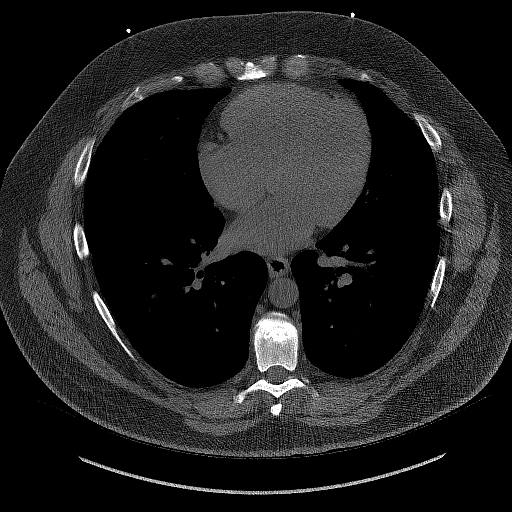
[im 31/46  vessel]
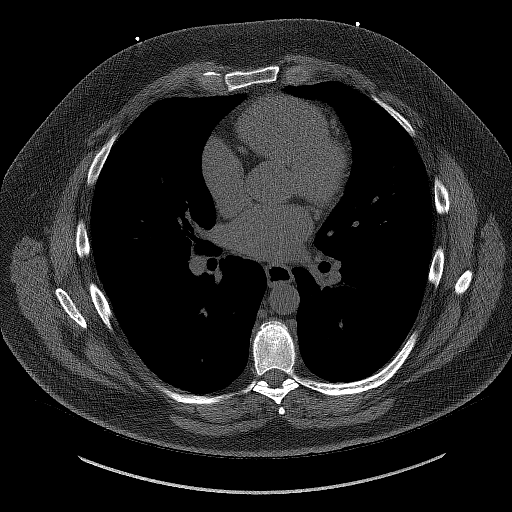
[im 38/46  vessel]
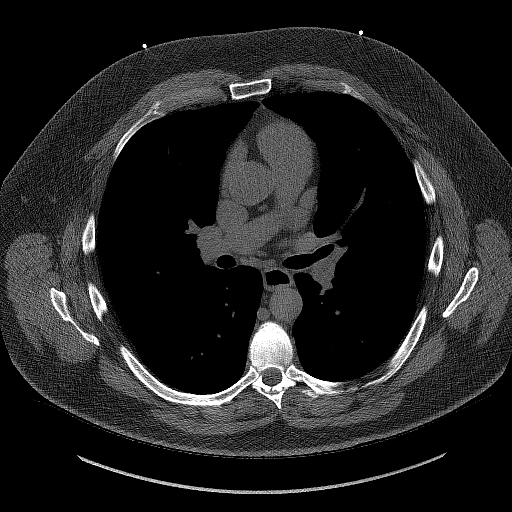

[14 of 20 positions shown; findings below may reference images not displayed]

FINDINGS: Non-cardiac: See separate report from [REDACTED].

Ascending Aorta: Normal size

Pericardium: Normal

Coronary arteries: Normal origin of left and right coronary
arteries. Distribution of arterial calcifications if present, as
noted below;

LM 0

LAD 0

LCx 0

RCA 0

Total 0

IMPRESSION AND RECOMMENDATION:
1. Normal coronary calcium score of 0. Patient is low risk for
coronary events.

2.  CAC 0, [place]0.

3.  Continue heart healthy lifestyle and risk factor modification.

EXAM:
OVER-READ INTERPRETATION  CT CHEST

The following report is an over-read performed by radiologist Dr.
Mayabel Tiger [REDACTED] on 10/22/2021. This over-read
does not include interpretation of cardiac or coronary anatomy or
pathology. The calcium score interpretation by the cardiologist is
attached.
FINDINGS: Vascular: Normal aortic caliber.

Mediastinum/Nodes: No imaged thoracic adenopathy.

Lungs/Pleura: No pleural fluid.  Clear imaged lungs.

Upper Abdomen: Normal imaged portions of the liver.

Musculoskeletal: No acute osseous abnormality.
IMPRESSION: No acute findings in the imaged extracardiac chest.

*** End of Addendum ***
FINDINGS: Non-cardiac: See separate report from [REDACTED].

Ascending Aorta: Normal size

Pericardium: Normal

Coronary arteries: Normal origin of left and right coronary
arteries. Distribution of arterial calcifications if present, as
noted below;

LM 0

LAD 0

LCx 0

RCA 0

Total 0

IMPRESSION AND RECOMMENDATION:
1. Normal coronary calcium score of 0. Patient is low risk for
coronary events.

2.  CAC 0, [place]0.

3.  Continue heart healthy lifestyle and risk factor modification.

## 2022-06-23 ENCOUNTER — Ambulatory Visit: Payer: BC Managed Care – PPO | Admitting: Psychiatry

## 2022-06-24 ENCOUNTER — Ambulatory Visit: Payer: BC Managed Care – PPO | Admitting: Internal Medicine

## 2022-06-24 ENCOUNTER — Encounter: Payer: Self-pay | Admitting: Internal Medicine

## 2022-06-24 VITALS — BP 122/88 | HR 86 | Temp 98.0°F | Ht 74.0 in | Wt 299.0 lb

## 2022-06-24 DIAGNOSIS — R5383 Other fatigue: Secondary | ICD-10-CM | POA: Diagnosis not present

## 2022-06-24 DIAGNOSIS — R002 Palpitations: Secondary | ICD-10-CM | POA: Diagnosis not present

## 2022-06-24 DIAGNOSIS — K219 Gastro-esophageal reflux disease without esophagitis: Secondary | ICD-10-CM

## 2022-06-24 DIAGNOSIS — F411 Generalized anxiety disorder: Secondary | ICD-10-CM

## 2022-06-24 LAB — CBC
HCT: 38.5 % — ABNORMAL LOW (ref 39.0–52.0)
Hemoglobin: 12.6 g/dL — ABNORMAL LOW (ref 13.0–17.0)
MCHC: 32.7 g/dL (ref 30.0–36.0)
MCV: 87.8 fl (ref 78.0–100.0)
Platelets: 283 10*3/uL (ref 150.0–400.0)
RBC: 4.39 Mil/uL (ref 4.22–5.81)
RDW: 14.8 % (ref 11.5–15.5)
WBC: 5.8 10*3/uL (ref 4.0–10.5)

## 2022-06-24 LAB — COMPREHENSIVE METABOLIC PANEL
ALT: 23 U/L (ref 0–53)
AST: 31 U/L (ref 0–37)
Albumin: 4.4 g/dL (ref 3.5–5.2)
Alkaline Phosphatase: 78 U/L (ref 39–117)
BUN: 10 mg/dL (ref 6–23)
CO2: 27 mEq/L (ref 19–32)
Calcium: 9.2 mg/dL (ref 8.4–10.5)
Chloride: 102 mEq/L (ref 96–112)
Creatinine, Ser: 1.06 mg/dL (ref 0.40–1.50)
GFR: 87.53 mL/min (ref >60.00)
Glucose, Bld: 89 mg/dL (ref 70–99)
Potassium: 4.4 mEq/L (ref 3.5–5.1)
Sodium: 137 mEq/L (ref 135–145)
Total Bilirubin: 0.4 mg/dL (ref 0.2–1.2)
Total Protein: 8 g/dL (ref 6.0–8.3)

## 2022-06-24 LAB — TSH: TSH: 1.21 u[IU]/mL (ref 0.35–5.50)

## 2022-06-24 LAB — HEMOGLOBIN A1C: Hgb A1c MFr Bld: 5.5 % (ref 4.6–6.5)

## 2022-06-24 NOTE — Assessment & Plan Note (Signed)
Having palpitations and sweats after eating Did have benign zio monitor--so no SVT Is on metoprolol 25 Discussed hyperinsulinism---needs to limit carbs

## 2022-06-24 NOTE — Assessment & Plan Note (Signed)
Hasn't tried the sertraline 25 If labs okay, he probably needs to try it (symptoms may be somatic from the anxiety) He thinks he may need it anyway Discussed that the dose will likely need to be titrated

## 2022-06-24 NOTE — Progress Notes (Signed)
Subjective:    Patient ID: William Stuart, male    DOB: 1981-05-05, 41 y.o.   MRN: [id number]  HPI Here due to not feeling well  Is concerned about his blood sugar Fatigued and run down Feels dehydrated Feels sweaty when eating--especially with sweet things Gets sense of palpitations (racing) after eating Mat GF---died with HTN, heart trouble (and he sweat when he ate)  This goes back a month or so Recent visit with cardiologist--[place] Had been given diltiazem by a PA---but never filled  Anxiety is not good Did go to psychiatrist---didn't find that visit helpful Got Rx for sertraline--but didn't start it  Current Outpatient Medications on File Prior to Visit  Medication Sig Dispense Refill   omeprazole (PRILOSEC) 20 MG capsule TAKE 1 CAPSULE(20 MG) BY MOUTH DAILY 90 capsule 3   metoprolol succinate (TOPROL-XL) 25 MG 24 hr tablet Take 1 tablet (25 mg total) by mouth daily for 7 days. 7 tablet 0   sertraline (ZOLOFT) 25 MG tablet Take 1 tablet (25 mg total) by mouth daily with breakfast. (Patient not taking: Reported on 06/24/2022) 30 tablet 1   No current facility-administered medications on file prior to visit.    No Known Allergies  Past Medical History:  Diagnosis Date   Allergy    Anxiety    Asthma    GERD (gastroesophageal reflux disease)     Past Surgical History:  Procedure Laterality Date   ORCHIECTOMY     L orchiectomy and R orchiopexy for torsion   2002   PILONIDAL CYST EXCISION  2000 & 2001   3 times    Family History  Problem Relation Age of Onset   Anxiety disorder Mother    Migraines Mother    Celiac disease Mother    Crohn's disease Mother    Anxiety disorder Father    Migraines Father    Celiac disease Father    Anxiety disorder Sister    Alcohol abuse Paternal Grandfather    Cancer Neg Hx    Colon cancer Neg Hx    Esophageal cancer Neg Hx    Pancreatic cancer Neg Hx    Rectal cancer Neg Hx    Stomach cancer Neg Hx     Social  History   Socioeconomic History   Marital status: Married    Spouse name: alyssa   Number of children: 2   Years of education: Not on file   Highest education level: Master's degree (e.g., MA, MS, MEng, MEd, MSW, MBA)  Occupational History   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    Employer: [employer]  Tobacco Use   Smoking status: Never    Passive exposure: Never   Smokeless tobacco: Never  Vaping Use   Vaping Use: Never used  Substance and Sexual Activity   Alcohol use: Yes    Alcohol/week: 25.0 standard drinks of alcohol    Types: 21 Cans of beer, 4 Shots of liquor per week    Comment: occasional   Drug use: No   Sexual activity: Yes  Other Topics Concern   Not on file  Social History Narrative   Not on file   Social Determinants of Health   Financial Resource Strain: Not on file  Food Insecurity: Not on file  Transportation Needs: Not on file  Physical Activity: Not on file  Stress: Not on file  Social Connections: Not on file  Intimate Partner Violence: Not on file   Review of Systems Thirsty and drinking more  Sleeps okay mostly--some nights he has awakening in middle of the night Weight is stable No chest pain No sig SOB---gets episodic dyspnea (like when heart racing after eating) Work is okay--difficult this semester No stress at home    Objective:   Physical Exam Constitutional:      Appearance: Normal appearance.  Cardiovascular:     Rate and Rhythm: Normal rate and regular rhythm.     Pulses: Normal pulses.     Heart sounds: No murmur heard.    No gallop.  Pulmonary:     Effort: Pulmonary effort is normal.     Breath sounds: Normal breath sounds. No wheezing or rales.  Abdominal:     Palpations: Abdomen is soft.     Tenderness: There is no abdominal tenderness.  Musculoskeletal:     Cervical back: Neck supple.     Right lower leg: No edema.     Left lower leg: No edema.  Lymphadenopathy:     Cervical: No  cervical adenopathy.  Neurological:     Mental Status: He is alert.  Psychiatric:        Mood and Affect: Mood normal.        Behavior: Behavior normal.            Assessment & Plan:

## 2022-06-24 NOTE — Assessment & Plan Note (Signed)
Doesn't seem to be exacerbated with these symptoms Does take the omeprazole daily

## 2022-06-24 NOTE — Assessment & Plan Note (Signed)
We are both concerned about new diabetes Will check the labs

## 2022-06-26 ENCOUNTER — Other Ambulatory Visit: Payer: Self-pay | Admitting: Internal Medicine

## 2022-06-26 DIAGNOSIS — D649 Anemia, unspecified: Secondary | ICD-10-CM

## 2022-07-04 ENCOUNTER — Encounter: Payer: Self-pay | Admitting: Internal Medicine

## 2022-07-04 ENCOUNTER — Other Ambulatory Visit (INDEPENDENT_AMBULATORY_CARE_PROVIDER_SITE_OTHER): Payer: BC Managed Care – PPO | Admitting: *Deleted

## 2022-07-04 DIAGNOSIS — D649 Anemia, unspecified: Secondary | ICD-10-CM | POA: Diagnosis not present

## 2022-07-04 LAB — FECAL OCCULT BLOOD, IMMUNOCHEMICAL: Fecal Occult Bld: NEGATIVE

## 2022-08-09 ENCOUNTER — Ambulatory Visit (INDEPENDENT_AMBULATORY_CARE_PROVIDER_SITE_OTHER)
Admission: RE | Admit: 2022-08-09 | Discharge: 2022-08-09 | Disposition: A | Discharge status: Home / Self Care | Payer: BC Managed Care – PPO | Source: Ambulatory Visit | Attending: Internal Medicine | Admitting: Internal Medicine

## 2022-08-09 ENCOUNTER — Encounter: Payer: Self-pay | Admitting: Internal Medicine

## 2022-08-09 ENCOUNTER — Ambulatory Visit: Payer: BC Managed Care – PPO | Admitting: Internal Medicine

## 2022-08-09 VITALS — BP 134/88 | HR 78 | Temp 97.0°F | Ht 74.0 in | Wt 308.0 lb

## 2022-08-09 DIAGNOSIS — R053 Chronic cough: Secondary | ICD-10-CM

## 2022-08-09 DIAGNOSIS — R062 Wheezing: Secondary | ICD-10-CM | POA: Diagnosis not present

## 2022-08-09 DIAGNOSIS — R059 Cough, unspecified: Secondary | ICD-10-CM | POA: Diagnosis not present

## 2022-08-09 MED ORDER — ALBUTEROL SULFATE HFA 108 (90 BASE) MCG/ACT IN AERS: 2.0000 | INHALATION_SPRAY | Freq: Four times a day (QID) | RESPIRATORY_TRACT | 1 refills | Status: DC | PRN

## 2022-08-09 MED ORDER — DOXYCYCLINE HYCLATE 100 MG PO TABS: 100.0000 mg | ORAL_TABLET | Freq: Two times a day (BID) | ORAL | 0 refills | Status: DC

## 2022-08-09 NOTE — Assessment & Plan Note (Addendum)
Doubt bacterial pneumonia Could be sequelae of viral infection--like RSV Has asthma history---could be mild persistent bronchospasm---but hasn't done well with prednisone in the past Will check CXR CXR looks normal---will await overread Will give albuterol HFA and doxy 100 bid x 7 days

## 2022-08-09 NOTE — Progress Notes (Signed)
Subjective:    Patient ID: William Stuart, male    DOB: [birth year], 42 y.o.   MRN: [id number]  HPI Here due to ongoing cough  May have gotten sick from kids Started with cough about a month ago---"hasn't been able to shake it" Had been more in the day--but also affecting his sleep Can feel some wheezing in chest and feels "it in my chest" Notes some easy DOE--just walking around   No fever No chills ---some mild sweats No headache Just mild nasal congestion--some discharge No sore throat No ear pain  Kids were COVID negative  Hasn't tried anything---other than cough drops  Current Outpatient Medications on File Prior to Visit  Medication Sig Dispense Refill   omeprazole (PRILOSEC) 20 MG capsule TAKE 1 CAPSULE(20 MG) BY MOUTH DAILY 90 capsule 3   metoprolol succinate (TOPROL-XL) 25 MG 24 hr tablet Take 1 tablet (25 mg total) by mouth daily for 7 days. 7 tablet 0   sertraline (ZOLOFT) 25 MG tablet Take 1 tablet (25 mg total) by mouth daily with breakfast. (Patient not taking: Reported on 06/24/2022) 30 tablet 1   No current facility-administered medications on file prior to visit.    No Known Allergies  Past Medical History:  Diagnosis Date   Allergy    Anxiety    Asthma    GERD (gastroesophageal reflux disease)     Past Surgical History:  Procedure Laterality Date   ORCHIECTOMY     L orchiectomy and R orchiopexy for torsion   2002   PILONIDAL CYST EXCISION  2000 & 2001   3 times    Family History  Problem Relation Age of Onset   Anxiety disorder Mother    Migraines Mother    Celiac disease Mother    Crohn's disease Mother    Anxiety disorder Father    Migraines Father    Celiac disease Father    Anxiety disorder Sister    Alcohol abuse Paternal Grandfather    Cancer Neg Hx    Colon cancer Neg Hx    Esophageal cancer Neg Hx    Pancreatic cancer Neg Hx    Rectal cancer Neg Hx    Stomach cancer Neg Hx     Social History   Socioeconomic History    Marital status: Married    Spouse name: alyssa   Number of children: 2   Years of education: Not on file   Highest education level: Master's degree (e.g., MA, MS, MEng, MEd, MSW, MBA)  Occupational History   Occupation: Ship broker engagement and alumni---teaches journalism    Employer: [employer]  Tobacco Use   Smoking status: Never    Passive exposure: Never   Smokeless tobacco: Never  Vaping Use   Vaping Use: Never used  Substance and Sexual Activity   Alcohol use: Yes    Alcohol/week: 25.0 standard drinks of alcohol    Types: 21 Cans of beer, 4 Shots of liquor per week    Comment: occasional   Drug use: No   Sexual activity: Yes  Other Topics Concern   Not on file  Social History Narrative   Not on file   Social Determinants of Health   Financial Resource Strain: Not on file  Food Insecurity: Not on file  Transportation Needs: Not on file  Physical Activity: Not on file  Stress: Not on file  Social Connections: Not on file  Intimate Partner Violence: Not on file   Review of Systems Had asthma as child--mostly  exercise induced No N/V Eating okay---appetite is off some    Objective:   Physical Exam Constitutional:      Appearance: Normal appearance.  HENT:     Head:     Comments: No sinus symptoms    Ears:     Comments: Cerumen bilaterally    Mouth/Throat:     Pharynx: No oropharyngeal exudate or posterior oropharyngeal erythema.  Pulmonary:     Breath sounds: No wheezing or rales.     Comments: Coarse cough Fair air movement Not really tight Musculoskeletal:     Cervical back: Neck supple.  Lymphadenopathy:     Cervical: No cervical adenopathy.  Neurological:     Mental Status: He is alert.            Assessment & Plan:

## 2022-09-07 ENCOUNTER — Encounter: Payer: Self-pay | Admitting: Internal Medicine

## 2022-10-16 ENCOUNTER — Other Ambulatory Visit: Payer: Self-pay | Admitting: Internal Medicine

## 2022-11-08 ENCOUNTER — Encounter: Payer: Self-pay | Admitting: Internal Medicine

## 2022-11-08 ENCOUNTER — Ambulatory Visit (INDEPENDENT_AMBULATORY_CARE_PROVIDER_SITE_OTHER): Payer: BC Managed Care – PPO | Admitting: Internal Medicine

## 2022-11-08 VITALS — BP 130/88 | HR 88 | Temp 97.1°F | Ht 74.0 in | Wt 302.0 lb

## 2022-11-08 DIAGNOSIS — R002 Palpitations: Secondary | ICD-10-CM | POA: Diagnosis not present

## 2022-11-08 DIAGNOSIS — Z0000 Encounter for general adult medical examination without abnormal findings: Secondary | ICD-10-CM

## 2022-11-08 DIAGNOSIS — F411 Generalized anxiety disorder: Secondary | ICD-10-CM | POA: Diagnosis not present

## 2022-11-08 DIAGNOSIS — K219 Gastro-esophageal reflux disease without esophagitis: Secondary | ICD-10-CM | POA: Diagnosis not present

## 2022-11-08 NOTE — Assessment & Plan Note (Signed)
Ongoing symptoms----affecting life and likely causing the palpitations He will try the sertraline (he thinks) ----discussed that I would increase in 1 month if no problems but no relief

## 2022-11-08 NOTE — Assessment & Plan Note (Signed)
Discussed trying occasional skipped days on the omeprazole

## 2022-11-08 NOTE — Assessment & Plan Note (Signed)
Persists despite the metoprolol Going back to cardiology next month

## 2022-11-08 NOTE — Assessment & Plan Note (Signed)
Healthy Discussed working on fitness No cancer screening yet (FIT negative --done due to low hemoglobin still) Flu vaccine in the fall and consider updated COVID

## 2022-11-08 NOTE — Progress Notes (Signed)
Subjective:    Patient ID: William Stuart, male    DOB: [birth year], 41 y.o.   MRN: [id number]  HPI Here for physical  Seems to be over the respiratory symptoms  Decided not to try the sertraline Not really depressed Ongoing anxiety symptoms---like anxious/SOB in movie theater recently  Still feels heart rate elevated at rest Seems to go up after eating---makes him fidgety Is going back to cardiologist soon Walking more--but "not enough"  Current Outpatient Medications on File Prior to Visit  Medication Sig Dispense Refill   metoprolol succinate (TOPROL-XL) 25 MG 24 hr tablet Take 1 tablet (25 mg total) by mouth daily for 7 days. 7 tablet 0   omeprazole (PRILOSEC) 20 MG capsule TAKE 1 CAPSULE(20 MG) BY MOUTH DAILY 90 capsule 0   No current facility-administered medications on file prior to visit.    No Known Allergies  Past Medical History:  Diagnosis Date   Allergy    Anxiety    Asthma    GERD (gastroesophageal reflux disease)     Past Surgical History:  Procedure Laterality Date   ORCHIECTOMY     L orchiectomy and R orchiopexy for torsion   2002   PILONIDAL CYST EXCISION  2000 & 2001   3 times    Family History  Problem Relation Age of Onset   Anxiety disorder Mother    Migraines Mother    Celiac disease Mother    Crohn's disease Mother    Anxiety disorder Father    Migraines Father    Celiac disease Father    Anxiety disorder Sister    Alcohol abuse Paternal Grandfather    Cancer Neg Hx    Colon cancer Neg Hx    Esophageal cancer Neg Hx    Pancreatic cancer Neg Hx    Rectal cancer Neg Hx    Stomach cancer Neg Hx     Social History   Socioeconomic History   Marital status: Married    Spouse name: alyssa   Number of children: 2   Years of education: Not on file   Highest education level: Master's degree (e.g., MA, MS, MEng, MEd, MSW, MBA)  Occupational History   Occupation: Ship broker engagement and alumni---teaches journalism    Employer:  [employer]  Tobacco Use   Smoking status: Never    Passive exposure: Never   Smokeless tobacco: Never  Vaping Use   Vaping Use: Never used  Substance and Sexual Activity   Alcohol use: Yes    Alcohol/week: 25.0 standard drinks of alcohol    Types: 21 Cans of beer, 4 Shots of liquor per week    Comment: occasional   Drug use: No   Sexual activity: Yes  Other Topics Concern   Not on file  Social History Narrative   Not on file   Social Determinants of Health   Financial Resource Strain: Not on file  Food Insecurity: Not on file  Transportation Needs: Not on file  Physical Activity: Not on file  Stress: Not on file  Social Connections: Not on file  Intimate Partner Violence: Not on file   Review of Systems  Constitutional:  Negative for fatigue and unexpected weight change.       Wears seat belt  HENT:  Negative for dental problem, hearing loss and tinnitus.        Keeps up with dentist  Eyes:  Negative for redness and visual disturbance.  Respiratory:  Negative for cough, chest tightness and shortness of breath.  Cardiovascular:  Positive for palpitations. Negative for chest pain and leg swelling.  Gastrointestinal:  Negative for blood in stool and constipation.       No heartburn if taking the omeprazole daily  Endocrine: Negative for polydipsia and polyuria.  Genitourinary:  Negative for difficulty urinating and urgency.       No sexual problems  Musculoskeletal:  Negative for arthralgias, back pain and joint swelling.  Skin:  Negative for rash.  Allergic/Immunologic: Positive for environmental allergies. Negative for immunocompromised state.       No meds in general  Neurological:  Negative for dizziness, syncope, light-headedness and headaches.  Hematological:  Negative for adenopathy. Does not bruise/bleed easily.  Psychiatric/Behavioral:  Negative for dysphoric mood. The patient is nervous/anxious.        Some trouble with night awakening        Objective:   Physical Exam Constitutional:      Appearance: Normal appearance.  HENT:     Mouth/Throat:     Pharynx: No oropharyngeal exudate or posterior oropharyngeal erythema.  Eyes:     Conjunctiva/sclera: Conjunctivae normal.     Pupils: Pupils are equal, round, and reactive to light.  Cardiovascular:     Rate and Rhythm: Normal rate and regular rhythm.     Pulses: Normal pulses.     Heart sounds:     No gallop.  Pulmonary:     Effort: Pulmonary effort is normal.     Breath sounds: Normal breath sounds. No wheezing or rales.  Abdominal:     Palpations: Abdomen is soft.     Tenderness: There is no abdominal tenderness.  Musculoskeletal:     Cervical back: Neck supple.     Right lower leg: No edema.     Left lower leg: No edema.  Lymphadenopathy:     Cervical: No cervical adenopathy.  Skin:    Findings: No lesion or rash.  Neurological:     General: No focal deficit present.     Mental Status: He is alert and oriented to person, place, and time.  Psychiatric:        Mood and Affect: Mood normal.        Behavior: Behavior normal.            Assessment & Plan:

## 2023-01-05 DIAGNOSIS — R002 Palpitations: Secondary | ICD-10-CM | POA: Diagnosis not present

## 2023-01-05 DIAGNOSIS — E669 Obesity, unspecified: Secondary | ICD-10-CM | POA: Diagnosis not present

## 2023-01-05 DIAGNOSIS — F419 Anxiety disorder, unspecified: Secondary | ICD-10-CM | POA: Diagnosis not present

## 2023-01-05 DIAGNOSIS — G4733 Obstructive sleep apnea (adult) (pediatric): Secondary | ICD-10-CM | POA: Diagnosis not present

## 2023-01-09 ENCOUNTER — Other Ambulatory Visit: Payer: Self-pay | Admitting: Internal Medicine

## 2023-01-17 ENCOUNTER — Ambulatory Visit: Payer: BC Managed Care – PPO | Admitting: Internal Medicine

## 2023-02-17 ENCOUNTER — Ambulatory Visit: Payer: BC Managed Care – PPO | Admitting: Internal Medicine

## 2023-03-09 ENCOUNTER — Ambulatory Visit (INDEPENDENT_AMBULATORY_CARE_PROVIDER_SITE_OTHER): Payer: Self-pay | Admitting: Adult Health

## 2023-03-09 ENCOUNTER — Encounter: Payer: Self-pay | Admitting: Adult Health

## 2023-03-09 VITALS — BP 122/100 | HR 74 | Temp 97.5°F | Ht 74.0 in | Wt 305.0 lb

## 2023-03-09 DIAGNOSIS — R42 Dizziness and giddiness: Secondary | ICD-10-CM

## 2023-03-09 DIAGNOSIS — R232 Flushing: Secondary | ICD-10-CM

## 2023-03-09 DIAGNOSIS — L749 Eccrine sweat disorder, unspecified: Secondary | ICD-10-CM

## 2023-03-09 DIAGNOSIS — E611 Iron deficiency: Secondary | ICD-10-CM

## 2023-03-09 NOTE — Progress Notes (Signed)
[facility] [address] [place], [place] [address]   Office Visit Note  Patient Name: William Stuart Date of Birth [id number]  Medical Record number [id number]  Date of Service: 03/10/2023  Chief Complaint  Patient presents with   Acute Visit    Patient c/o feeling hungry most of the time, lightheadedness, facial flushing, and sweating He is concerned that his blood sugars may be running low. Symptoms have been present intermittently over the past 3 weeks but seemed to have worsened since Monday.     HPI Pt is here for a sick visit. Patient reports for about a month he has been having episodes of Hunger/flushed head.  He has had some episodes of excessive sweating.  Denies any syncope or near syncope.  Some mild dizziness  He reports a history of anemia.  And low iron in the past.  His PCP did stools samples which did not show blood.  Also has never supplemented his Iron in the past.    Current Medication:  Outpatient Encounter Medications as of 03/09/2023  Medication Sig   metoprolol succinate (TOPROL-XL) 25 MG 24 hr tablet Take 25 mg by mouth daily.   omeprazole (PRILOSEC) 20 MG capsule TAKE 1 CAPSULE(20 MG) BY MOUTH DAILY   [DISCONTINUED] metoprolol succinate (TOPROL-XL) 25 MG 24 hr tablet Take 1 tablet (25 mg total) by mouth daily for 7 days.   No facility-administered encounter medications on file as of 03/09/2023.      Medical History: Past Medical History:  Diagnosis Date   Allergy    Anxiety    Asthma    GERD (gastroesophageal reflux disease)      Vital Signs: BP (!) 122/100 (BP Location: Left Arm, Patient Position: Sitting, Cuff Size: Large)   Pulse 74   Temp (!) 97.5 F (36.4 C)   Ht 6\' 2"  (1.88 m)   Wt (!) 305 lb (138.3 kg)   SpO2 97%   BMI 39.16 kg/m    Review of Systems  Constitutional:  Positive for fatigue.  HENT:  Negative for ear discharge, ear pain and sore throat.   Respiratory:  Negative for cough.   Cardiovascular:  Negative for  chest pain and palpitations.  Gastrointestinal:  Negative for diarrhea, nausea and vomiting.  Endocrine: Positive for heat intolerance.  Neurological:  Positive for dizziness and light-headedness. Negative for syncope and weakness.    Physical Exam Vitals reviewed.  Constitutional:      Appearance: Normal appearance.  HENT:     Head: Normocephalic.     Right Ear: Tympanic membrane normal.     Left Ear: Tympanic membrane normal.     Nose: Nose normal.     Mouth/Throat:     Mouth: Mucous membranes are moist.     Pharynx: No oropharyngeal exudate or posterior oropharyngeal erythema.  Eyes:     General:        Right eye: No discharge.        Left eye: No discharge.     Extraocular Movements: Extraocular movements intact.     Pupils: Pupils are equal, round, and reactive to light.  Cardiovascular:     Rate and Rhythm: Normal rate and regular rhythm.     Pulses: Normal pulses.     Heart sounds: Normal heart sounds. No murmur heard. Pulmonary:     Effort: Pulmonary effort is normal. No respiratory distress.     Breath sounds: Normal breath sounds. No wheezing or rhonchi.  Musculoskeletal:  General: No swelling or deformity. Normal range of motion.     Cervical back: Normal range of motion.  Skin:    General: Skin is warm and dry.     Capillary Refill: Capillary refill takes less than 2 seconds.  Neurological:     General: No focal deficit present.     Mental Status: He is alert.     Cranial Nerves: No cranial nerve deficit.     Gait: Gait normal.  Psychiatric:        Mood and Affect: Mood normal.        Behavior: Behavior normal.        Judgment: Judgment normal.    Assessment/Plan: 1. Light-headed feeling Discuss Lab results when available. Focus on Hydration over the weekend, and return to clinic at the beginning of the week for BP check.  - Ferritin - Iron and TIBC - Comprehensive metabolic panel - J19 and Folate Panel - VITAMIN D 25 Hydroxy (Vit-D Deficiency,  Fractures) - TSH - CBC with Differential/Platelet  2. Low iron - Ferritin - Iron and TIBC  3. Flushed complexion - Ferritin - Iron and TIBC - Comprehensive metabolic panel - J47 and Folate Panel - VITAMIN D 25 Hydroxy (Vit-D Deficiency, Fractures) - TSH - CBC with Differential/Platelet  4. Sweating abnormality - Ferritin - Iron and TIBC - Comprehensive metabolic panel - W29 and Folate Panel - VITAMIN D 25 Hydroxy (Vit-D Deficiency, Fractures) - TSH - CBC with Differential/Platelet     General Counseling: [name] verbalizes understanding of the findings of todays visit and agrees with plan of treatment. I have discussed any further diagnostic evaluation that may be needed or ordered today. We also reviewed his medications today. he has been encouraged to call the office with any questions or concerns that should arise related to todays visit.   Orders Placed This Encounter  Procedures   Ferritin   Iron and TIBC   Comprehensive metabolic panel   F62 and Folate Panel   VITAMIN D 25 Hydroxy (Vit-D Deficiency, Fractures)   TSH   CBC with Differential/Platelet    No orders of the defined types were placed in this encounter.   Time spent:20 Minutes    Johnna Acosta AGNP-C Nurse Practitioner

## 2023-03-10 ENCOUNTER — Encounter: Payer: Self-pay | Admitting: Internal Medicine

## 2023-03-10 ENCOUNTER — Other Ambulatory Visit: Payer: Self-pay | Admitting: Adult Health

## 2023-03-10 DIAGNOSIS — R710 Precipitous drop in hematocrit: Secondary | ICD-10-CM

## 2023-03-13 ENCOUNTER — Encounter: Payer: Self-pay | Admitting: Adult Health

## 2023-03-15 LAB — CELIAC DISEASE PANEL
Endomysial IgA: NEGATIVE
Immunoglobulin A, (IgA) QN, Serum: 391 mg/dL — ABNORMAL HIGH (ref 90–386)
t-Transglutaminase (tTG) IgA: <2 U/mL (ref 0–3)

## 2023-03-15 LAB — SPECIMEN STATUS REPORT

## 2023-03-17 ENCOUNTER — Ambulatory Visit: Payer: BC Managed Care – PPO | Admitting: Physician Assistant

## 2023-03-17 ENCOUNTER — Encounter: Payer: Self-pay | Admitting: Physician Assistant

## 2023-03-17 VITALS — BP 135/84 | HR 102 | Temp 98.1°F | Ht 74.0 in | Wt 308.8 lb

## 2023-03-17 DIAGNOSIS — D649 Anemia, unspecified: Secondary | ICD-10-CM | POA: Diagnosis not present

## 2023-03-17 MED ORDER — PEG 3350-KCL-NA BICARB-NACL 420 G PO SOLR: 4000.0000 mL | Freq: Once | ORAL | 0 refills | Status: AC

## 2023-03-17 NOTE — Progress Notes (Signed)
Celso Amy, PA-C [address]  [address]  [place], [place] [address]  Main: [phone]  Fax: [phone]   Gastroenterology Consultation  Referring Provider:     Karie Schwalbe, MD Primary Care Physician:  Karie Schwalbe, MD Primary Gastroenterologist:  Celso Amy, PA-C / Dr. Wyline Mood   Reason for Consultation:     Anemia        HPI:   William Stuart is a 42 y.o. y/o male referred for consultation & management  by Karie Schwalbe, MD. Referred to evaluate anemia.  Patient saw Hardie Shackleton, NP, at [facility] to evaluate weakness 03/09/2023. Lab 03/09/2023 showed hemoglobin 10.9, hematocrit 34, MCV 82, platelet 281.  Normal iron 119, iron saturation 27%, and ferritin 38.  Normal folate 10.8 and B12 of 427.  No evidence of iron, B12, or folate deficiency.  Celiac labs negative.  Normal TSH.  Was referred to GI to further evaluate anemia.  Lab 06/2022 showed hemoglobin 12.6, hematocrit 38.5, MCV 87.  Fecal occult stool test negative x 2.  Baseline hemoglobin has been between 13 and 14 for the past few years.  RUQ ultrasound 02/2019 (to evaluate epigastric pain) was normal.  No gallstones.  No previous EGD, colonoscopy, or GI evaluation.  No previous hematology evaluation. Denies family history of colon, esophageal, or stomach cancer.  Patient does not take any iron or B12 supplements.  No NSAIDS use.  No GI symptoms. Denies abdominal pain dysphagia, diarrhea, constipation, melena, hematochezia, or weight loss.  Taking omeprazole 20 Mg once daily with good control of acid reflux.  Admits to moderate alcohol use.  Past Medical History:  Diagnosis Date   Allergy    Anxiety    Asthma    GERD (gastroesophageal reflux disease)     Past Surgical History:  Procedure Laterality Date   ORCHIECTOMY     L orchiectomy and R orchiopexy for torsion   2002   PILONIDAL CYST EXCISION  2000 & 2001   3 times    Prior to Admission medications   Medication Sig  Start Date End Date Taking? Authorizing Provider  metoprolol succinate (TOPROL-XL) 25 MG 24 hr tablet Take 25 mg by mouth daily. 05/30/22   [provider]  omeprazole (PRILOSEC) 20 MG capsule TAKE 1 CAPSULE(20 MG) BY MOUTH DAILY 01/09/23   Karie Schwalbe, MD    Family History  Problem Relation Age of Onset   Anxiety disorder Mother    Migraines Mother    Celiac disease Mother    Crohn's disease Mother    Anxiety disorder Father    Migraines Father    Celiac disease Father    Anxiety disorder Sister    Alcohol abuse Paternal Grandfather    Cancer Neg Hx    Colon cancer Neg Hx    Esophageal cancer Neg Hx    Pancreatic cancer Neg Hx    Rectal cancer Neg Hx    Stomach cancer Neg Hx      Social History   Tobacco Use   Smoking status: Never    Passive exposure: Never   Smokeless tobacco: Never  Vaping Use   Vaping status: Never Used  Substance Use Topics   Alcohol use: Yes    Alcohol/week: 25.0 standard drinks of alcohol    Types: 21 Cans of beer, 4 Shots of liquor per week    Comment: occasional   Drug use: No    Allergies as of 03/17/2023   (No  Known Allergies)    Review of Systems:    All systems reviewed and negative except where noted in HPI.   Physical Exam:  BP 135/84   Pulse (!) 102   Temp 98.1 F (36.7 C)   Ht 6\' 2"  (1.88 m)   Wt (!) 308 lb 12.8 oz (140.1 kg)   BMI 39.65 kg/m  No LMP for male patient. Psych:  Alert and cooperative. Normal mood and affect. General:   Alert,  Well-developed, well-nourished, pleasant and cooperative in NAD Head:  Normocephalic and atraumatic. Lungs:  Respirations even and unlabored.  Clear throughout to auscultation.   No wheezes, crackles, or rhonchi. No acute distress. Heart:  Regular rate and rhythm; no murmurs, clicks, rubs, or gallops. Abdomen:  Normal bowel sounds.  No bruits.  Soft, and non-distended without masses, hepatosplenomegaly or hernias noted.  No Tenderness.  No guarding or rebound tenderness.     Neurologic:  Alert and oriented x3;  grossly normal neurologically. Psych:  Alert and cooperative. Anxious mood and affect.  Imaging Studies: No results found.  Assessment and Plan:   [name] is a 42 y.o. y/o male has been referred for:  Anemia: Normocytic.  Recent iron, B12, and folate were normal.  Celiac labs negative.       Anemia could be related to excessive alcohol use?  Repeat Lab to Verify Results: CBC, Iron panal, ferritin, B12.  Scheduling EGD & Colonoscopy to rule out GI source for anemia. I discussed risks of EGD and colonoscopy with patient to include risk of bleeding, perforation, and risk of sedation.  Patient expressed understanding and agrees to proceed with procedures.    If EGD/Colon are unrevealing and anemia persists, then refer to Hematologist.  Follow up based on EGD and colonoscopy results.  Celso Amy, PA-C

## 2023-03-19 ENCOUNTER — Encounter: Payer: Self-pay | Admitting: Physician Assistant

## 2023-03-21 ENCOUNTER — Telehealth: Payer: Self-pay

## 2023-03-21 DIAGNOSIS — D649 Anemia, unspecified: Secondary | ICD-10-CM

## 2023-03-21 NOTE — Telephone Encounter (Signed)
Pt called requesting call back regarding the Mychart msg sent yesterday... I gave information below... pt states he wants labs rechecked sooner than 4 weeks and does not want to wait that long...   Celso Amy, PA-C  to Martyn Ehrich, [facility]     03/20/23  2:06 PM Call and notify patient: Yes, we can repeat labs.  I recommend schedule lab visit in 4 weeks to check CBC, iron panel, ferritin.  Diagnosis iron deficiency anemia.  I still recommend he take OTC iron tablet once daily for the next 4 weeks. Celso Amy, PA-C

## 2023-03-21 NOTE — Telephone Encounter (Signed)
Labs ordered and printed -left message letting patient know he can come this week for repeat labs. CBC, iron panel.

## 2023-03-22 ENCOUNTER — Other Ambulatory Visit: Payer: Self-pay | Admitting: Internal Medicine

## 2023-03-22 ENCOUNTER — Ambulatory Visit: Payer: BC Managed Care – PPO | Admitting: Internal Medicine

## 2023-03-22 ENCOUNTER — Encounter: Payer: Self-pay | Admitting: Internal Medicine

## 2023-03-22 VITALS — BP 122/88 | HR 88 | Temp 97.8°F | Ht 74.0 in | Wt 312.0 lb

## 2023-03-22 DIAGNOSIS — D509 Iron deficiency anemia, unspecified: Secondary | ICD-10-CM | POA: Diagnosis not present

## 2023-03-22 LAB — CBC
HCT: 34.5 % — ABNORMAL LOW (ref 39.0–52.0)
Hemoglobin: 10.8 g/dL — ABNORMAL LOW (ref 13.0–17.0)
MCHC: 31.3 g/dL (ref 30.0–36.0)
MCV: 82.7 fl (ref 78.0–100.0)
Platelets: 276 10*3/uL (ref 150.0–400.0)
RBC: 4.17 Mil/uL — ABNORMAL LOW (ref 4.22–5.81)
RDW: 17.3 % — ABNORMAL HIGH (ref 11.5–15.5)
WBC: 6.3 10*3/uL (ref 4.0–10.5)

## 2023-03-22 LAB — IBC + FERRITIN
Ferritin: 16.4 ng/mL — ABNORMAL LOW (ref 22.0–322.0)
Iron: 68 ug/dL (ref 42–165)
Saturation Ratios: 13.6 % — ABNORMAL LOW (ref 20.0–50.0)
TIBC: 498.4 ug/dL — ABNORMAL HIGH (ref 250.0–450.0)
Transferrin: 356 mg/dL (ref 212.0–360.0)

## 2023-03-22 NOTE — Progress Notes (Signed)
Subjective:    Patient ID: William Stuart, male    DOB: [birth year], 42 y.o.   MRN: [id number]  HPI Here with concerns about recent labs and anemia  Reviewed his recent labs and GI evaluation Had anemia--but normal iron studies Repeat did show iron depletion and low ferritin  No change in bowels No dark stools Occasional faint upper abdominal pain---vague  Current Outpatient Medications on File Prior to Visit  Medication Sig Dispense Refill   metoprolol succinate (TOPROL-XL) 25 MG 24 hr tablet Take 25 mg by mouth daily.     omeprazole (PRILOSEC) 20 MG capsule TAKE 1 CAPSULE(20 MG) BY MOUTH DAILY 90 capsule 0   No current facility-administered medications on file prior to visit.    No Known Allergies  Past Medical History:  Diagnosis Date   Allergy    Anxiety    Asthma    GERD (gastroesophageal reflux disease)     Past Surgical History:  Procedure Laterality Date   ORCHIECTOMY     L orchiectomy and R orchiopexy for torsion   2002   PILONIDAL CYST EXCISION  2000 & 2001   3 times    Family History  Problem Relation Age of Onset   Anxiety disorder Mother    Migraines Mother    Celiac disease Mother    Crohn's disease Mother    Anxiety disorder Father    Migraines Father    Celiac disease Father    Anxiety disorder Sister    Alcohol abuse Paternal Grandfather    Cancer Neg Hx    Colon cancer Neg Hx    Esophageal cancer Neg Hx    Pancreatic cancer Neg Hx    Rectal cancer Neg Hx    Stomach cancer Neg Hx     Social History   Socioeconomic History   Marital status: Married    Spouse name: alyssa   Number of children: 2   Years of education: Not on file   Highest education level: Master's degree (e.g., MA, MS, MEng, MEd, MSW, MBA)  Occupational History   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    Employer: [employer]  Tobacco Use   Smoking status: Never    Passive exposure: Never   Smokeless tobacco: Never  Vaping Use    Vaping status: Never Used  Substance and Sexual Activity   Alcohol use: Yes    Alcohol/week: 25.0 standard drinks of alcohol    Types: 21 Cans of beer, 4 Shots of liquor per week    Comment: occasional   Drug use: No   Sexual activity: Yes  Other Topics Concern   Not on file  Social History Narrative   Not on file   Social Determinants of Health   Financial Resource Strain: Low Risk  (03/21/2023)   Overall Financial Resource Strain (CARDIA)    Difficulty of Paying Living Expenses: Not very hard  Food Insecurity: No Food Insecurity (03/21/2023)   Hunger Vital Sign    Worried About Running Out of Food in the Last Year: Never true    Ran Out of Food in the Last Year: Never true  Transportation Needs: No Transportation Needs (03/21/2023)   PRAPARE - Administrator, Civil Service (Medical): No    Lack of Transportation (Non-Medical): No  Physical Activity: Insufficiently Active (03/21/2023)   Exercise Vital Sign    Days of Exercise per Week: 3 days    Minutes of Exercise per Session: 10 min  Stress: Stress Concern Present (  03/21/2023)   [employer] of Occupational Health - Occupational Stress Questionnaire    Feeling of Stress : Rather much  Social Connections: Socially Isolated (03/21/2023)   Social Connection and Isolation Panel [NHANES]    Frequency of Communication with Friends and Family: Once a week    Frequency of Social Gatherings with Friends and Family: Once a week    Attends Religious Services: Never    Database administrator or Organizations: No    Attends Engineer, structural: Not on file    Marital Status: Married  Catering manager Violence: Not on file   Review of Systems Appetite is fine Weight fairly stable     Objective:   Physical Exam Constitutional:      Appearance: Normal appearance.  Neurological:     Mental Status: He is alert.  Psychiatric:        Mood and Affect: Mood normal.        Behavior: Behavior normal.             Assessment & Plan:

## 2023-03-22 NOTE — Assessment & Plan Note (Signed)
Last iron studies were what would be expected---initially the normal iron studies suggested a possible bone marrow etiology. It doesn't make sense for that change to occur over a week (without visible blood loss) He requests repeat labs to confirm---and I think this is reasonable ---but he needs the endoscopy/colon regardless He is going to start the iron supplements

## 2023-03-23 ENCOUNTER — Telehealth: Payer: Self-pay

## 2023-03-23 NOTE — Telephone Encounter (Signed)
Spoke with patient -currently scheduled 05-03-23 w/Dr.Anna--- procedures are scheduled for 04-20-23 with Dr.Wohl [facility] . New bowel prep instructions will be mailed today.

## 2023-04-03 ENCOUNTER — Encounter: Payer: Self-pay | Admitting: Internal Medicine

## 2023-04-19 ENCOUNTER — Encounter: Payer: Self-pay | Admitting: Gastroenterology

## 2023-04-20 ENCOUNTER — Encounter
Admission: RE | Disposition: A | Discharge status: Home / Self Care | Payer: Self-pay | Source: Home / Self Care | Attending: Gastroenterology

## 2023-04-20 ENCOUNTER — Encounter: Payer: Self-pay | Admitting: Gastroenterology

## 2023-04-20 ENCOUNTER — Ambulatory Visit
Admission: RE | Admit: 2023-04-20 | Discharge: 2023-04-20 | Disposition: A | Discharge status: Home / Self Care | Payer: BC Managed Care – PPO | Attending: Gastroenterology | Admitting: Gastroenterology

## 2023-04-20 ENCOUNTER — Ambulatory Visit: Payer: BC Managed Care – PPO | Admitting: Anesthesiology

## 2023-04-20 ENCOUNTER — Other Ambulatory Visit: Payer: Self-pay

## 2023-04-20 DIAGNOSIS — K219 Gastro-esophageal reflux disease without esophagitis: Secondary | ICD-10-CM | POA: Insufficient documentation

## 2023-04-20 DIAGNOSIS — Z6841 Body Mass Index (BMI) 40.0 and over, adult: Secondary | ICD-10-CM | POA: Insufficient documentation

## 2023-04-20 DIAGNOSIS — K449 Diaphragmatic hernia without obstruction or gangrene: Secondary | ICD-10-CM | POA: Diagnosis not present

## 2023-04-20 DIAGNOSIS — D509 Iron deficiency anemia, unspecified: Secondary | ICD-10-CM | POA: Diagnosis not present

## 2023-04-20 DIAGNOSIS — F411 Generalized anxiety disorder: Secondary | ICD-10-CM | POA: Diagnosis not present

## 2023-04-20 DIAGNOSIS — J45909 Unspecified asthma, uncomplicated: Secondary | ICD-10-CM | POA: Insufficient documentation

## 2023-04-20 DIAGNOSIS — G4733 Obstructive sleep apnea (adult) (pediatric): Secondary | ICD-10-CM | POA: Diagnosis not present

## 2023-04-20 DIAGNOSIS — D649 Anemia, unspecified: Secondary | ICD-10-CM

## 2023-04-20 HISTORY — DX: Cardiac arrhythmia, unspecified: I49.9

## 2023-04-20 HISTORY — PX: ESOPHAGOGASTRODUODENOSCOPY (EGD) WITH PROPOFOL: SHX5813

## 2023-04-20 HISTORY — PX: COLONOSCOPY WITH PROPOFOL: SHX5780

## 2023-04-20 SURGERY — COLONOSCOPY WITH PROPOFOL
Anesthesia: General

## 2023-04-20 MED ORDER — PROPOFOL 1000 MG/100ML IV EMUL
INTRAVENOUS | Status: AC
  Filled 2023-04-20: qty 100

## 2023-04-20 MED ORDER — PROPOFOL 10 MG/ML IV BOLUS
INTRAVENOUS | Status: AC
  Filled 2023-04-20: qty 20

## 2023-04-20 MED ORDER — PROPOFOL 10 MG/ML IV BOLUS
INTRAVENOUS | Status: DC | PRN
  Administered 2023-04-20: 50 mg via INTRAVENOUS
  Administered 2023-04-20: 100 mg via INTRAVENOUS
  Administered 2023-04-20: 40 mg via INTRAVENOUS
  Administered 2023-04-20: 200 mg via INTRAVENOUS

## 2023-04-20 MED ORDER — DEXMEDETOMIDINE HCL IN NACL 80 MCG/20ML IV SOLN
INTRAVENOUS | Status: DC | PRN
Start: 2023-04-20 — End: 2023-04-20
  Administered 2023-04-20: 8 ug via INTRAVENOUS
  Administered 2023-04-20: 4 ug via INTRAVENOUS
  Administered 2023-04-20: 8 ug via INTRAVENOUS

## 2023-04-20 MED ORDER — LIDOCAINE HCL (PF) 2 % IJ SOLN
INTRAMUSCULAR | Status: AC
  Filled 2023-04-20: qty 5

## 2023-04-20 MED ORDER — PROPOFOL 500 MG/50ML IV EMUL
INTRAVENOUS | Status: DC | PRN
  Administered 2023-04-20: 100 ug/kg/min via INTRAVENOUS

## 2023-04-20 MED ORDER — LIDOCAINE HCL (CARDIAC) PF 100 MG/5ML IV SOSY
PREFILLED_SYRINGE | INTRAVENOUS | Status: DC | PRN
  Administered 2023-04-20: 100 mg via INTRAVENOUS

## 2023-04-20 MED ORDER — SODIUM CHLORIDE 0.9 % IV SOLN: INTRAVENOUS | Status: DC

## 2023-04-20 NOTE — Anesthesia Postprocedure Evaluation (Signed)
Anesthesia Post Note  Patient: AANAV BREDBENNER  Procedure(s) Performed: COLONOSCOPY WITH PROPOFOL ESOPHAGOGASTRODUODENOSCOPY (EGD) WITH PROPOFOL  Patient location during evaluation: PACU Anesthesia Type: General Level of consciousness: awake and alert, oriented and patient cooperative Pain management: pain level controlled Vital Signs Assessment: post-procedure vital signs reviewed and stable Respiratory status: spontaneous breathing, nonlabored ventilation and respiratory function stable Cardiovascular status: blood pressure returned to baseline and stable Postop Assessment: adequate PO intake Anesthetic complications: no   No notable events documented.   Last Vitals:  Vitals:   04/20/23 0832 04/20/23 0842  BP: 130/82 138/88  Pulse: 84 78  Resp: 19 19  Temp:    SpO2: 97% 98%    Last Pain:  Vitals:   04/20/23 0842  TempSrc:   PainSc: 0-No pain                 Reed Breech

## 2023-04-20 NOTE — Transfer of Care (Signed)
Immediate Anesthesia Transfer of Care Note  Patient: William Stuart  Procedure(s) Performed: COLONOSCOPY WITH PROPOFOL ESOPHAGOGASTRODUODENOSCOPY (EGD) WITH PROPOFOL  Patient Location: Endoscopy Unit  Anesthesia Type:General  Level of Consciousness: drowsy and patient cooperative  Airway & Oxygen Therapy: Patient Spontanous Breathing and Patient connected to face mask oxygen  Post-op Assessment: Report given to RN and Patient moving all extremities X 4  Post vital signs: Reviewed and stable  Last Vitals:  Vitals Value Taken Time  BP 120/74 04/20/23 0822  Temp    Pulse 81 04/20/23 0823  Resp 15 04/20/23 0823  SpO2 96 % 04/20/23 0823  Vitals shown include unfiled device data.  Last Pain:  Vitals:   04/20/23 0733  TempSrc: Temporal  PainSc: 0-No pain         Complications: No notable events documented.

## 2023-04-20 NOTE — H&P (Signed)
William Minium, MD [facility] [address], [address] [place], [place] [address] Phone:[phone] Fax : [phone]  Primary Care Physician:  Karie Schwalbe, MD Primary Gastroenterologist:  Dr. Servando Snare  Pre-Procedure History & Physical: HPI:  William Stuart is a 42 y.o. male is here for an endoscopy and colonoscopy.   Past Medical History:  Diagnosis Date   Allergy    Anxiety    Asthma    Dysrhythmia    GERD (gastroesophageal reflux disease)     Past Surgical History:  Procedure Laterality Date   ORCHIECTOMY     L orchiectomy and R orchiopexy for torsion   2002   PILONIDAL CYST EXCISION  2000 & 2001   3 times    Prior to Admission medications   Medication Sig Start Date End Date Taking? Authorizing Provider  metoprolol succinate (TOPROL-XL) 25 MG 24 hr tablet Take 25 mg by mouth daily. 05/30/22  Yes [provider]  omeprazole (PRILOSEC) 20 MG capsule TAKE 1 CAPSULE(20 MG) BY MOUTH DAILY 03/22/23  Yes Karie Schwalbe, MD    Allergies as of 03/17/2023   (No Known Allergies)    Family History  Problem Relation Age of Onset   Anxiety disorder Mother    Migraines Mother    Celiac disease Mother    Crohn's disease Mother    Anxiety disorder Father    Migraines Father    Celiac disease Father    Anxiety disorder Sister    Alcohol abuse Paternal Grandfather    Cancer Neg Hx    Colon cancer Neg Hx    Esophageal cancer Neg Hx    Pancreatic cancer Neg Hx    Rectal cancer Neg Hx    Stomach cancer Neg Hx     Social History   Socioeconomic History   Marital status: Married    Spouse name: alyssa   Number of children: 2   Years of education: Not on file   Highest education level: Master's degree (e.g., MA, MS, MEng, MEd, MSW, MBA)  Occupational History   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    Employer: [employer]  Tobacco Use   Smoking status: Never    Passive exposure: Never   Smokeless tobacco: Never  Vaping Use    Vaping status: Never Used  Substance and Sexual Activity   Alcohol use: Yes    Alcohol/week: 25.0 standard drinks of alcohol    Types: 21 Cans of beer, 4 Shots of liquor per week    Comment: occasional   Drug use: No   Sexual activity: Yes  Other Topics Concern   Not on file  Social History Narrative   Not on file   Social Determinants of Health   Financial Resource Strain: Low Risk  (03/21/2023)   Overall Financial Resource Strain (CARDIA)    Difficulty of Paying Living Expenses: Not very hard  Food Insecurity: No Food Insecurity (03/21/2023)   Hunger Vital Sign    Worried About Running Out of Food in the Last Year: Never true    Ran Out of Food in the Last Year: Never true  Transportation Needs: No Transportation Needs (03/21/2023)   PRAPARE - Administrator, Civil Service (Medical): No    Lack of Transportation (Non-Medical): No  Physical Activity: Insufficiently Active (03/21/2023)   Exercise Vital Sign    Days of Exercise per Week: 3 days    Minutes of Exercise per Session: 10 min  Stress: Stress Concern Present (03/21/2023)  [employer] of Occupational Health - Occupational Stress Questionnaire    Feeling of Stress : Rather much  Social Connections: Socially Isolated (03/21/2023)   Social Connection and Isolation Panel [NHANES]    Frequency of Communication with Friends and Family: Once a week    Frequency of Social Gatherings with Friends and Family: Once a week    Attends Religious Services: Never    Database administrator or Organizations: No    Attends Engineer, structural: Not on file    Marital Status: Married  Catering manager Violence: Not on file    Review of Systems: See HPI, otherwise negative ROS  Physical Exam: BP 138/88   Pulse 78   Temp 97.6 F (36.4 C) (Temporal)   Resp 19   Ht 6\' 2"  (1.88 m)   Wt (!) 138.2 kg   SpO2 98%   BMI 39.11 kg/m  General:   Alert,  pleasant and cooperative in NAD Head:  Normocephalic and  atraumatic. Neck:  Supple; no masses or thyromegaly. Lungs:  Clear throughout to auscultation.    Heart:  Regular rate and rhythm. Abdomen:  Soft, nontender and nondistended. Normal bowel sounds, without guarding, and without rebound.   Neurologic:  Alert and  oriented x4;  grossly normal neurologically.  Impression/Plan: [name] is here for an endoscopy and colonoscopy to be performed for anemia  Risks, benefits, limitations, and alternatives regarding  endoscopy and colonoscopy have been reviewed with the patient.  Questions have been answered.  All parties agreeable.   William Minium, MD  04/20/2023, 2:27 PM

## 2023-04-20 NOTE — Op Note (Signed)
[facility] Gastroenterology Patient Name: William Stuart Procedure Date: 04/20/2023 7:28 AM MRN: [id number] Account #: [id number] Date of Birth: 08-14-1980 Admit Type: Outpatient Age: 42 Room: [facility] ENDO ROOM 3 Gender: Male Note Status: Finalized Instrument Name: Upper Endoscope [id number] Procedure:             Upper GI endoscopy Indications:           Iron deficiency anemia Providers:             Midge Minium MD, MD Referring MD:          Karie Schwalbe (Referring MD) Medicines:             Propofol per Anesthesia Complications:         No immediate complications. Procedure:             Pre-Anesthesia Assessment:                        - Prior to the procedure, a History and Physical was                         performed, and patient medications and allergies were                         reviewed. The patient's tolerance of previous                         anesthesia was also reviewed. The risks and benefits                         of the procedure and the sedation options and risks                         were discussed with the patient. All questions were                         answered, and informed consent was obtained. Prior                         Anticoagulants: The patient has taken no anticoagulant                         or antiplatelet agents. ASA Grade Assessment: II - A                         patient with mild systemic disease. After reviewing                         the risks and benefits, the patient was deemed in                         satisfactory condition to undergo the procedure.                        After obtaining informed consent, the endoscope was                         passed under direct vision. Throughout the procedure,  the patient's blood pressure, pulse, and oxygen                         saturations were monitored continuously. The Endoscope                         was introduced through the mouth, and  advanced to the                         second part of duodenum. The upper GI endoscopy was                         accomplished without difficulty. The patient tolerated                         the procedure well. Findings:      A medium-sized hiatal hernia was present.      The stomach was normal.      The examined duodenum was normal. Impression:            - Medium-sized hiatal hernia.                        - Normal stomach.                        - Normal examined duodenum.                        - No specimens collected. Recommendation:        - Discharge patient to home.                        - Resume previous diet.                        - Continue present medications.                        - Perform a colonoscopy today. Procedure Code(s):     --- Professional ---                        [phone], Esophagogastroduodenoscopy, flexible,                         transoral; diagnostic, including collection of                         specimen(s) by brushing or washing, when performed                         (separate procedure) Diagnosis Code(s):     --- Professional ---                        D50.9, Iron deficiency anemia, unspecified CPT copyright 2022 American Medical Association. All rights reserved. The codes documented in this report are preliminary and upon coder review may  be revised to meet current compliance requirements. Midge Minium MD, MD 04/20/2023 8:02:47 AM This report has been signed electronically. Number of Addenda: 0 Note Initiated On: 04/20/2023 7:28 AM Estimated Blood Loss:  Estimated blood  loss: none.      [facility]

## 2023-04-20 NOTE — Op Note (Signed)
[name] Procedure Date: 04/20/2023 7:26 AM MRN: [id number] Account #: [id number] Date of Birth: March 08, 1981 Admit Type: Outpatient Age: 42 Room: [facility] ENDO ROOM 3 Gender: Male Note Status: Finalized Instrument Name: Prentice Docker [id number] Procedure:             Colonoscopy Indications:           Iron deficiency anemia Providers:             Midge Minium MD, MD Referring MD:          Karie Schwalbe (Referring MD) Medicines:             Propofol per Anesthesia Complications:         No immediate complications. Procedure:             Pre-Anesthesia Assessment:                        - Prior to the procedure, a History and Physical was                         performed, and patient medications and allergies were                         reviewed. The patient's tolerance of previous                         anesthesia was also reviewed. The risks and benefits                         of the procedure and the sedation options and risks                         were discussed with the patient. All questions were                         answered, and informed consent was obtained. Prior                         Anticoagulants: The patient has taken no anticoagulant                         or antiplatelet agents. ASA Grade Assessment: II - A                         patient with mild systemic disease. After reviewing                         the risks and benefits, the patient was deemed in                         satisfactory condition to undergo the procedure.                        After obtaining informed consent, the colonoscope was                         passed under direct vision. Throughout the procedure,  the patient's blood pressure, pulse, and oxygen                         saturations were monitored continuously. The                         Colonoscope was introduced through the anus and                          advanced to the the cecum, identified by appendiceal                         orifice and ileocecal valve. The colonoscopy was                         performed without difficulty. The patient tolerated                         the procedure well. The quality of the bowel                         preparation was excellent. Findings:      The perianal and digital rectal examinations were normal.      The entire examined colon appeared normal. Impression:            - The entire examined colon is normal.                        - No specimens collected. Recommendation:        - Discharge patient to home.                        - Resume previous diet.                        - Continue present medications.                        - To visualize the small bowel, perform video capsule                         endoscopy at appointment to be scheduled. Procedure Code(s):     --- Professional ---                        [phone], Colonoscopy, flexible; diagnostic, including                         collection of specimen(s) by brushing or washing, when                         performed (separate procedure) Diagnosis Code(s):     --- Professional ---                        D50.9, Iron deficiency anemia, unspecified CPT copyright 2022 American Medical Association. All rights reserved. The codes documented in this report are preliminary and upon coder review may  be revised to meet current compliance requirements. Midge Minium MD, MD 04/20/2023 8:21:27 AM This report has been signed electronically. Number of Addenda: 0  Note Initiated On: 04/20/2023 7:26 AM Scope Withdrawal Time: 0 hours 7 minutes 58 seconds  Total Procedure Duration: 0 hours 13 minutes 21 seconds  Estimated Blood Loss:  Estimated blood loss: none.      [facility]

## 2023-04-20 NOTE — Anesthesia Procedure Notes (Addendum)
Procedure Name: General with mask airway Date/Time: 04/20/2023 7:53 AM  Performed by: Lily Lovings, CRNAPre-anesthesia Checklist: Patient identified, Emergency Drugs available, Suction available, Patient being monitored and Timeout performed Patient Re-evaluated:Patient Re-evaluated prior to induction Oxygen Delivery Method: Simple face mask Preoxygenation: Pre-oxygenation with 100% oxygen Induction Type: IV induction Comments: POM

## 2023-04-20 NOTE — Anesthesia Preprocedure Evaluation (Addendum)
Anesthesia Evaluation  Patient identified by MRN, date of birth, ID band Patient awake    Reviewed: Allergy & Precautions, NPO status , Patient's Chart, lab work & pertinent test results  History of Anesthesia Complications (+) PONV and history of anesthetic complications  Airway Mallampati: IV   Neck ROM: Full    Dental no notable dental hx.    Pulmonary asthma (childhood) , sleep apnea    Pulmonary exam normal breath sounds clear to auscultation       Cardiovascular Exercise Tolerance: Good negative cardio ROS Normal cardiovascular exam Rhythm:Regular Rate:Normal     Neuro/Psych  PSYCHIATRIC DISORDERS Anxiety     negative neurological ROS     GI/Hepatic ,GERD  ,,  Endo/Other  Class 3 obesity   Renal/GU negative Renal ROS     Musculoskeletal   Abdominal   Peds  Hematology negative hematology ROS (+)   Anesthesia Other Findings Cardiology note 05/09/22:  42 y.o. male with  1 Palpitation  2. Intermittent chest pain  3. Anxiety, generalized  4. Obesity, Class III, BMI 40-49.9 (morbid obesity) (CMS-HCC)  5. OSA (obstructive sleep apnea)   Plan   Palpitations likely secondary to sinus arrhythmia, resolved. Pt denies any recent symptoms. Continue diltiazem and metoprolol tartrate. Recommend avoiding stimulants such as caffeine.  Intermittent chest pain, acute, associated with episodes of palpitations, no recent symptoms. Continue current medical therapy. Anxiety, chronic, fairly stable. Anxiety is likely contributing factors to underlying palpitations. He was recently seen by psychiatry and started on sertraline therapy; however, he has yet to start this medication. Management per PCP and psychiatry. Obesity class III, recommend weight loss, portion control, and exercise.  OSA, chronic. Poorly controlled. OSA is also likely contributing factor to the patient's intermittent episodes of arrhythmias as the patient  has been unable to tolerate CPAP. Recommend following up with pulmonary and PCP to explore other options for OSA management. Have patient follow up in 6 months  Return in about 6 months (around 11/08/2022).    Reproductive/Obstetrics                             Anesthesia Physical Anesthesia Plan  ASA: 3  Anesthesia Plan: General   Post-op Pain Management:    Induction: Intravenous  PONV Risk Score and Plan: 2 and Propofol infusion, TIVA and Treatment may vary due to age or medical condition  Airway Management Planned: Natural Airway  Additional Equipment:   Intra-op Plan:   Post-operative Plan:   Informed Consent: I have reviewed the patients History and Physical, chart, labs and discussed the procedure including the risks, benefits and alternatives for the proposed anesthesia with the patient or authorized representative who has indicated his/her understanding and acceptance.       Plan Discussed with: CRNA  Anesthesia Plan Comments: (LMA/GETA backup discussed.  Patient consented for risks of anesthesia including but not limited to:  - adverse reactions to medications - damage to eyes, teeth, lips or other oral mucosa - nerve damage due to positioning  - sore throat or hoarseness - damage to heart, brain, nerves, lungs, other parts of body or loss of life  Informed patient about role of CRNA in peri- and intra-operative care.  Patient voiced understanding.)        Anesthesia Quick Evaluation

## 2023-04-21 ENCOUNTER — Telehealth: Payer: Self-pay

## 2023-04-21 ENCOUNTER — Encounter: Payer: Self-pay | Admitting: Gastroenterology

## 2023-04-21 NOTE — Telephone Encounter (Signed)
capsule for low iron anemia

## 2023-05-09 NOTE — Telephone Encounter (Signed)
Left message on voicemail.

## 2023-06-09 NOTE — Telephone Encounter (Signed)
Unable to contact letter mailed.

## 2023-07-26 DIAGNOSIS — F419 Anxiety disorder, unspecified: Secondary | ICD-10-CM | POA: Diagnosis not present

## 2023-07-26 DIAGNOSIS — R002 Palpitations: Secondary | ICD-10-CM | POA: Diagnosis not present

## 2023-07-26 DIAGNOSIS — G4733 Obstructive sleep apnea (adult) (pediatric): Secondary | ICD-10-CM | POA: Diagnosis not present

## 2023-07-26 DIAGNOSIS — E669 Obesity, unspecified: Secondary | ICD-10-CM | POA: Diagnosis not present

## 2023-10-09 ENCOUNTER — Telehealth: Payer: Self-pay | Admitting: Internal Medicine

## 2023-10-09 ENCOUNTER — Ambulatory Visit: Payer: Self-pay | Admitting: Internal Medicine

## 2023-10-09 NOTE — Telephone Encounter (Signed)
 Okay---I will check him at tomorrow mornings visit

## 2023-10-09 NOTE — Telephone Encounter (Signed)
 FYI: This call has been transferred to Access Nurse. Once the result note has been entered staff can address the message at that time.  Patient called in with the following symptoms:  Red Word:dizziness  Lightheadedness, he has been dealing with this for about 1 week now.   Please advise at [facility] [phone]  Message is routed to Provider Pool and [facility] Triage

## 2023-10-09 NOTE — Telephone Encounter (Signed)
 Please see 10/09/23 nse triage note; Dr Alphonsus Sias has already responded.

## 2023-10-09 NOTE — Telephone Encounter (Signed)
    Chief Complaint: Dizziness with walking, worse in the morning.Eating and drinking well. Symptoms: Above Frequency: Last week.  Pertinent Negatives: Patient denies vertigo Disposition: [] ED /[] Urgent Care (no appt availability in office) / [x] Appointment(In office/virtual)/ []  [facility]/ [] Home Care/ [] Refused Recommended Disposition /[] [facility]/ []  Follow-up with PCP Additional Notes: Pt. Has appointment for tomorrow, Will call back for worsening of symptoms.  Reason for Disposition  [1] MODERATE dizziness (e.g., interferes with normal activities) AND [2] has NOT been evaluated by doctor (or NP/PA) for this  (Exception: Dizziness caused by heat exposure, sudden standing, or poor fluid intake.)  Answer Assessment - Initial Assessment Questions 1. DESCRIPTION: "Describe your dizziness."     Dizzy 2. LIGHTHEADED: "Do you feel lightheaded?" (e.g., somewhat faint, woozy, weak upon standing)     Woozy 3. VERTIGO: "Do you feel like either you or the room is spinning or tilting?" (i.e. vertigo)     No 4. SEVERITY: "How bad is it?"  "Do you feel like you are going to faint?" "Can you stand and walk?"   - MILD: Feels slightly dizzy, but walking normally.   - MODERATE: Feels unsteady when walking, but not falling; interferes with normal activities (e.g., school, work).   - SEVERE: Unable to walk without falling, or requires assistance to walk without falling; feels like passing out now.      Mild 5. ONSET:  "When did the dizziness begin?"     Last week 6. AGGRAVATING FACTORS: "Does anything make it worse?" (e.g., standing, change in head position)     Walking 7. HEART RATE: "Can you tell me your heart rate?" "How many beats in 15 seconds?"  (Note: not all patients can do this)       No 8. CAUSE: "What do you think is causing the dizziness?"     Unsure 9. RECURRENT SYMPTOM: "Have you had dizziness before?" If Yes, ask: "When was the last time?" "What happened that  time?"     Yes 10. OTHER SYMPTOMS: "Do you have any other symptoms?" (e.g., fever, chest pain, vomiting, diarrhea, bleeding)       Runny nose 11. PREGNANCY: "Is there any chance you are pregnant?" "When was your last menstrual period?"       N/a  Protocols used: Dizziness - Lightheadedness-A-AH

## 2023-10-10 ENCOUNTER — Encounter: Payer: Self-pay | Admitting: Internal Medicine

## 2023-10-10 ENCOUNTER — Ambulatory Visit: Admitting: Internal Medicine

## 2023-10-10 VITALS — BP 144/94 | HR 102 | Temp 97.7°F | Ht 74.0 in | Wt 319.0 lb

## 2023-10-10 DIAGNOSIS — R109 Unspecified abdominal pain: Secondary | ICD-10-CM | POA: Diagnosis not present

## 2023-10-10 DIAGNOSIS — K2100 Gastro-esophageal reflux disease with esophagitis, without bleeding: Secondary | ICD-10-CM | POA: Diagnosis not present

## 2023-10-10 DIAGNOSIS — F411 Generalized anxiety disorder: Secondary | ICD-10-CM | POA: Diagnosis not present

## 2023-10-10 DIAGNOSIS — D649 Anemia, unspecified: Secondary | ICD-10-CM

## 2023-10-10 LAB — RENAL FUNCTION PANEL
Albumin: 4.2 g/dL (ref 3.5–5.2)
BUN: 9 mg/dL (ref 6–23)
CO2: 28 meq/L (ref 19–32)
Calcium: 9.1 mg/dL (ref 8.4–10.5)
Chloride: 98 meq/L (ref 96–112)
Creatinine, Ser: 0.89 mg/dL (ref 0.40–1.50)
GFR: 105.92 mL/min (ref >60.00)
Glucose, Bld: 92 mg/dL (ref 70–99)
Phosphorus: 2 mg/dL — ABNORMAL LOW (ref 2.3–4.6)
Potassium: 4.3 meq/L (ref 3.5–5.1)
Sodium: 133 meq/L — ABNORMAL LOW (ref 135–145)

## 2023-10-10 LAB — HEPATIC FUNCTION PANEL
ALT: 82 U/L — ABNORMAL HIGH (ref 0–53)
AST: 76 U/L — ABNORMAL HIGH (ref 0–37)
Albumin: 4.2 g/dL (ref 3.5–5.2)
Alkaline Phosphatase: 70 U/L (ref 39–117)
Bilirubin, Direct: 0.2 mg/dL (ref 0.0–0.3)
Total Bilirubin: 0.6 mg/dL (ref 0.2–1.2)
Total Protein: 7.9 g/dL (ref 6.0–8.3)

## 2023-10-10 LAB — CBC
HCT: 34.2 % — ABNORMAL LOW (ref 39.0–52.0)
Hemoglobin: 10.9 g/dL — ABNORMAL LOW (ref 13.0–17.0)
MCHC: 31.8 g/dL (ref 30.0–36.0)
MCV: 79.2 fl (ref 78.0–100.0)
Platelets: 256 10*3/uL (ref 150.0–400.0)
RBC: 4.32 Mil/uL (ref 4.22–5.81)
RDW: 17.4 % — ABNORMAL HIGH (ref 11.5–15.5)
WBC: 6.3 10*3/uL (ref 4.0–10.5)

## 2023-10-10 LAB — IBC + FERRITIN
Ferritin: 14.9 ng/mL — ABNORMAL LOW (ref 22.0–322.0)
Iron: 51 ug/dL (ref 42–165)
Saturation Ratios: 9.6 % — ABNORMAL LOW (ref 20.0–50.0)
TIBC: 529.2 ug/dL — ABNORMAL HIGH (ref 250.0–450.0)
Transferrin: 378 mg/dL — ABNORMAL HIGH (ref 212.0–360.0)

## 2023-10-10 MED ORDER — SERTRALINE HCL 25 MG PO TABS: 25.0000 mg | ORAL_TABLET | Freq: Every day | ORAL | 1 refills | Status: DC

## 2023-10-10 MED ORDER — OMEPRAZOLE 20 MG PO CPDR: 20.0000 mg | DELAYED_RELEASE_CAPSULE | Freq: Two times a day (BID) | ORAL | 3 refills | Status: DC

## 2023-10-10 NOTE — Progress Notes (Signed)
 Subjective:    Patient ID: William Stuart, male    DOB: [birth year], 43 y.o.   MRN: [id number]  HPI Here with several concerns  Hasn't been feeling well over the past month or so Some lightheadedness--associated with trouble getting deep breaths (?anxiety but not sure) After being out with kids and lunch--vomited once Has cough and feels ready to vomit at times Last night--went out to dinner ([employer]) and felt the nausea (had meatloaf) All this has impacted him at work Cough is not persistent--very intermittent. No fever  Taking the omeprazole  Recent eye exam---they took BP after glaucoma test (he has trouble with that) 200 systolic that then came down to 120  Feels that his anxiety is worse Ready to consider Rx Has tried prozac and zoloft in the past--no known reactions just didn't stick with it  Still drinks regularly (2-3 beers daily) Does sometimes drink for his anxiety--is scaling back  Current Outpatient Medications on File Prior to Visit  Medication Sig Dispense Refill   metoprolol succinate (TOPROL-XL) 25 MG 24 hr tablet Take 25 mg by mouth daily.     omeprazole (PRILOSEC) 20 MG capsule TAKE 1 CAPSULE(20 MG) BY MOUTH DAILY 90 capsule 3   No current facility-administered medications on file prior to visit.    No Known Allergies  Past Medical History:  Diagnosis Date   Allergy    Anxiety    Asthma    Dysrhythmia    GERD (gastroesophageal reflux disease)     Past Surgical History:  Procedure Laterality Date   COLONOSCOPY WITH PROPOFOL N/A 04/20/2023   Procedure: COLONOSCOPY WITH PROPOFOL;  Surgeon: Midge Minium, MD;  Location: [facility] ENDOSCOPY;  Service: Endoscopy;  Laterality: N/A;   ESOPHAGOGASTRODUODENOSCOPY (EGD) WITH PROPOFOL N/A 04/20/2023   Procedure: ESOPHAGOGASTRODUODENOSCOPY (EGD) WITH PROPOFOL;  Surgeon: Midge Minium, MD;  Location: [facility] ENDOSCOPY;  Service: Endoscopy;  Laterality: N/A;   ORCHIECTOMY     L orchiectomy and R orchiopexy for  torsion   2002   PILONIDAL CYST EXCISION  2000 & 2001   3 times    Family History  Problem Relation Age of Onset   Anxiety disorder Mother    Migraines Mother    Celiac disease Mother    Crohn's disease Mother    Anxiety disorder Father    Migraines Father    Celiac disease Father    Anxiety disorder Sister    Alcohol abuse Paternal Grandfather    Cancer Neg Hx    Colon cancer Neg Hx    Esophageal cancer Neg Hx    Pancreatic cancer Neg Hx    Rectal cancer Neg Hx    Stomach cancer Neg Hx     Social History   Socioeconomic History   Marital status: Married    Spouse name: alyssa   Number of children: 2   Years of education: Not on file   Highest education level: Master's degree (e.g., MA, MS, MEng, MEd, MSW, MBA)  Occupational History   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    Employer: [employer]  Tobacco Use   Smoking status: Never    Passive exposure: Never   Smokeless tobacco: Never  Vaping Use   Vaping status: Never Used  Substance and Sexual Activity   Alcohol use: Yes    Alcohol/week: 25.0 standard drinks of alcohol    Types: 21 Cans of beer, 4 Shots of liquor per week    Comment: occasional   Drug use: No   Sexual  activity: Yes  Other Topics Concern   Not on file  Social History Narrative   Not on file   Social Drivers of Health   Financial Resource Strain: Low Risk  (10/08/2023)   Overall Financial Resource Strain (CARDIA)    Difficulty of Paying Living Expenses: Not very hard  Food Insecurity: No Food Insecurity (10/08/2023)   Hunger Vital Sign    Worried About Running Out of Food in the Last Year: Never true    Ran Out of Food in the Last Year: Never true  Transportation Needs: No Transportation Needs (10/08/2023)   PRAPARE - Administrator, Civil Service (Medical): No    Lack of Transportation (Non-Medical): No  Physical Activity: Insufficiently Active (10/08/2023)   Exercise Vital Sign    Days of Exercise  per Week: 3 days    Minutes of Exercise per Session: 20 min  Stress: Stress Concern Present (10/08/2023)   [employer] of Occupational Health - Occupational Stress Questionnaire    Feeling of Stress : Very much  Social Connections: Moderately Integrated (10/08/2023)   Social Connection and Isolation Panel [NHANES]    Frequency of Communication with Friends and Family: More than three times a week    Frequency of Social Gatherings with Friends and Family: Twice a week    Attends Religious Services: Never    Database administrator or Organizations: Yes    Attends Engineer, structural: More than 4 times per year    Marital Status: Married  Catering manager Violence: Not on file   Review of Systems Had colon/EGD in the fall--looked normal Initiates sleep fine---but does have trouble getting back to sleep. Gets some palpitations/anxiety at night     Objective:   Physical Exam Constitutional:      Appearance: Normal appearance.  Cardiovascular:     Rate and Rhythm: Normal rate and regular rhythm.     Heart sounds: No murmur heard.    No gallop.  Pulmonary:     Effort: Pulmonary effort is normal.     Breath sounds: Normal breath sounds. No wheezing or rales.  Abdominal:     General: There is no distension.     Palpations: Abdomen is soft.     Tenderness: There is no abdominal tenderness. There is no guarding or rebound.  Musculoskeletal:     Cervical back: Neck supple.     Right lower leg: No edema.     Left lower leg: No edema.  Lymphadenopathy:     Cervical: No cervical adenopathy.  Neurological:     Mental Status: He is alert.            Assessment & Plan:

## 2023-10-10 NOTE — Assessment & Plan Note (Signed)
 GI work up negative Will recheck

## 2023-10-10 NOTE — Assessment & Plan Note (Signed)
 Clearly seems to have more symptoms Will increase omeprazole to 20 bid

## 2023-10-10 NOTE — Assessment & Plan Note (Signed)
 Ready to try medication again Will start sertraline 25mg  for 1 week--then increase to 50mg 

## 2023-10-10 NOTE — Assessment & Plan Note (Signed)
 Vague symptoms and post prandial vomiting once Will check alpha gal--just in case ?GERD related

## 2023-10-11 ENCOUNTER — Encounter: Payer: Self-pay | Admitting: Internal Medicine

## 2023-10-13 LAB — ALPHA-GAL PANEL
Allergen, Mutton, f88: 0.26 kU/L — ABNORMAL HIGH
Allergen, Pork, f26: 0.15 kU/L — ABNORMAL HIGH
Beef: 0.42 kU/L — ABNORMAL HIGH
CLASS: 1
GALACTOSE-ALPHA-1,3-GALACTOSE IGE*: <0.1 kU/L (ref <0.10)

## 2023-10-13 LAB — INTERPRETATION:

## 2023-10-25 ENCOUNTER — Other Ambulatory Visit (HOSPITAL_COMMUNITY): Payer: Self-pay

## 2023-10-25 ENCOUNTER — Telehealth: Payer: Self-pay

## 2023-10-25 NOTE — Telephone Encounter (Signed)
 Pharmacy Patient Advocate Encounter   Received notification from Onbase that prior authorization for OMEPRAZOLE 20MG  is required/requested.   Insurance verification completed.   The patient is insured through  [employer]  .   Per test claim:  OMEPRAZOLE 40MG  is preferred by the insurance.  If suggested medication is appropriate, Please send in a new RX and discontinue this one. If not, please advise as to why it's not appropriate so that we may request a Prior Authorization. Please note, some preferred medications may still require a PA.  If the suggested medications have not been trialed and there are no contraindications to their use, the PA will not be submitted, as it will not be approved.

## 2023-10-26 ENCOUNTER — Telehealth: Payer: Self-pay

## 2023-10-26 NOTE — Telephone Encounter (Signed)
 William Minium, MD to Me     10/12/23  9:39 AM  The colonoscopy report suggested this patient have a capsule endoscopy but it does not appear it has been set up.  Please set this patient up for capsule study for Iron Deficient Anemia.   Msg sent via Mychart

## 2023-10-26 NOTE — Telephone Encounter (Signed)
-----   Message from [facility] D sent at 10/13/2023 10:45 AM EST ----- capsule

## 2023-11-14 ENCOUNTER — Ambulatory Visit: Admitting: Internal Medicine

## 2023-11-20 ENCOUNTER — Other Ambulatory Visit: Payer: Self-pay

## 2023-11-20 ENCOUNTER — Ambulatory Visit (INDEPENDENT_AMBULATORY_CARE_PROVIDER_SITE_OTHER): Payer: Self-pay | Admitting: Adult Health

## 2023-11-20 ENCOUNTER — Encounter: Payer: Self-pay | Admitting: Adult Health

## 2023-11-20 VITALS — BP 131/95 | HR 97 | Temp 99.2°F | Ht 74.0 in | Wt 310.0 lb

## 2023-11-20 DIAGNOSIS — J22 Unspecified acute lower respiratory infection: Secondary | ICD-10-CM

## 2023-11-20 MED ORDER — PSEUDOEPH-BROMPHEN-DM 30-2-10 MG/5ML PO SYRP: 5.0000 mL | ORAL_SOLUTION | Freq: Every evening | ORAL | 0 refills | Status: DC | PRN

## 2023-11-20 MED ORDER — ALBUTEROL SULFATE HFA 108 (90 BASE) MCG/ACT IN AERS
INHALATION_SPRAY | RESPIRATORY_TRACT | 0 refills | Status: DC
Start: 2023-11-20 — End: 2023-12-26

## 2023-11-20 MED ORDER — AZITHROMYCIN 250 MG PO TABS
ORAL_TABLET | ORAL | 0 refills | Status: AC
Start: 2023-11-20 — End: 2023-11-25

## 2023-11-20 NOTE — Progress Notes (Signed)
 [facility] [address] [place], [place] [address]   Office Visit Note  Patient Name: William Stuart Date of Birth [id number]  Medical Record number [id number]  Date of Service: 11/20/2023  Chief Complaint  Patient presents with   Cough    Patient c/o wet cough x 1 month that has been getting progressively worse over time. Today he has been getting a scratching sensation in the back of his throat which seems to precede the cough. He saw his PCP for this during initial onset of symptoms but cough is still persisting. He notes that he can feel something move in his chest when he breathes. He also states he felt dizzy during the day yesterday which does not seem to be as bothersome today.      Cough   Pt is here for a sick visit. He reports a cough that has been present for over a month.  He thinks it became worse over the last week. He reports being able to feel some rattling in his chest at times.  He does have some cough, and felt dizzy from so much coughing yesterday.  He denies fever, chills.     Current Medication:  Outpatient Encounter Medications as of 11/20/2023  Medication Sig   albuterol (VENTOLIN HFA) 108 (90 Base) MCG/ACT inhaler Inhale 2 puffs three times daily for one week.  Then use 2 puffs every 4-6 hours as needed for cough   azithromycin (ZITHROMAX) 250 MG tablet Take 2 tablets on day 1, then 1 tablet daily on days 2 through 5   brompheniramine-pseudoephedrine-DM 30-2-10 MG/5ML syrup Take 5 mLs by mouth at bedtime as needed.   metoprolol succinate (TOPROL-XL) 25 MG 24 hr tablet Take 25 mg by mouth daily.   omeprazole (PRILOSEC) 20 MG capsule Take 1 capsule (20 mg total) by mouth 2 (two) times daily before a meal.   sertraline (ZOLOFT) 25 MG tablet Take 1 tablet (25 mg total) by mouth daily. For 1 week, then 2 tabs daily (Patient not taking: Reported on 11/20/2023)   No facility-administered encounter medications on file as of 11/20/2023.      Medical  History: Past Medical History:  Diagnosis Date   Allergy    Anxiety    Asthma    Dysrhythmia    GERD (gastroesophageal reflux disease)      Vital Signs: BP (!) 131/95   Pulse 97   Temp 99.2 F (37.3 C)   Ht 6\' 2"  (1.88 m)   Wt (!) 310 lb (140.6 kg)   SpO2 96%   BMI 39.80 kg/m    Review of Systems  Respiratory:  Positive for cough.     Physical Exam Pulmonary:     Effort: Pulmonary effort is normal.     Breath sounds: Rhonchi present.    Assessment/Plan: 1. Lower respiratory infection (Primary) Take meds as discussed. Follow up via MyChart messenger if symptoms fail to improve or may return to clinic as needed for worsening symptoms.   - albuterol (VENTOLIN HFA) 108 (90 Base) MCG/ACT inhaler; Inhale 2 puffs three times daily for one week.  Then use 2 puffs every 4-6 hours as needed for cough  Dispense: 8.5 g; Refill: 0 - azithromycin (ZITHROMAX) 250 MG tablet; Take 2 tablets on day 1, then 1 tablet daily on days 2 through 5  Dispense: 6 tablet; Refill: 0 - brompheniramine-pseudoephedrine-DM 30-2-10 MG/5ML syrup; Take 5 mLs by mouth at bedtime as needed.  Dispense: 120 mL; Refill: 0  General Counseling: William Stuart verbalizes understanding of the findings of todays visit and agrees with plan of treatment. I have discussed any further diagnostic evaluation that may be needed or ordered today. We also reviewed his medications today. he has been encouraged to call the office with any questions or concerns that should arise related to todays visit.   No orders of the defined types were placed in this encounter.   Meds ordered this encounter  Medications   albuterol (VENTOLIN HFA) 108 (90 Base) MCG/ACT inhaler    Sig: Inhale 2 puffs three times daily for one week.  Then use 2 puffs every 4-6 hours as needed for cough    Dispense:  8.5 g    Refill:  0   azithromycin (ZITHROMAX) 250 MG tablet    Sig: Take 2 tablets on day 1, then 1 tablet daily on days 2 through 5     Dispense:  6 tablet    Refill:  0   brompheniramine-pseudoephedrine-DM 30-2-10 MG/5ML syrup    Sig: Take 5 mLs by mouth at bedtime as needed.    Dispense:  120 mL    Refill:  0    Time spent:15 Minutes    Sheria Dills AGNP-C Nurse Practitioner

## 2023-12-12 ENCOUNTER — Ambulatory Visit: Admitting: Internal Medicine

## 2023-12-26 ENCOUNTER — Encounter: Payer: Self-pay | Admitting: Internal Medicine

## 2023-12-26 ENCOUNTER — Ambulatory Visit: Admitting: Internal Medicine

## 2023-12-26 VITALS — BP 124/70 | HR 89 | Temp 97.6°F | Ht 74.0 in | Wt 310.0 lb

## 2023-12-26 DIAGNOSIS — I872 Venous insufficiency (chronic) (peripheral): Secondary | ICD-10-CM | POA: Diagnosis not present

## 2023-12-26 NOTE — Progress Notes (Signed)
 Subjective:    Patient ID: William Stuart, male    DOB: [birth year], 43 y.o.   MRN: [id number]  HPI Here due to right foot swelling  Has been having right foot/ankle swelling--noticeable at the end of the day After taking off dress sock one day--it seemed to "blow up" Swelling gone in the AM 2-3 weeks or so  No dietary changes Only uses salt on eggs--and not lately No SOB  About 2 months ago, was playing kick ball and twisted the ankle some Hurt briefly--and gone within 2 days  Current Outpatient Medications on File Prior to Visit  Medication Sig Dispense Refill   metoprolol  succinate (TOPROL -XL) 25 MG 24 hr tablet Take 25 mg by mouth daily.     omeprazole  (PRILOSEC) 20 MG capsule Take 1 capsule (20 mg total) by mouth 2 (two) times daily before a meal. 180 capsule 3   sertraline  (ZOLOFT ) 25 MG tablet Take 1 tablet (25 mg total) by mouth daily. For 1 week, then 2 tabs daily 60 tablet 1   No current facility-administered medications on file prior to visit.    No Known Allergies  Past Medical History:  Diagnosis Date   Allergy    Anxiety    Asthma    Dysrhythmia    GERD (gastroesophageal reflux disease)     Past Surgical History:  Procedure Laterality Date   COLONOSCOPY WITH PROPOFOL  N/A 04/20/2023   Procedure: COLONOSCOPY WITH PROPOFOL ;  Surgeon: Marnee Sink, MD;  Location: [facility] ENDOSCOPY;  Service: Endoscopy;  Laterality: N/A;   ESOPHAGOGASTRODUODENOSCOPY (EGD) WITH PROPOFOL  N/A 04/20/2023   Procedure: ESOPHAGOGASTRODUODENOSCOPY (EGD) WITH PROPOFOL ;  Surgeon: Marnee Sink, MD;  Location: [facility] ENDOSCOPY;  Service: Endoscopy;  Laterality: N/A;   ORCHIECTOMY     L orchiectomy and R orchiopexy for torsion   2002   PILONIDAL CYST EXCISION  2000 & 2001   3 times    Family History  Problem Relation Age of Onset   Anxiety disorder Mother    Migraines Mother    Celiac disease Mother    Crohn's disease Mother    Anxiety disorder Father    Migraines Father    Celiac  disease Father    Anxiety disorder Sister    Alcohol abuse Paternal Grandfather    Cancer Neg Hx    Colon cancer Neg Hx    Esophageal cancer Neg Hx    Pancreatic cancer Neg Hx    Rectal cancer Neg Hx    Stomach cancer Neg Hx     Social History   Socioeconomic History   Marital status: Married    Spouse name: alyssa   Number of children: 2   Years of education: Not on file   Highest education level: Master's degree (e.g., MA, MS, MEng, MEd, MSW, MBA)  Occupational History   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    Employer: [employer]  Tobacco Use   Smoking status: Never    Passive exposure: Never   Smokeless tobacco: Never  Vaping Use   Vaping status: Never Used  Substance and Sexual Activity   Alcohol use: Yes    Alcohol/week: 25.0 standard drinks of alcohol    Types: 21 Cans of beer, 4 Shots of liquor per week    Comment: occasional   Drug use: No   Sexual activity: Yes  Other Topics Concern   Not on file  Social History Narrative   Not on file   Social Drivers of Health   Financial Resource Strain:  Low Risk  (10/08/2023)   Overall Financial Resource Strain (CARDIA)    Difficulty of Paying Living Expenses: Not very hard  Food Insecurity: No Food Insecurity (10/08/2023)   Hunger Vital Sign    Worried About Running Out of Food in the Last Year: Never true    Ran Out of Food in the Last Year: Never true  Transportation Needs: No Transportation Needs (10/08/2023)   PRAPARE - Administrator, Civil Service (Medical): No    Lack of Transportation (Non-Medical): No  Physical Activity: Insufficiently Active (10/08/2023)   Exercise Vital Sign    Days of Exercise per Week: 3 days    Minutes of Exercise per Session: 20 min  Stress: Stress Concern Present (10/08/2023)   [employer] of Occupational Health - Occupational Stress Questionnaire    Feeling of Stress : Very much  Social Connections: Moderately Integrated (10/08/2023)    Social Connection and Isolation Panel [NHANES]    Frequency of Communication with Friends and Family: More than three times a week    Frequency of Social Gatherings with Friends and Family: Twice a week    Attends Religious Services: Never    Database administrator or Organizations: Yes    Attends Engineer, structural: More than 4 times per year    Marital Status: Married  Catering manager Violence: Not on file   Review of Systems No recent travel No leg injuries    Objective:   Physical Exam Constitutional:      Appearance: Normal appearance.  Cardiovascular:     Rate and Rhythm: Normal rate and regular rhythm.     Heart sounds: No murmur heard.    No gallop.  Pulmonary:     Effort: Pulmonary effort is normal.     Breath sounds: Normal breath sounds. No wheezing or rales.  Abdominal:     Palpations: Abdomen is soft.     Tenderness: There is no abdominal tenderness.     Comments: No HSM  Musculoskeletal:     Cervical back: Neck supple.     Comments: Fullness in feet/ankles--is symmetric No calf swelling or tenderness  Lymphadenopathy:     Cervical: No cervical adenopathy.  Neurological:     Mental Status: He is alert.            Assessment & Plan:

## 2023-12-26 NOTE — Assessment & Plan Note (Signed)
 Mild and early Nothing to suggest systemic disease Discussed avoiding salt No action absolutely needed since mild and no pain--but it makes sense to start with support socks

## 2024-01-11 ENCOUNTER — Ambulatory Visit (INDEPENDENT_AMBULATORY_CARE_PROVIDER_SITE_OTHER): Payer: Self-pay | Admitting: Adult Health

## 2024-01-11 ENCOUNTER — Other Ambulatory Visit: Payer: Self-pay

## 2024-01-11 ENCOUNTER — Encounter: Payer: Self-pay | Admitting: Adult Health

## 2024-01-11 VITALS — BP 137/86 | HR 76 | Temp 97.2°F | Ht 74.0 in | Wt 300.0 lb

## 2024-01-11 DIAGNOSIS — R42 Dizziness and giddiness: Secondary | ICD-10-CM

## 2024-01-11 NOTE — Progress Notes (Signed)
 [facility] [address] [place], [place] [address]   Office Visit Note  Patient Name: William Stuart Date of Birth [id number]  Medical Record number [id number]  Date of Service: 01/11/2024  Chief Complaint  Patient presents with   Dizziness    Patient c/o intermittent dizziness x 10 days. He states he has occasional sinus pain with it. Denies nausea/vomiting and ear pain. He has a F/U appt with his cardiologist, Dr. Beau Stuart, next week.      HPI Pt is here reporting about 10 days of feeling dizzy.  He had had occasional dizziness, but it seems to have been worse over the last week. He describes some sinus pressure/headache with it at times.  Denies any fever, chills.  He states that it feels like his blood sugar might be low. He sees cardiology, and takes metoprolol .  He has an appt with cardiology next week.     Current Medication:  Outpatient Encounter Medications as of 01/11/2024  Medication Sig   metoprolol  succinate (TOPROL -XL) 25 MG 24 hr tablet Take 25 mg by mouth daily.   omeprazole  (PRILOSEC) 20 MG capsule Take 1 capsule (20 mg total) by mouth 2 (two) times daily before a meal.   [DISCONTINUED] sertraline  (ZOLOFT ) 25 MG tablet Take 1 tablet (25 mg total) by mouth daily. For 1 week, then 2 tabs daily   No facility-administered encounter medications on file as of 01/11/2024.      Medical History: Past Medical History:  Diagnosis Date   Allergy    Anxiety    Asthma    Dysrhythmia    GERD (gastroesophageal reflux disease)      Vital Signs: BP 137/86   Pulse 76   Temp (!) 97.2 F (36.2 C)   Ht 6\' 2"  (1.88 m)   Wt 300 lb (136.1 kg)   SpO2 99%   BMI 38.52 kg/m    Review of Systems  Constitutional:  Positive for fatigue. Negative for chills and fever.  HENT:  Positive for sinus pressure. Negative for congestion and sore throat.   Eyes:  Negative for pain and itching.  Respiratory:  Negative for cough.   Cardiovascular:  Negative for chest pain,  palpitations and leg swelling.  Neurological:  Positive for dizziness and light-headedness. Negative for tremors, syncope, facial asymmetry, speech difficulty, weakness, numbness and headaches.    Physical Exam Vitals reviewed.  Constitutional:      Appearance: Normal appearance.  HENT:     Head: Normocephalic.     Right Ear: Tympanic membrane and ear canal normal.     Left Ear: Tympanic membrane and ear canal normal.     Nose: Nose normal.     Mouth/Throat:     Mouth: Mucous membranes are moist.  Eyes:     Pupils: Pupils are equal, round, and reactive to light.  Cardiovascular:     Rate and Rhythm: Normal rate.     Pulses: Normal pulses.     Heart sounds: Normal heart sounds.  Pulmonary:     Effort: Pulmonary effort is normal.     Breath sounds: Normal breath sounds.  Lymphadenopathy:     Cervical: No cervical adenopathy.  Neurological:     Mental Status: He is alert.    Assessment/Plan: 1. Light-headed feeling (Primary) Discussed doing labs. He will see cardiologist as scheduled next week. Discussed next steps if cardiology work up is negative.      General Counseling: William Stuart verbalizes understanding of the findings of todays visit  and agrees with plan of treatment. I have discussed any further diagnostic evaluation that may be needed or ordered today. We also reviewed his medications today. he has been encouraged to call the office with any questions or concerns that should arise related to todays visit.   No orders of the defined types were placed in this encounter.   No orders of the defined types were placed in this encounter.   Time spent:15 Minutes    Sheria Dills AGNP-C Nurse Practitioner

## 2024-01-16 DIAGNOSIS — G4733 Obstructive sleep apnea (adult) (pediatric): Secondary | ICD-10-CM | POA: Diagnosis not present

## 2024-01-16 DIAGNOSIS — R0789 Other chest pain: Secondary | ICD-10-CM | POA: Diagnosis not present

## 2024-01-16 DIAGNOSIS — I5022 Chronic systolic (congestive) heart failure: Secondary | ICD-10-CM | POA: Diagnosis not present

## 2024-01-16 DIAGNOSIS — R002 Palpitations: Secondary | ICD-10-CM | POA: Diagnosis not present

## 2024-01-17 ENCOUNTER — Other Ambulatory Visit: Payer: Self-pay | Admitting: Nurse Practitioner

## 2024-01-17 DIAGNOSIS — R600 Localized edema: Secondary | ICD-10-CM

## 2024-01-18 ENCOUNTER — Encounter: Payer: Self-pay | Admitting: Adult Health

## 2024-01-18 ENCOUNTER — Ambulatory Visit
Admission: RE | Admit: 2024-01-18 | Discharge: 2024-01-18 | Disposition: A | Discharge status: Home / Self Care | Source: Ambulatory Visit | Attending: Nurse Practitioner | Admitting: Nurse Practitioner

## 2024-01-18 DIAGNOSIS — E871 Hypo-osmolality and hyponatremia: Secondary | ICD-10-CM | POA: Diagnosis not present

## 2024-01-18 DIAGNOSIS — R600 Localized edema: Secondary | ICD-10-CM | POA: Diagnosis not present

## 2024-01-18 DIAGNOSIS — R42 Dizziness and giddiness: Secondary | ICD-10-CM | POA: Diagnosis not present

## 2024-01-18 DIAGNOSIS — D509 Iron deficiency anemia, unspecified: Secondary | ICD-10-CM | POA: Diagnosis not present

## 2024-01-19 ENCOUNTER — Other Ambulatory Visit: Payer: Self-pay

## 2024-01-19 ENCOUNTER — Ambulatory Visit (INDEPENDENT_AMBULATORY_CARE_PROVIDER_SITE_OTHER): Payer: Self-pay | Admitting: Adult Health

## 2024-01-19 VITALS — BP 121/79 | HR 87 | Temp 97.5°F | Wt 305.4 lb

## 2024-01-19 DIAGNOSIS — R5383 Other fatigue: Secondary | ICD-10-CM

## 2024-01-19 NOTE — Progress Notes (Signed)
 [facility] [address] [place], [place] [address]   Office Visit Note  Patient Name: William Stuart Date of Birth [id number]  Medical Record number [id number]  Date of Service: 01/19/2024  Chief Complaint  Patient presents with   Follow-up   Hand Pain    Pinky finger, cyst like issue     Hand Pain    Pt is here for follow up.  He is requesting a Hgba1c.  He does report a small hard nodule on the little finger of his left hand.    Current Medication:  Outpatient Encounter Medications as of 01/19/2024  Medication Sig   metoprolol  succinate (TOPROL -XL) 25 MG 24 hr tablet Take 25 mg by mouth daily.   omeprazole  (PRILOSEC) 20 MG capsule Take 1 capsule (20 mg total) by mouth 2 (two) times daily before a meal.   No facility-administered encounter medications on file as of 01/19/2024.      Medical History: Past Medical History:  Diagnosis Date   Allergy    Anxiety    Asthma    Dysrhythmia    GERD (gastroesophageal reflux disease)      Vital Signs: BP 121/79   Pulse 87   Temp (!) 97.5 F (36.4 C) (Tympanic)   Wt (!) 305 lb 6.4 oz (138.5 kg)   SpO2 99%   BMI 39.21 kg/m    Review of Systems  Constitutional:  Negative for chills, fatigue and fever.    Physical Exam Constitutional:      Appearance: Normal appearance.  HENT:     Head: Normocephalic.     Nose: Nose normal.     Mouth/Throat:     Mouth: Mucous membranes are moist.   Eyes:     Pupils: Pupils are equal, round, and reactive to light.    Neurological:     Mental Status: He is alert.    Assessment/Plan: 1. Other fatigue (Primary) Follow up when labs resulted - Hemoglobin A1c - B12 and Folate Panel - VITAMIN D  25 Hydroxy (Vit-D Deficiency, Fractures) - Uric acid     General Counseling: Naven verbalizes understanding of the findings of todays visit and agrees with plan of treatment. I have discussed any further diagnostic evaluation that may be needed or ordered today. We also  reviewed his medications today. he has been encouraged to call the office with any questions or concerns that should arise related to todays visit.   Orders Placed This Encounter  Procedures   Hemoglobin A1c   B12 and Folate Panel   VITAMIN D  25 Hydroxy (Vit-D Deficiency, Fractures)   Uric acid    No orders of the defined types were placed in this encounter.   Time spent:15 Minutes    Sheria Dills AGNP-C Nurse Practitioner

## 2024-01-21 LAB — B12 AND FOLATE PANEL
Folate: 13.8 ng/mL (ref >3.0)
Vitamin B-12: 419 pg/mL (ref 232–1245)

## 2024-01-21 LAB — URIC ACID: Uric Acid: 7.3 mg/dL (ref 3.8–8.4)

## 2024-01-21 LAB — HEMOGLOBIN A1C
Est. average glucose Bld gHb Est-mCnc: 105 mg/dL
Hgb A1c MFr Bld: 5.3 % (ref 4.8–5.6)

## 2024-01-21 LAB — VITAMIN D 25 HYDROXY (VIT D DEFICIENCY, FRACTURES): Vit D, 25-Hydroxy: 20.4 ng/mL — ABNORMAL LOW (ref 30.0–100.0)

## 2024-01-22 DIAGNOSIS — R42 Dizziness and giddiness: Secondary | ICD-10-CM | POA: Diagnosis not present

## 2024-01-24 ENCOUNTER — Ambulatory Visit: Payer: Self-pay | Admitting: Adult Health

## 2024-02-05 DIAGNOSIS — R42 Dizziness and giddiness: Secondary | ICD-10-CM | POA: Diagnosis not present

## 2024-02-26 DIAGNOSIS — R42 Dizziness and giddiness: Secondary | ICD-10-CM | POA: Diagnosis not present

## 2024-02-27 ENCOUNTER — Ambulatory Visit: Admitting: Family Medicine

## 2024-02-27 ENCOUNTER — Ambulatory Visit: Payer: Self-pay | Admitting: Family Medicine

## 2024-02-27 ENCOUNTER — Encounter: Payer: Self-pay | Admitting: Family Medicine

## 2024-02-27 VITALS — BP 116/80 | HR 58 | Temp 97.8°F | Ht 74.0 in | Wt 301.5 lb

## 2024-02-27 DIAGNOSIS — R7401 Elevation of levels of liver transaminase levels: Secondary | ICD-10-CM | POA: Insufficient documentation

## 2024-02-27 DIAGNOSIS — K2100 Gastro-esophageal reflux disease with esophagitis, without bleeding: Secondary | ICD-10-CM | POA: Diagnosis not present

## 2024-02-27 DIAGNOSIS — Z91014 Allergy to mammalian meats: Secondary | ICD-10-CM | POA: Insufficient documentation

## 2024-02-27 DIAGNOSIS — D509 Iron deficiency anemia, unspecified: Secondary | ICD-10-CM | POA: Diagnosis not present

## 2024-02-27 DIAGNOSIS — R109 Unspecified abdominal pain: Secondary | ICD-10-CM

## 2024-02-27 DIAGNOSIS — E669 Obesity, unspecified: Secondary | ICD-10-CM

## 2024-02-27 LAB — CBC WITH DIFFERENTIAL/PLATELET
Basophils Absolute: 0 K/uL (ref 0.0–0.1)
Basophils Relative: 0.9 % (ref 0.0–3.0)
Eosinophils Absolute: 0.1 K/uL (ref 0.0–0.7)
Eosinophils Relative: 1.1 % (ref 0.0–5.0)
HCT: 34.9 % — ABNORMAL LOW (ref 39.0–52.0)
Hemoglobin: 11 g/dL — ABNORMAL LOW (ref 13.0–17.0)
Lymphocytes Relative: 36.6 % (ref 12.0–46.0)
Lymphs Abs: 2 K/uL (ref 0.7–4.0)
MCHC: 31.4 g/dL (ref 30.0–36.0)
MCV: 78.1 fl (ref 78.0–100.0)
Monocytes Absolute: 0.8 K/uL (ref 0.1–1.0)
Monocytes Relative: 15.6 % — ABNORMAL HIGH (ref 3.0–12.0)
Neutro Abs: 2.5 K/uL (ref 1.4–7.7)
Neutrophils Relative %: 45.8 % (ref 43.0–77.0)
Platelets: 227 K/uL (ref 150.0–400.0)
RBC: 4.48 Mil/uL (ref 4.22–5.81)
RDW: 18.5 % — ABNORMAL HIGH (ref 11.5–15.5)
WBC: 5.4 K/uL (ref 4.0–10.5)

## 2024-02-27 LAB — FERRITIN: Ferritin: 14 ng/mL — ABNORMAL LOW (ref 22.0–322.0)

## 2024-02-27 LAB — BASIC METABOLIC PANEL WITH GFR
BUN: 10 mg/dL (ref 6–23)
CO2: 29 meq/L (ref 19–32)
Calcium: 9.1 mg/dL (ref 8.4–10.5)
Chloride: 101 meq/L (ref 96–112)
Creatinine, Ser: 0.97 mg/dL (ref 0.40–1.50)
GFR: 96.23 mL/min (ref >60.00)
Glucose, Bld: 92 mg/dL (ref 70–99)
Potassium: 4.3 meq/L (ref 3.5–5.1)
Sodium: 136 meq/L (ref 135–145)

## 2024-02-27 LAB — HEPATIC FUNCTION PANEL
ALT: 169 U/L — ABNORMAL HIGH (ref 0–53)
AST: 95 U/L — ABNORMAL HIGH (ref 0–37)
Albumin: 4.2 g/dL (ref 3.5–5.2)
Alkaline Phosphatase: 53 U/L (ref 39–117)
Bilirubin, Direct: 0.1 mg/dL (ref 0.0–0.3)
Total Bilirubin: 0.5 mg/dL (ref 0.2–1.2)
Total Protein: 7.2 g/dL (ref 6.0–8.3)

## 2024-02-27 LAB — IRON: Iron: 18 ug/dL — ABNORMAL LOW (ref 42–165)

## 2024-02-27 LAB — LIPASE: Lipase: 35 U/L (ref 11.0–59.0)

## 2024-02-27 NOTE — Patient Instructions (Addendum)
 Labs today   I put the referral in for ultrasound of abdomen  Please let us  know if you don't hear in 1-2 weeks to set that up  Schedule follow up with Dr Jimmy about a week after your ultrasound   Avoid fatty foods  Avoid beef and alpha gal trigger   Avoid alcohol for now   If symptoms worsen let us  know If severe go to the ER

## 2024-02-27 NOTE — Assessment & Plan Note (Signed)
 Encouraged pt to be better about avoiding dietary triggers / beef / mammalian meat

## 2024-02-27 NOTE — Assessment & Plan Note (Signed)
 In setting of RUQ pain/ malaise in pt with GERD and alpha gal allergy and obesity who drinks moderate etoh, also iron def anemia   He stopped all etoh this past weekend and no acetaminophen   Lab today  Us  of abd ordered (normal in 2020)

## 2024-02-27 NOTE — Progress Notes (Signed)
 Subjective:    Patient ID: William Stuart, male    DOB: 05/23/1981, 43 y.o.   MRN: [id number]  HPI  Wt Readings from Last 3 Encounters:  02/27/24 (!) 301 lb 8 oz (136.8 kg)  01/19/24 (!) 305 lb 6.4 oz (138.5 kg)  01/11/24 300 lb (136.1 kg)   38.71 kg/m  Vitals:   02/27/24 0759  BP: 116/80  Pulse: (!) 58  Temp: 97.8 F (36.6 C)  SpO2: 99%    43 yo pt of Dr Jimmy presents with c/o GI symptoms (acute on chronic) -worsened now  History of alpha gal allergy  History of GERD- takes omeprazole  20 mg bid (in good control)   Woke up on 7/13 feeling bad  Felt dehydrated -dry mouth/throat and skin (drank lots of water and gatorade and did not help)  Does not think this was heat related illness  Felt like heart rate was elevated  Just a little dizzy (light headed)  Dull pain in RUQ that some times move across the abdomen to the middle   Did have some diarrhea on the 14,15th -better now   No rash No fever     He may have eaten something that triggered his alpha gal    Feels better overall but not resolved   Does drink etoh  2-3 beers per night  No alcohol since Sunday   No acetaminophen    Today -abd does not feel too bad  Dull pain waxes and wanes  Unsure if there are any trigger   No n/v/d now   Colonoscopy 04/2023 normal Dr Jinny EGD noted hiatal hernia   Abd us  02/2019-normal   Lab Results  Component Value Date   NA 133 (L) 10/10/2023   K 4.3 10/10/2023   CO2 28 10/10/2023   GLUCOSE 92 10/10/2023   BUN 9 10/10/2023   CREATININE 0.89 10/10/2023   CALCIUM 9.1 10/10/2023   GFR 105.92 10/10/2023   EGFR 86 03/09/2023   GFRNONAA >60 09/06/2021   Lab Results  Component Value Date   ALT 82 (H) 10/10/2023   AST 76 (H) 10/10/2023   GGT 73 (H) 10/21/2020   ALKPHOS 70 10/10/2023   BILITOT 0.6 10/10/2023   Lab Results  Component Value Date   WBC 6.3 10/10/2023   HGB 10.9 (L) 10/10/2023   HCT 34.2 (L) 10/10/2023   MCV 79.2 10/10/2023   PLT 256.0  10/10/2023   Lab Results  Component Value Date   IRON 51 10/10/2023   TIBC 529.2 (H) 10/10/2023   FERRITIN 14.9 (L) 10/10/2023   Lab Results  Component Value Date   VITAMINB12 419 01/19/2024  Last vitamin D  Lab Results  Component Value Date   VD25OH 20.4 (L) 01/19/2024   Lab Results  Component Value Date   HGBA1C 5.3 01/19/2024   HGBA1C 5.5 06/24/2022   HGBA1C 5.3 05/14/2021   Did not find a cause of anemia or low iron   Was planning a small bowel study but then his GI practice went under   Has iron / not taking   Patient Active Problem List   Diagnosis Date Noted   Alpha-gal syndrome 02/27/2024   Elevated liver transaminase level 02/27/2024   Venous insufficiency 12/26/2023   Abdominal discomfort 10/10/2023   Anemia 04/20/2023   Iron deficiency anemia 03/22/2023   Insomnia 03/29/2022   Obesity (BMI 30-39.9) 05/14/2021   Fatigue 05/10/2019   Preventative health care 03/19/2019   GERD (gastroesophageal reflux disease) 02/14/2018   OSA (obstructive sleep  apnea) 07/24/2017   Chronic tension-type headache, not intractable 07/29/2016   Palpitations 04/24/2015   GAD (generalized anxiety disorder) 04/30/2007   Allergic rhinitis 02/27/2007   Allergic asthma 02/27/2007   Past Medical History:  Diagnosis Date   Allergy    Anxiety    Asthma    Dysrhythmia    GERD (gastroesophageal reflux disease)    Past Surgical History:  Procedure Laterality Date   COLONOSCOPY WITH PROPOFOL  N/A 04/20/2023   Procedure: COLONOSCOPY WITH PROPOFOL ;  Surgeon: Jinny Carmine, MD;  Location: [facility];  Service: Endoscopy;  Laterality: N/A;   ESOPHAGOGASTRODUODENOSCOPY (EGD) WITH PROPOFOL  N/A 04/20/2023   Procedure: ESOPHAGOGASTRODUODENOSCOPY (EGD) WITH PROPOFOL ;  Surgeon: Jinny Carmine, MD;  Location: [facility];  Service: Endoscopy;  Laterality: N/A;   ORCHIECTOMY     L orchiectomy and R orchiopexy for torsion   2002   PILONIDAL CYST EXCISION  2000 & 2001   3 times   Social  History   Tobacco Use   Smoking status: Never    Passive exposure: Never   Smokeless tobacco: Never  Vaping Use   Vaping status: Never Used  Substance Use Topics   Alcohol use: Yes    Alcohol/week: 25.0 standard drinks of alcohol    Types: 21 Cans of beer, 4 Shots of liquor per week    Comment: occasional   Drug use: No   Family History  Problem Relation Age of Onset   Anxiety disorder Mother    Migraines Mother    Celiac disease Mother    Crohn's disease Mother    Anxiety disorder Father    Migraines Father    Celiac disease Father    Anxiety disorder Sister    Alcohol abuse Paternal Grandfather    Cancer Neg Hx    Colon cancer Neg Hx    Esophageal cancer Neg Hx    Pancreatic cancer Neg Hx    Rectal cancer Neg Hx    Stomach cancer Neg Hx    No Known Allergies Current Outpatient Medications on File Prior to Visit  Medication Sig Dispense Refill   metoprolol  succinate (TOPROL -XL) 25 MG 24 hr tablet Take 25 mg by mouth daily.     omeprazole  (PRILOSEC) 20 MG capsule Take 1 capsule (20 mg total) by mouth 2 (two) times daily before a meal. 180 capsule 3   No current facility-administered medications on file prior to visit.    Review of Systems  Constitutional:  Positive for fatigue. Negative for activity change, appetite change, fever and unexpected weight change.  HENT:  Negative for congestion, rhinorrhea, sore throat and trouble swallowing.        Dry mouth Improved now   Eyes:  Negative for pain, redness, itching and visual disturbance.  Respiratory:  Negative for cough, chest tightness, shortness of breath and wheezing.   Cardiovascular:  Negative for chest pain and palpitations.  Gastrointestinal:  Positive for abdominal pain. Negative for abdominal distention, anal bleeding, blood in stool, constipation, diarrhea, nausea, rectal pain and vomiting.  Endocrine: Negative for cold intolerance, heat intolerance, polydipsia and polyuria.  Genitourinary:  Negative for  difficulty urinating, dysuria, frequency and urgency.  Musculoskeletal:  Negative for arthralgias, joint swelling and myalgias.  Skin:  Negative for pallor and rash.  Neurological:  Positive for light-headedness. Negative for dizziness, tremors, weakness, numbness and headaches.       Light headed feeling is improved   Hematological:  Negative for adenopathy. Does not bruise/bleed easily.  Psychiatric/Behavioral:  Negative for decreased concentration and  dysphoric mood. The patient is not nervous/anxious.        Objective:   Physical Exam Constitutional:      General: He is not in acute distress.    Appearance: Normal appearance. He is well-developed. He is obese. He is not ill-appearing or diaphoretic.  HENT:     Head: Normocephalic and atraumatic.  Eyes:     General: No scleral icterus.    Conjunctiva/sclera: Conjunctivae normal.     Pupils: Pupils are equal, round, and reactive to light.  Neck:     Thyroid : No thyromegaly.     Vascular: No carotid bruit or JVD.  Cardiovascular:     Rate and Rhythm: Normal rate and regular rhythm.     Heart sounds: Normal heart sounds.     No gallop.  Pulmonary:     Effort: Pulmonary effort is normal. No respiratory distress.     Breath sounds: Normal breath sounds. No wheezing or rales.  Abdominal:     General: Abdomen is protuberant. Bowel sounds are normal. There is no distension or abdominal bruit.     Palpations: Abdomen is soft. There is no shifting dullness, fluid wave, hepatomegaly, splenomegaly, mass or pulsatile mass.     Tenderness: There is no abdominal tenderness.  Musculoskeletal:     Cervical back: Normal range of motion and neck supple.     Right lower leg: No edema.     Left lower leg: No edema.  Lymphadenopathy:     Cervical: No cervical adenopathy.  Skin:    General: Skin is warm and dry.     Coloration: Skin is not jaundiced or pale.     Findings: No bruising, erythema or rash.  Neurological:     Mental Status: He  is alert.     Coordination: Coordination normal.     Deep Tendon Reflexes: Reflexes are normal and symmetric. Reflexes normal.  Psychiatric:        Mood and Affect: Mood normal.           Assessment & Plan:   Problem List Items Addressed This Visit       Digestive   GERD (gastroesophageal reflux disease)   Per pt well controlled with omeprazole  20 mg bid  No heartburn but does have dull abd pain Encouraged to avoid triggers         Other   Obesity (BMI 30-39.9)   At risk for fatty liver       Iron deficiency anemia   Normal colonoscopy an EGD 04/2023 -reviewed  Lost his GI provider and will likely need to get re est with new office  Was considering small bowel study?   Has iron but not taking it       Relevant Orders   Iron   Ferritin   CBC with Differential/Platelet   Elevated liver transaminase level   In setting of RUQ pain/ malaise in pt with GERD and alpha gal allergy and obesity who drinks moderate etoh, also iron def anemia   He stopped all etoh this past weekend and no acetaminophen   Lab today  Us  of abd ordered (normal in 2020)       Relevant Orders   US  Abdomen Complete   Alpha-gal syndrome   Encouraged pt to be better about avoiding dietary triggers / beef / mammalian meat      Abdominal discomfort - Primary   RUQ that radiates to epigastric area  Sometimes more diffuse  In pt with alpha gal/ alcohol use/  iron def anemia and elevated LFTs   Reviewed last colonoscopy/egd and us   Reviewed last GI notes and pcp notes , labs   Reassuring exam today (symptoms improved after stopping all etoh this wknd)  Labs ordered Abd us   Instructed to follow up with pcp a wk after us   Encouraged to avoid fatty foods and mammalian meats for now as well as etoh   Call back and Er precautions noted in detail today        Relevant Orders   CBC with Differential/Platelet   Hepatic function panel   Basic metabolic panel with GFR   Lipase   US  Abdomen  Complete

## 2024-02-27 NOTE — Assessment & Plan Note (Addendum)
 RUQ that radiates to epigastric area  Sometimes more diffuse  In pt with alpha gal/ alcohol use/ iron def anemia and elevated LFTs   Reviewed last colonoscopy/egd and us   Reviewed last GI notes and pcp notes , labs   Reassuring exam today (symptoms improved after stopping all etoh this wknd)  Labs ordered Abd us   Instructed to follow up with pcp a wk after us   Encouraged to avoid fatty foods and mammalian meats for now as well as etoh   Call back and Er precautions noted in detail today

## 2024-02-27 NOTE — Assessment & Plan Note (Signed)
 Per pt well controlled with omeprazole  20 mg bid  No heartburn but does have dull abd pain Encouraged to avoid triggers

## 2024-02-27 NOTE — Assessment & Plan Note (Signed)
 Normal colonoscopy an EGD 04/2023 -reviewed  Lost his GI provider and will likely need to get re est with new office  Was considering small bowel study?   Has iron but not taking it

## 2024-02-27 NOTE — Assessment & Plan Note (Signed)
 At risk for fatty liver

## 2024-03-04 ENCOUNTER — Ambulatory Visit
Admission: RE | Admit: 2024-03-04 | Discharge: 2024-03-04 | Disposition: A | Discharge status: Home / Self Care | Source: Ambulatory Visit | Attending: Family Medicine | Admitting: Family Medicine

## 2024-03-04 DIAGNOSIS — R7401 Elevation of levels of liver transaminase levels: Secondary | ICD-10-CM | POA: Insufficient documentation

## 2024-03-04 DIAGNOSIS — R109 Unspecified abdominal pain: Secondary | ICD-10-CM | POA: Diagnosis not present

## 2024-03-04 DIAGNOSIS — K760 Fatty (change of) liver, not elsewhere classified: Secondary | ICD-10-CM | POA: Diagnosis not present

## 2024-03-04 DIAGNOSIS — R7989 Other specified abnormal findings of blood chemistry: Secondary | ICD-10-CM | POA: Diagnosis not present

## 2024-03-05 ENCOUNTER — Ambulatory Visit: Admitting: Internal Medicine

## 2024-03-05 DIAGNOSIS — R002 Palpitations: Secondary | ICD-10-CM | POA: Diagnosis not present

## 2024-03-05 DIAGNOSIS — G4733 Obstructive sleep apnea (adult) (pediatric): Secondary | ICD-10-CM | POA: Diagnosis not present

## 2024-03-07 ENCOUNTER — Ambulatory Visit: Admitting: Internal Medicine

## 2024-03-07 ENCOUNTER — Encounter: Payer: Self-pay | Admitting: Internal Medicine

## 2024-03-07 VITALS — BP 102/78 | HR 73 | Temp 98.2°F | Ht 74.0 in | Wt 299.0 lb

## 2024-03-07 DIAGNOSIS — K760 Fatty (change of) liver, not elsewhere classified: Secondary | ICD-10-CM | POA: Insufficient documentation

## 2024-03-07 NOTE — Assessment & Plan Note (Signed)
 Mild I suspect a concurrent viral illness earlier this month caused his main symptoms He feels better overall off alcohol--though likely it didn't cause the LFT elevations [facility] to work on weight loss

## 2024-03-07 NOTE — Progress Notes (Signed)
 Subjective:    Patient ID: William Stuart, male    DOB: 15-Dec-1980, 43 y.o.   MRN: [id number]  HPI Here for follow up of abdominal pain and liver test abnormalities  Woke 7/13 feeling awful Felt dehydrated and bad--heart felt fast and some sense of SOB Some vague abdominal pain Did stop drinking alcohol---this has helped him some Still gets some discomfort with movement or twisting  No N/V Eating okay Weight down a little  Elevated LFTs and ultrasound with mild steatosis noted  Current Outpatient Medications on File Prior to Visit  Medication Sig Dispense Refill   metoprolol  succinate (TOPROL -XL) 25 MG 24 hr tablet Take 25 mg by mouth daily.     omeprazole  (PRILOSEC) 20 MG capsule Take 1 capsule (20 mg total) by mouth 2 (two) times daily before a meal. 180 capsule 3   No current facility-administered medications on file prior to visit.    No Known Allergies  Past Medical History:  Diagnosis Date   Allergy    Anxiety    Asthma    Dysrhythmia    GERD (gastroesophageal reflux disease)     Past Surgical History:  Procedure Laterality Date   COLONOSCOPY WITH PROPOFOL  N/A 04/20/2023   Procedure: COLONOSCOPY WITH PROPOFOL ;  Surgeon: Jinny Carmine, MD;  Location: [facility];  Service: Endoscopy;  Laterality: N/A;   ESOPHAGOGASTRODUODENOSCOPY (EGD) WITH PROPOFOL  N/A 04/20/2023   Procedure: ESOPHAGOGASTRODUODENOSCOPY (EGD) WITH PROPOFOL ;  Surgeon: Jinny Carmine, MD;  Location: [facility];  Service: Endoscopy;  Laterality: N/A;   ORCHIECTOMY     L orchiectomy and R orchiopexy for torsion   2002   PILONIDAL CYST EXCISION  2000 & 2001   3 times    Family History  Problem Relation Age of Onset   Anxiety disorder Mother    Migraines Mother    Celiac disease Mother    Crohn's disease Mother    Anxiety disorder Father    Migraines Father    Celiac disease Father    Anxiety disorder Sister    Alcohol abuse Paternal Grandfather    Cancer Neg Hx    Colon cancer Neg  Hx    Esophageal cancer Neg Hx    Pancreatic cancer Neg Hx    Rectal cancer Neg Hx    Stomach cancer Neg Hx     Social History   Socioeconomic History   Marital status: Married    Spouse name: alyssa   Number of children: 2   Years of education: Not on file   Highest education level: Master's degree (e.g., MA, MS, MEng, MEd, MSW, MBA)  Occupational History   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    Employer: [employer]  Tobacco Use   Smoking status: Never    Passive exposure: Never   Smokeless tobacco: Never  Vaping Use   Vaping status: Never Used  Substance and Sexual Activity   Alcohol use: Yes    Alcohol/week: 25.0 standard drinks of alcohol    Types: 21 Cans of beer, 4 Shots of liquor per week    Comment: occasional   Drug use: No   Sexual activity: Yes  Other Topics Concern   Not on file  Social History Narrative   Not on file   Social Drivers of Health   Financial Resource Strain: Low Risk  (03/05/2024)   Overall Financial Resource Strain (CARDIA)    Difficulty of Paying Living Expenses: Not hard at all  Food Insecurity: No Food Insecurity (03/05/2024)   Hunger  Vital Sign    Worried About Programme researcher, broadcasting/film/video in the Last Year: Never true    Ran Out of Food in the Last Year: Never true  Transportation Needs: No Transportation Needs (03/05/2024)   PRAPARE - Administrator, Civil Service (Medical): No    Lack of Transportation (Non-Medical): No  Physical Activity: Unknown (03/05/2024)   Exercise Vital Sign    Days of Exercise per Week: 3 days    Minutes of Exercise per Session: Not on file  Stress: Stress Concern Present (03/05/2024)   [employer] of Occupational Health - Occupational Stress Questionnaire    Feeling of Stress: Rather much  Social Connections: Socially Isolated (03/05/2024)   Social Connection and Isolation Panel    Frequency of Communication with Friends and Family: Once a week    Frequency of  Social Gatherings with Friends and Family: Once a week    Attends Religious Services: Never    Database administrator or Organizations: No    Attends Engineer, structural: Not on file    Marital Status: Married  Catering manager Violence: Not on file   Review of Systems No cough or breathing problems One night with diarrhea at the beginning of this---now bowels are back to normal    Objective:   Physical Exam Constitutional:      Appearance: He is well-developed.  Pulmonary:     Effort: Pulmonary effort is normal.     Breath sounds: Normal breath sounds. No wheezing or rales.  Abdominal:     General: Bowel sounds are normal. There is no distension.     Palpations: Abdomen is soft.     Tenderness: There is no abdominal tenderness. There is no guarding or rebound.     Comments: Murphy's negative  Musculoskeletal:     Cervical back: Neck supple.  Lymphadenopathy:     Cervical: No cervical adenopathy.  Neurological:     Mental Status: He is alert.            Assessment & Plan:

## 2024-03-09 ENCOUNTER — Other Ambulatory Visit: Payer: Self-pay | Admitting: Internal Medicine

## 2024-03-11 ENCOUNTER — Ambulatory Visit (INDEPENDENT_AMBULATORY_CARE_PROVIDER_SITE_OTHER): Admitting: Adult Health

## 2024-03-11 ENCOUNTER — Encounter (INDEPENDENT_AMBULATORY_CARE_PROVIDER_SITE_OTHER): Payer: Self-pay | Admitting: Adult Health

## 2024-03-11 VITALS — BP 114/79 | HR 79 | Temp 97.9°F | Ht 74.0 in | Wt 293.0 lb

## 2024-03-11 DIAGNOSIS — R7401 Elevation of levels of liver transaminase levels: Secondary | ICD-10-CM | POA: Diagnosis not present

## 2024-03-11 DIAGNOSIS — R109 Unspecified abdominal pain: Secondary | ICD-10-CM

## 2024-03-11 DIAGNOSIS — K2100 Gastro-esophageal reflux disease with esophagitis, without bleeding: Secondary | ICD-10-CM | POA: Diagnosis not present

## 2024-03-11 DIAGNOSIS — Z91014 Allergy to mammalian meats: Secondary | ICD-10-CM

## 2024-03-11 DIAGNOSIS — K760 Fatty (change of) liver, not elsewhere classified: Secondary | ICD-10-CM | POA: Diagnosis not present

## 2024-03-11 DIAGNOSIS — Z6837 Body mass index (BMI) 37.0-37.9, adult: Secondary | ICD-10-CM

## 2024-03-11 DIAGNOSIS — E669 Obesity, unspecified: Secondary | ICD-10-CM

## 2024-03-11 NOTE — Progress Notes (Signed)
 Office: [phone]  /  Fax: [phone]   Initial Visit    William Stuart was seen in clinic today to evaluate for obesity. He is interested in losing weight to improve overall health and reduce the risk of weight related complications. He presents today to review program treatment options, initial physical assessment, and evaluation.     He was referred by: PCP  When asked what else they would like to accomplish? He states: Adopt a healthier eating pattern and lifestyle, Improve energy levels and physical activity, Improve existing medical conditions, Improve quality of life, and Current Weight 293 lbs, Goal is to become for fit in general, ie: run around  When asked how has your weight affected you? He states: Contributed to medical problems, Contributed to orthopedic problems or mobility issues, Having fatigue, and Having poor endurance  Weight history: Weight gain over the last 5 years- stopped going to [employer] and increased   Highest weight: 310 lbs  Some associated conditions: MASLD, OSA, and GERD  Contributing factors: disruption of circadian rhythm / sleep disordered breathing, consumption of processed foods, use of obesogenic medications: Beta-blockers, moderate to high levels of stress, reduced physical activity, hectic pace of life, and need for convenient foods  Weight promoting medications identified: Beta-blockers  Prior weight loss attempts: Low Carb and Tracking and Journaling  Current nutrition plan: None  Current level of physical activity: Walking 20 minutes, three a week  Current or previous pharmacotherapy: None  Response to medication: Never tried medications and Not interested in AOMs   Past medical history includes:   Past Medical History:  Diagnosis Date   Allergy    Anxiety    Asthma    Dysrhythmia    GERD (gastroesophageal reflux disease)      Objective    BP 114/79   Pulse 79   Temp 97.9 F (36.6 C)   Ht 6' 2 (1.88 m)   Wt 293  lb (132.9 kg)   SpO2 97%   BMI 37.62 kg/m  He was weighed on the bioimpedance scale: Body mass index is 37.62 kg/m.  Body Fat%:35.3, Visceral Fat Rating:18, Weight trend over the last 12 months: Decreasing  General:  Alert, oriented and cooperative. Patient is in no acute distress.  Respiratory: Normal respiratory effort, no problems with respiration noted   Gait: able to ambulate independently  Mental Status: Normal mood and affect. Normal behavior. Normal judgment and thought content.   DIAGNOSTIC DATA REVIEWED:  BMET    Component Value Date/Time   NA 136 02/27/2024 0836   NA 135 03/09/2023 1155   K 4.3 02/27/2024 0836   CL 101 02/27/2024 0836   CO2 29 02/27/2024 0836   GLUCOSE 92 02/27/2024 0836   BUN 10 02/27/2024 0836   BUN 10 03/09/2023 1155   CREATININE 0.97 02/27/2024 0836   CALCIUM 9.1 02/27/2024 0836   GFRNONAA >60 09/06/2021 0947   GFRAA 97 08/05/2020 1208   Lab Results  Component Value Date   HGBA1C 5.3 01/19/2024   HGBA1C 5.2 11/03/2016   No results found for: INSULIN CBC    Component Value Date/Time   WBC 5.4 02/27/2024 0836   RBC 4.48 02/27/2024 0836   HGB 11.0 (L) 02/27/2024 0836   HGB 11.3 (L) 03/17/2023 1532   HCT 34.9 (L) 02/27/2024 0836   HCT 35.8 (L) 03/17/2023 1532   PLT 227.0 02/27/2024 0836   PLT 321 03/17/2023 1532   MCV 78.1 02/27/2024 0836   MCV 83 03/17/2023 1532  MCH 26.0 (L) 03/17/2023 1532   MCH 30.3 09/06/2021 0947   MCHC 31.4 02/27/2024 0836   RDW 18.5 (H) 02/27/2024 0836   RDW 15.7 (H) 03/17/2023 1532   Iron/TIBC/Ferritin/ %Sat    Component Value Date/Time   IRON 18 (L) 02/27/2024 0836   IRON 30 (L) 03/17/2023 1532   TIBC 529.2 (H) 10/10/2023 1117   TIBC 456 (H) 03/17/2023 1532   FERRITIN 14.0 (L) 02/27/2024 0836   FERRITIN 20 (L) 03/17/2023 1532   IRONPCTSAT 9.6 (L) 10/10/2023 1117   IRONPCTSAT 7 (LL) 03/17/2023 1532   Lipid Panel     Component Value Date/Time   CHOL 185 10/21/2020 0958   TRIG 190 (H)  10/21/2020 0958   HDL 57 10/21/2020 0958   CHOLHDL 3.2 10/21/2020 0958   LDLCALC 96 10/21/2020 0958   Hepatic Function Panel     Component Value Date/Time   PROT 7.2 02/27/2024 0836   PROT 7.6 03/09/2023 1155   ALBUMIN 4.2 02/27/2024 0836   ALBUMIN 4.3 03/09/2023 1155   AST 95 (H) 02/27/2024 0836   ALT 169 (H) 02/27/2024 0836   ALKPHOS 53 02/27/2024 0836   BILITOT 0.5 02/27/2024 0836   BILITOT 0.6 03/09/2023 1155   BILIDIR 0.1 02/27/2024 0836      Component Value Date/Time   TSH 1.290 03/09/2023 1155     Assessment and Plan   Hepatic steatosis  Elevated liver transaminase level  Abdominal discomfort  Gastroesophageal reflux disease with esophagitis without hemorrhage  Alpha-gal syndrome  Obesity (BMI 30-39.9), STARTING BMI 37.6   Assessment and Plan          ETABLISH WITH HWW   Obesity Treatment / Action Plan:  Patient will work on garnering support from family and friends to begin weight loss journey. Will work on eliminating or reducing the presence of highly palatable, calorie dense foods in the home. Will complete provided nutritional and psychosocial assessment questionnaire before the next appointment. Will be scheduled for indirect calorimetry to determine resting energy expenditure in a fasting state.  This will allow us  to create a reduced calorie, high-protein meal plan to promote loss of fat mass while preserving muscle mass. Will think about ideas on how to incorporate physical activity into their daily routine. Counseled on the health benefits of losing 5%-15% of total body weight. Was counseled on nutritional approaches to weight loss and benefits of reducing processed foods and consuming plant-based foods and high quality protein as part of nutritional weight management. Was counseled on pharmacotherapy and role as an adjunct in weight management.   Obesity Education Performed Today:  He was weighed on the bioimpedance scale and results were  discussed and documented in the synopsis.  We discussed obesity as a disease and the importance of a more detailed evaluation of all the factors contributing to the disease.  We discussed the importance of long term lifestyle changes which include nutrition, exercise and behavioral modifications as well as the importance of customizing this to his specific health and social needs.  We discussed the benefits of reaching a healthier weight to alleviate the symptoms of existing conditions and reduce the risks of the biomechanical, metabolic and psychological effects of obesity.  We reviewed the four pillars of obesity medicine and importance of using a multimodal approach.  We reviewed the basic principles in weight management.   KAVEON BLATZ appears to be in the action stage of change and states they are ready to start intensive lifestyle modifications and behavioral modifications.  I have spent 30 minutes in the care of the patient today including: 5 minutes before the visit reviewing and preparing the chart. 20 minutes face-to-face assessing and reviewing listed medical problems as outlined in obesity care plan, providing nutritional and behavioral counseling on topics outlined in the obesity care plan, counseling regarding anti-obesity medication as outlined in obesity care plan, independently interpreting test results and goals of care, as described in assessment and plan, reviewing and discussing biometric information and progress, and Reviewed recent imaging and Specialist/PCP notes, ie: Hepatic Steatosis 5 minutes after the visit updating chart and documentation of encounter.  Reviewed by clinician on day of visit: allergies, medications, problem list, medical history, surgical history, family history, social history, and previous encounter notes pertinent to obesity diagnosis.  Kaelum Kissick d. Carron Mcmurry, NP-C

## 2024-03-13 DIAGNOSIS — Z0289 Encounter for other administrative examinations: Secondary | ICD-10-CM

## 2024-03-14 ENCOUNTER — Institutional Professional Consult (permissible substitution) (INDEPENDENT_AMBULATORY_CARE_PROVIDER_SITE_OTHER): Admitting: Nurse Practitioner

## 2024-03-21 ENCOUNTER — Ambulatory Visit (INDEPENDENT_AMBULATORY_CARE_PROVIDER_SITE_OTHER): Admitting: Family Medicine

## 2024-03-21 ENCOUNTER — Encounter (INDEPENDENT_AMBULATORY_CARE_PROVIDER_SITE_OTHER): Payer: Self-pay | Admitting: Family Medicine

## 2024-03-21 VITALS — BP 115/79 | HR 62 | Temp 97.8°F | Ht 74.0 in | Wt 296.0 lb

## 2024-03-21 DIAGNOSIS — R0602 Shortness of breath: Secondary | ICD-10-CM | POA: Diagnosis not present

## 2024-03-21 DIAGNOSIS — Z1331 Encounter for screening for depression: Secondary | ICD-10-CM | POA: Diagnosis not present

## 2024-03-21 DIAGNOSIS — K219 Gastro-esophageal reflux disease without esophagitis: Secondary | ICD-10-CM

## 2024-03-21 DIAGNOSIS — E669 Obesity, unspecified: Secondary | ICD-10-CM | POA: Diagnosis not present

## 2024-03-21 DIAGNOSIS — G4733 Obstructive sleep apnea (adult) (pediatric): Secondary | ICD-10-CM

## 2024-03-21 DIAGNOSIS — K760 Fatty (change of) liver, not elsewhere classified: Secondary | ICD-10-CM

## 2024-03-21 DIAGNOSIS — Z6837 Body mass index (BMI) 37.0-37.9, adult: Secondary | ICD-10-CM

## 2024-03-21 DIAGNOSIS — R5383 Other fatigue: Secondary | ICD-10-CM

## 2024-03-21 DIAGNOSIS — R002 Palpitations: Secondary | ICD-10-CM

## 2024-03-21 DIAGNOSIS — F411 Generalized anxiety disorder: Secondary | ICD-10-CM

## 2024-03-21 NOTE — Progress Notes (Signed)
 William Stuart, D.O.  ABFM, ABOM Specializing in Clinical Bariatric Medicine Office located at: [address]  [place], [place]  [address]   Bariatric Medicine Visit  Dear William Charlie FERNS, MD   Thank you for referring William Stuart to our clinic today for evaluation.  We performed a consultation to discuss his options for treatment and educate the patient on his disease state.  The following note includes my evaluation and treatment recommendations.   Please do not hesitate to reach out to me directly if you have any further concerns.    Assessment and Plan:   Orders Placed This Encounter  Procedures   Lipid panel   Insulin , random   Comprehensive metabolic panel with GFR   EKG 87-Ozji    Labs obtained today (insulin , lipid panel, CMP w GFR) will be reviewed at next OV.    FOR THE DISEASE OF OBESITY:  Obesity (BMI 30-39.9), STARTING BMI 37.99 Date 03/21/24 Obesity current 37.99 Assessment & Plan: William Stuart is currently in the action stage of change. As such, his goal is to start our weight management plan.  He has agreed to implement: START Category 3 Meal Plan with only 200 snack calories   Behavioral Intervention We discussed the following Behavioral Modification Strategies today:  Begin working on maintaining a reduced calorie state, getting the recommended amount of protein, incorporating whole foods, making healthy choices, staying well hydrated and practicing mindfulness when eating., and focusing on food with a 10:1 ratio of calories: grams of protein, planning for success.  Additional resources provided today: Handout on CAT 3 meal plan  Evidence-based interventions for health behavior change were utilized today including the discussion of self monitoring techniques, problem-solving barriers and SMART goal setting techniques.    Pt will specifically work on: implementation of his prescribed nutritional meal plan!     Recommended Physical Activity  Goals William Stuart has been advised to work up to 300-450  minutes of moderate intensity aerobic activity a week and strengthening exercises 2-3 times per week for cardiovascular health, weight loss maintenance and preservation of muscle mass.   He has agreed to : maintain current level of activity.    Pharmacotherapy We discussed various medication options to help William Stuart with his weight loss efforts and we both agreed to: Begin nutritional and behavioral strategies   ASSOCIATED CONDITIONS ADDRESSED TODAY:  Fatigue  Assessment & Plan: William Stuart does feel that his weight is causing his energy to be lower than it should be. Fatigue may be related to obesity, depression or many other causes. he does not appear to have any red flag symptoms and this appears to most likely be related to his current lifestyle habits and dietary intake.  Labs will be ordered and reviewed with him at their next office visit in two weeks.  Within the last couple of months pt had labs obtained by his PCP including: CMP, lipase, CBC, ferritin and iron, B12, folate, A1c, vit D, and magnesium   Had other labs recently except :  Lipid panel, fasting insulin     Epworth sleepiness scale is 8 and appears to be within normal limits. William Stuart reports daytime somnolence and admits to waking up still tired. Patient has a history of symptoms of daytime fatigue, morning fatigue, and hx of OSA. William Stuart generally gets 6 hours of sleep per night, and states that he has generally poor sleep quality (waking up tired). Snoring is present. Apneic episodes are present.   ECG: Performed and reviewed/ interpreted independently  today. Sinus bradycardia, rate 56 bpm; reassuring without any acute abnormalities, will continue to monitor for symptoms    Shortness of breath on exertion Assessment & Plan: William Stuart does feel that he gets out of breath more easily than he used to when he exercises and seems to be worsening over time with weight gain.  This has  gotten worse recently. [name] denies shortness of breath at rest or orthopnea. Pt denies chest pain, dizziness, heart palpitations, or excessive diaphoresis or nausea with activity.  This is not new and is ongoing.  William Stuart's shortness of breath appears to be obesity related and exercise induced, as they do not appear to have any red flag symptoms/ concerns today.  Also, this condition appears to be related to a state of poor cardiovascular conditioning   Obtain labs today and will be reviewed with him at their next office visit in two weeks.  Indirect Calorimeter completed today to help guide our dietary regimen. It shows a VO2 of 253 and a REE of 1743.  His calculated basal metabolic rate is 7313 thus his resting energy expenditure is worse than expected. Of note, pt states he is uncertain if he had a good seal on the IC mask today.   Patient agreed to work on weight loss at this time.  As William Stuart progresses through our weight loss program, we will gradually increase exercise as tolerated to treat his current condition.   If William Stuart follows our recommendations and loses 5-10% of their weight without improvement of his shortness of breath or if at any time, symptoms become more concerning, they agree to urgently follow up with their PCP/ specialist for further consideration/ evaluation.   William Stuart verbalizes agreement with this plan.    Depression Screen  Assessment & Plan: His PHQ-9 score was 9. When asked how the items included on PHQ-9 have made it for him to do his work, take care of things at home, or get along with other people, he stated not difficult at all. Denies any emotional eating. Per New Patient Packet, he reports eating when he is bored.   Counseling on the importance of stress management, exercise, and self-care activities like meditation and adequate sleep (7-9 hrs/night). Advised pt on the importance of following their prudent nutritional meal plan and how food can affect moods in addition  to emotional eating.     GAD (generalized anxiety disorder) Assessment & Plan: Moods are currently stable. Pt has been on Sertraline  in the past; not currently taking. Last refilled by PCP, Dr. Letvak, for 50 mg in 10/2023. Pt report a hx of alcohol abuse. Of note, pt reports he has stopped drinking as of 02/17/24. Denies any SI/HI.   Continue following up with PCP. Reviewed how mood disorders may effect moods and make weight loss more challenging. Encouraged exercise as a stress management strategy. Stressed the importance of adequate sleep; improve sleep hygiene and aim for 7-9 hours/night. Recommend he find a Patent examiner. Consider seeking resources through EA from his employer. Recommend continued alcohol cessation. Will continue monitoring condition as it relates to his wt loss journey.     Gastroesophageal reflux disease without esophagitis Assessment & Plan: Has had a colonoscopy and an endoscopy in 06/2023. Pt reports no bleeds, ulcers, or polyps were found. He was lost to follow up afterwards. Has hx of anemia. Currently on Omeprazole  20 mg once daily with good compliance and tolerance. No acute concerns.   Continue with current medication regimen as advised. Avoid known trigger  foods. Reviewed how trigger foods (spicy, acidic, fatty foods) may contribute to increased GERD sx. Work on following nutritional meal plan prescribed today; eat more whole foods, increase protein, and high-fat foods. Will continue monitoring condition alongside PCP/specialists.     OSA (obstructive sleep apnea) Assessment & Plan: Not using CPAP or BiPAP. Had sleep study done through [facility]. He recalls having at least 2 sleep studies in the past. Used CPAP after initial diagnosed. He reports his sleep quality worsened, and did not tolerate the mask well. He changed to the nasal pillow mask, better but still not great.   01/13/17 CPAP TITRATION INTERPRETATION IMPRESSION: Mild to Severe Obstructive  Sleep Apnea, Adult [G47.33] (From baseline PSG 11/09/16: Total AHI 6.4/hr, REM AHI 24.4/hr, REM/Supine 30.7/hr, Longest apnea 22.7 sec, Desats up to 90.7%)  Encouraged pt to aim for recommended 7-9 hours/night. Follow up with sleep medicine specialists for other options of treatment or other masks. Reviewed the risks of poorly controlled OSA and poor sleep quality -  this may effect moods/anxiety, cognitive function, makes weight loss more challenging, and contribute to worsening of other chronic conditions.      Metabolic dysfunction-associated steatotic liver disease (MASLD) Assessment & Plan: Poorly controlled. Had abdominal US  on 7/28/025, which showed mild hepatic steatosis with no other no acute findings. Per US  results: Mild increased echogenicity of the liver parenchyma is compatible with hepatic steatosis. No intrahepatic biliary ductal dilatation. No mass. Pt has a hx of alcohol abuse. He last drink alcohol the day prior to latest liver labs being obtained. Visceral fat rating was 18 on 03/11/24 and 19 today. Reviewed his labs, which suggest MASLD is obesity-related.   Ideal goal of visceral fat rating <10 for males. Recommend continued alcohol cessation. Avoid use of NSAIDs. Reviewed how weight loss is expected with alcohol cessation. Begin working on nutritional meal plan; decreasing simple carbs, eating more whole foods, and increasing protein intake. Properly hydrate by drinking 1/2 his body weight in ounces of water per day. All questions answered and addressed. Continue monitoring alongside PCP/specialists.     Palpitations Assessment & Plan: Hx of palpitations. He wore heart monitor for 1 day, on 6/16.  Echo done in 2019 showed diminished EF of 45-50%.  More recently on 02/05/24 was showed mild LVH and EF >55%. Cardiac CT and his score was 0 on 10/2021.  No personal hx of HLD. No fmhx of CAD overall; maternal grandfather did had some age-related heart issues late in life.  He is  established with with Dr. Florencio of cardiology at [facility] in [place], [place]. Currently on Metoprolol  succinate 25 mg once daily, prescribed by cardiology.  He has been able to avoid some triggers (caffeine and dehydration), but admits he does not avoid all of them. He also reports a hx of alcohol abuse. Of note, pt has not had any alcoholic beverages since 02/17/24. Since he quit drinking, he has noticed a decrease in frequency of palpitations.   Continue with current medication regimen as prescribed. I recommend he continue working on getting adequate sleep (7-9 hrs/night), decreasing caffeine intake, and increasing water intake. Follow up with cardiology care team as instructed by them. Will continue monitoring alongside PCP/specialists.     FOLLOW UP:   Follow up in 2 weeks. He was informed of the importance of frequent follow up visits to maximize his success with intensive lifestyle modifications for his multiple health conditions.  William Stuart is aware that we will review all of his lab  results at our next visit.  He is aware that if anything is critical/ life threatening with the results, we will be contacting him via MyChart prior to the office visit to discuss management.    Chief Complaint:   OBESITY GENESIS PAGET (MR# [id number]) is a pleasant 43 y.o. male who presents for evaluation and treatment of obesity and related comorbidities. Current BMI is Body mass index is 38 kg/m. William Stuart has been struggling with his weight for many years and has been unsuccessful in either losing weight, maintaining weight loss, or reaching his healthy weight goal.  William Stuart is currently in the action stage of change and ready to dedicate time achieving and maintaining a healthier weight. William Stuart is interested in becoming our patient and working on intensive lifestyle modifications including (but not limited to) diet and exercise for weight loss.  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 Patient is married to wife, William Stuart and has 2 children. He lives with his wife, William Stuart, and his 2 sons, ages 60 and 3.   Walks 15-20 minutes 3x/week.  Desired weight:  200-220 lbs in 2-3 yrs .  Reasons he desires to lose weight: quality of life.  Peak weight: 310 lbs.  Past diets: Paleo + [employer] - no diet plan has truly worked.  Eating outside the home 5-12 times per week.  Eating fast food/take out: 5x/week.  Cravings: ice cream .  Snacks on chips, candies, crackers.  Occasionally skips breakfast.  Previously would drink 3-4 drinks (beer/rum) every night. Has not had any alcohol since 02/17/24   Worst food habit: skipping meals, snacking on candies at work, and drinking diet sodas.    Subjective:   This is the patient's first visit at [facility].  The patient's NEW PATIENT PACKET that they filled out prior to today's office visit was reviewed at length and information from that paperwork was included within the following office visit note.    Included in the packet: current and past health history, medications, allergies, ROS, gynecologic history (women only), surgical history, family history, social history, weight history, weight loss surgery history (for those that have had weight loss surgery), nutritional evaluation, mood and food questionnaire along with a depression screening (PHQ9) on all patients, an Epworth questionnaire, sleep habits questionnaire, patient life and health improvement goals questionnaire. These will all be scanned into the patient's chart under the media tab.   Review of Systems: Please refer to new patient packet scanned into media. Pertinent positives were addressed with patient today.  Reviewed by clinician on day of visit: allergies, medications, problem list, medical history, surgical history, family history, social history, and previous encounter  notes.  During the visit, I independently reviewed the patient's EKG, bioimpedance scale results, and indirect calorimeter results. I used this information to tailor a meal plan for the patient that will help William Stuart to lose weight and will improve his obesity-related conditions going forward.  I performed a medically necessary appropriate examination and/or evaluation. I discussed the assessment and treatment plan with the patient. The patient was provided an opportunity to ask questions and all were answered. The patient agreed with the plan and demonstrated an understanding of the instructions. Labs were ordered today (unless patient declined them) and will be reviewed with the patient at our next visit unless more critical results need to be addressed immediately. Clinical information was updated and documented  in the EMR.    Objective:   PHYSICAL EXAM: Blood pressure 115/79, pulse 62, temperature 97.8 F (36.6 C), height 6' 2 (1.88 m), weight 296 lb (134.3 kg), SpO2 97%. Body mass index is 38 kg/m.  General: Well Developed, well nourished, and in no acute distress.  HEENT: Normocephalic, atraumatic; EOMI, sclerae are anicteric. Skin: Warm and dry, good turgor Chest:  Normal excursion, shape, no gross ABN Respiratory: No conversational dyspnea; speaking in full sentences NeuroM-Sk:  Normal gross ROM * 4 extremities  Psych: A and O *3, insight adequate, mood- full   Anthropometric Measurements Height: 6' 2 (1.88 m) Weight: 296 lb (134.3 kg) BMI (Calculated): 37.99 Weight at Last Visit: NA Weight Lost Since Last Visit: NA Weight Gained Since Last Visit: NA Starting Weight: 296lb Total Weight Loss (lbs): 0 lb (0 kg) (NA) Peak Weight: 310lb Waist Measurement : 49 inches   Body Composition  Body Fat %: 35.1 % Fat Mass (lbs): 103.8 lbs Muscle Mass (lbs): 182.8 lbs Total Body Water (lbs): 137.2 lbs Visceral Fat Rating : 19   Other Clinical Data RMR: [id number] Fasting:  yes Labs: Yes Today's Visit #: New Pt. Starting Date: 03/21/24 Comments: New Pt.    DIAGNOSTIC DATA REVIEWED:  BMET    Component Value Date/Time   NA 136 02/27/2024 0836   NA 135 03/09/2023 1155   K 4.3 02/27/2024 0836   CL 101 02/27/2024 0836   CO2 29 02/27/2024 0836   GLUCOSE 92 02/27/2024 0836   BUN 10 02/27/2024 0836   BUN 10 03/09/2023 1155   CREATININE 0.97 02/27/2024 0836   CALCIUM 9.1 02/27/2024 0836   GFRNONAA >60 09/06/2021 0947   GFRAA 97 08/05/2020 1208   Lab Results  Component Value Date   HGBA1C 5.3 01/19/2024   HGBA1C 5.2 11/03/2016   No results found for: INSULIN  Lab Results  Component Value Date   TSH 1.290 03/09/2023   CBC    Component Value Date/Time   WBC 5.4 02/27/2024 0836   RBC 4.48 02/27/2024 0836   HGB 11.0 (L) 02/27/2024 0836   HGB 11.3 (L) 03/17/2023 1532   HCT 34.9 (L) 02/27/2024 0836   HCT 35.8 (L) 03/17/2023 1532   PLT 227.0 02/27/2024 0836   PLT 321 03/17/2023 1532   MCV 78.1 02/27/2024 0836   MCV 83 03/17/2023 1532   MCH 26.0 (L) 03/17/2023 1532   MCH 30.3 09/06/2021 0947   MCHC 31.4 02/27/2024 0836   RDW 18.5 (H) 02/27/2024 0836   RDW 15.7 (H) 03/17/2023 1532   Iron Studies    Component Value Date/Time   IRON 18 (L) 02/27/2024 0836   IRON 30 (L) 03/17/2023 1532   TIBC 529.2 (H) 10/10/2023 1117   TIBC 456 (H) 03/17/2023 1532   FERRITIN 14.0 (L) 02/27/2024 0836   FERRITIN 20 (L) 03/17/2023 1532   IRONPCTSAT 9.6 (L) 10/10/2023 1117   IRONPCTSAT 7 (LL) 03/17/2023 1532   Lipid Panel     Component Value Date/Time   CHOL 185 10/21/2020 0958   TRIG 190 (H) 10/21/2020 0958   HDL 57 10/21/2020 0958   CHOLHDL 3.2 10/21/2020 0958   LDLCALC 96 10/21/2020 0958   Hepatic Function Panel     Component Value Date/Time   PROT 7.2 02/27/2024 0836   PROT 7.6 03/09/2023 1155   ALBUMIN 4.2 02/27/2024 0836   ALBUMIN 4.3 03/09/2023 1155   AST 95 (H) 02/27/2024 0836   ALT 169 (H) 02/27/2024 0836   ALKPHOS 53 02/27/2024 0836  BILITOT 0.5 02/27/2024 0836   BILITOT 0.6 03/09/2023 1155   BILIDIR 0.1 02/27/2024 0836      Component Value Date/Time   TSH 1.290 03/09/2023 1155   Nutritional Lab Results  Component Value Date   VD25OH 20.4 (L) 01/19/2024   VD25OH 24.3 (L) 03/09/2023   VD25OH 18.7 (L) 10/21/2020    Attestation Statements:   LILLETTE William Stuart, acting as a medical scribe for William Jenkins, DO., have compiled all relevant documentation for today's office visit on behalf of William Jenkins, DO, while in the presence of [employer], DO.  I have spent 65 minutes in the care of the patient today including: 54 minutes face-to-face assessing and reviewing listed medical problems above as outlined in office visit note, providing nutritional and behavioral counseling as outlined in obesity care plan, independently interpreting results and goals of care, see listed medical problems, and discussing biometric information and progress.   I have reviewed the above documentation for accuracy and completeness, and I agree with the above. William JINNY Stuart, D.O.  The 21st Century Cures Act was signed into law in 2016 which includes the topic of electronic health records.  This provides immediate access to information in MyChart.  This includes consultation notes, operative notes, office notes, lab results and pathology reports.  If you have any questions about what you read please let us  know at your next visit so we can discuss your concerns and take corrective action if need be.  We are right here with you.

## 2024-03-22 LAB — COMPREHENSIVE METABOLIC PANEL WITH GFR
ALT: 55 IU/L — ABNORMAL HIGH (ref 0–44)
AST: 37 IU/L (ref 0–40)
Albumin: 4.4 g/dL (ref 4.1–5.1)
Alkaline Phosphatase: 71 IU/L (ref 44–121)
BUN/Creatinine Ratio: 10 (ref 9–20)
BUN: 10 mg/dL (ref 6–24)
Bilirubin Total: 0.4 mg/dL (ref 0.0–1.2)
CO2: 20 mmol/L (ref 20–29)
Calcium: 9.3 mg/dL (ref 8.7–10.2)
Chloride: 101 mmol/L (ref 96–106)
Creatinine, Ser: 0.97 mg/dL (ref 0.76–1.27)
Globulin, Total: 3.4 g/dL (ref 1.5–4.5)
Glucose: 87 mg/dL (ref 70–99)
Potassium: 4.9 mmol/L (ref 3.5–5.2)
Sodium: 135 mmol/L (ref 134–144)
Total Protein: 7.8 g/dL (ref 6.0–8.5)
eGFR: 100 mL/min/1.73 (ref >59)

## 2024-03-22 LAB — LIPID PANEL
Chol/HDL Ratio: 4 ratio (ref 0.0–5.0)
Cholesterol, Total: 170 mg/dL (ref 100–199)
HDL: 42 mg/dL (ref >39)
LDL Chol Calc (NIH): 112 mg/dL — ABNORMAL HIGH (ref 0–99)
Triglycerides: 85 mg/dL (ref 0–149)
VLDL Cholesterol Cal: 16 mg/dL (ref 5–40)

## 2024-03-22 LAB — INSULIN, RANDOM: INSULIN: 6.5 u[IU]/mL (ref 2.6–24.9)

## 2024-04-23 ENCOUNTER — Ambulatory Visit (INDEPENDENT_AMBULATORY_CARE_PROVIDER_SITE_OTHER): Admitting: Family Medicine

## 2024-05-01 ENCOUNTER — Ambulatory Visit (INDEPENDENT_AMBULATORY_CARE_PROVIDER_SITE_OTHER): Admitting: Family Medicine

## 2024-05-07 ENCOUNTER — Ambulatory Visit (INDEPENDENT_AMBULATORY_CARE_PROVIDER_SITE_OTHER): Admitting: Family Medicine

## 2024-09-10 ENCOUNTER — Encounter

## 2024-09-11 ENCOUNTER — Other Ambulatory Visit: Payer: Self-pay

## 2024-09-11 MED ORDER — OMEPRAZOLE 20 MG PO CPDR: 20.0000 mg | DELAYED_RELEASE_CAPSULE | Freq: Every day | ORAL | 0 refills | Status: AC

## 2024-09-11 NOTE — Telephone Encounter (Signed)
 SABRA

## 2024-10-30 ENCOUNTER — Encounter
